# Patient Record
Sex: Female | Born: 1965 | Hispanic: No | Smoking: Never smoker
Health system: Southern US, Community
[De-identification: ages and names within clinical notes are randomized; demographics above are authoritative.]

## PROBLEM LIST (undated history)

## (undated) DIAGNOSIS — Z8489 Family history of other specified conditions: Secondary | ICD-10-CM

## (undated) DIAGNOSIS — T8859XA Other complications of anesthesia, initial encounter: Secondary | ICD-10-CM

## (undated) DIAGNOSIS — J45909 Unspecified asthma, uncomplicated: Secondary | ICD-10-CM

## (undated) DIAGNOSIS — M797 Fibromyalgia: Secondary | ICD-10-CM

## (undated) DIAGNOSIS — D691 Qualitative platelet defects: Secondary | ICD-10-CM

## (undated) DIAGNOSIS — K912 Postsurgical malabsorption, not elsewhere classified: Secondary | ICD-10-CM

## (undated) DIAGNOSIS — F419 Anxiety disorder, unspecified: Secondary | ICD-10-CM

## (undated) DIAGNOSIS — M359 Systemic involvement of connective tissue, unspecified: Secondary | ICD-10-CM

## (undated) DIAGNOSIS — R918 Other nonspecific abnormal finding of lung field: Secondary | ICD-10-CM

## (undated) DIAGNOSIS — J189 Pneumonia, unspecified organism: Secondary | ICD-10-CM

## (undated) DIAGNOSIS — F329 Major depressive disorder, single episode, unspecified: Secondary | ICD-10-CM

## (undated) DIAGNOSIS — G43509 Persistent migraine aura without cerebral infarction, not intractable, without status migrainosus: Secondary | ICD-10-CM

## (undated) DIAGNOSIS — D649 Anemia, unspecified: Secondary | ICD-10-CM

## (undated) DIAGNOSIS — F3181 Bipolar II disorder: Secondary | ICD-10-CM

## (undated) DIAGNOSIS — M1990 Unspecified osteoarthritis, unspecified site: Secondary | ICD-10-CM

## (undated) DIAGNOSIS — I10 Essential (primary) hypertension: Secondary | ICD-10-CM

## (undated) DIAGNOSIS — F32A Depression, unspecified: Secondary | ICD-10-CM

## (undated) HISTORY — PX: GASTRIC BYPASS: SHX52

## (undated) HISTORY — PX: LAPAROSCOPIC OOPHERECTOMY: SHX6507

## (undated) HISTORY — PX: TONSILLECTOMY: SUR1361

## (undated) HISTORY — PX: ABDOMINAL HYSTERECTOMY: SHX81

## (undated) HISTORY — PX: FOOT SURGERY: SHX648

## (undated) HISTORY — PX: HERNIA REPAIR: SHX51

## (undated) HISTORY — PX: ESOPHAGOGASTRODUODENOSCOPY: SHX1529

## (undated) HISTORY — PX: TUBAL LIGATION: SHX77

## (undated) HISTORY — PX: HIP SURGERY: SHX245

## (undated) HISTORY — PX: SHOULDER SURGERY: SHX246

## (undated) HISTORY — PX: KNEE SURGERY: SHX244

---

## 1997-09-24 DIAGNOSIS — C801 Malignant (primary) neoplasm, unspecified: Secondary | ICD-10-CM

## 1997-09-24 HISTORY — DX: Malignant (primary) neoplasm, unspecified: C80.1

## 2013-08-30 ENCOUNTER — Emergency Department: Payer: Self-pay | Admitting: Internal Medicine

## 2013-08-30 LAB — CBC
HCT: 34.4 % — ABNORMAL LOW (ref 35.0–47.0)
HGB: 11.3 g/dL — ABNORMAL LOW (ref 12.0–16.0)
MCH: 28.3 pg (ref 26.0–34.0)
MCHC: 32.8 g/dL (ref 32.0–36.0)
MCV: 86 fL (ref 80–100)
Platelet: 211 10*3/uL (ref 150–440)
RBC: 3.99 10*6/uL (ref 3.80–5.20)
RDW: 15.1 % — ABNORMAL HIGH (ref 11.5–14.5)
WBC: 6.5 10*3/uL (ref 3.6–11.0)

## 2013-08-30 LAB — PROTIME-INR
INR: 0.9
Prothrombin Time: 12.6 secs (ref 11.5–14.7)

## 2013-08-30 LAB — BASIC METABOLIC PANEL
Anion Gap: 6 — ABNORMAL LOW (ref 7–16)
BUN: 15 mg/dL (ref 7–18)
Calcium, Total: 8.6 mg/dL (ref 8.5–10.1)
Chloride: 103 mmol/L (ref 98–107)
Co2: 30 mmol/L (ref 21–32)
Creatinine: 0.71 mg/dL (ref 0.60–1.30)
EGFR (African American): >60
EGFR (Non-African Amer.): >60
Glucose: 91 mg/dL (ref 65–99)
Osmolality: 278 (ref 275–301)
Potassium: 4 mmol/L (ref 3.5–5.1)
Sodium: 139 mmol/L (ref 136–145)

## 2015-04-04 ENCOUNTER — Encounter: Payer: Self-pay | Admitting: Emergency Medicine

## 2015-04-04 ENCOUNTER — Ambulatory Visit
Admission: EM | Admit: 2015-04-04 | Discharge: 2015-04-04 | Disposition: A | Discharge status: Home / Self Care | Payer: Worker's Compensation | Attending: Internal Medicine | Admitting: Internal Medicine

## 2015-04-04 DIAGNOSIS — L089 Local infection of the skin and subcutaneous tissue, unspecified: Secondary | ICD-10-CM | POA: Diagnosis not present

## 2015-04-04 DIAGNOSIS — W57XXXA Bitten or stung by nonvenomous insect and other nonvenomous arthropods, initial encounter: Principal | ICD-10-CM

## 2015-04-04 DIAGNOSIS — J309 Allergic rhinitis, unspecified: Secondary | ICD-10-CM

## 2015-04-04 DIAGNOSIS — S80861A Insect bite (nonvenomous), right lower leg, initial encounter: Secondary | ICD-10-CM

## 2015-04-04 DIAGNOSIS — H6593 Unspecified nonsuppurative otitis media, bilateral: Secondary | ICD-10-CM

## 2015-04-04 HISTORY — DX: Fibromyalgia: M79.7

## 2015-04-04 HISTORY — DX: Major depressive disorder, single episode, unspecified: F32.9

## 2015-04-04 HISTORY — DX: Unspecified osteoarthritis, unspecified site: M19.90

## 2015-04-04 HISTORY — DX: Anxiety disorder, unspecified: F41.9

## 2015-04-04 HISTORY — DX: Depression, unspecified: F32.A

## 2015-04-04 HISTORY — DX: Bipolar II disorder: F31.81

## 2015-04-04 MED ORDER — FLUTICASONE PROPIONATE 50 MCG/ACT NA SUSP: 1.0000 | Freq: Two times a day (BID) | NASAL | Status: AC

## 2015-04-04 MED ORDER — MUPIROCIN 2 % EX OINT: 1.0000 "application " | TOPICAL_OINTMENT | Freq: Two times a day (BID) | CUTANEOUS | Status: DC

## 2015-04-04 MED ORDER — CEPHALEXIN 500 MG PO CAPS: 500.0000 mg | ORAL_CAPSULE | Freq: Two times a day (BID) | ORAL | Status: DC

## 2015-04-04 NOTE — Discharge Instructions (Signed)
Allergic Rhinitis Allergic rhinitis is when the mucous membranes in the nose respond to allergens. Allergens are particles in the air that cause your body to have an allergic reaction. This causes you to release allergic antibodies. Through a chain of events, these eventually cause you to release histamine into the blood stream. Although meant to protect the body, it is this release of histamine that causes your discomfort, such as frequent sneezing, congestion, and an itchy, runny nose.  CAUSES  Seasonal allergic rhinitis (hay fever) is caused by pollen allergens that may come from grasses, trees, and weeds. Year-round allergic rhinitis (perennial allergic rhinitis) is caused by allergens such as house dust mites, pet dander, and mold spores.  SYMPTOMS   Nasal stuffiness (congestion).  Itchy, runny nose with sneezing and tearing of the eyes. DIAGNOSIS  Your health care provider can help you determine the allergen or allergens that trigger your symptoms. If you and your health care provider are unable to determine the allergen, skin or blood testing may be used. TREATMENT  Allergic rhinitis does not have a cure, but it can be controlled by:  Medicines and allergy shots (immunotherapy).  Avoiding the allergen. Hay fever may often be treated with antihistamines in pill or nasal spray forms. Antihistamines block the effects of histamine. There are over-the-counter medicines that may help with nasal congestion and swelling around the eyes. Check with your health care provider before taking or giving this medicine.  If avoiding the allergen or the medicine prescribed do not work, there are many new medicines your health care provider can prescribe. Stronger medicine may be used if initial measures are ineffective. Desensitizing injections can be used if medicine and avoidance does not work. Desensitization is when a patient is given ongoing shots until the body becomes less sensitive to the allergen.  Make sure you follow up with your health care provider if problems continue. HOME CARE INSTRUCTIONS It is not possible to completely avoid allergens, but you can reduce your symptoms by taking steps to limit your exposure to them. It helps to know exactly what you are allergic to so that you can avoid your specific triggers. SEEK MEDICAL CARE IF:   You have a fever.  You develop a cough that does not stop easily (persistent).  You have shortness of breath.  You start wheezing.  Symptoms interfere with normal daily activities. Document Released: 06/05/2001 Document Revised: 09/15/2013 Document Reviewed: 05/18/2013 [facility] Patient Information 2015 [place], [place]. This information is not intended to replace advice given to you by your health care provider. Make sure you discuss any questions you have with your health care provider. Otitis Media With Effusion Otitis media with effusion is the presence of fluid in the middle ear. This is a common problem in children, which often follows ear infections. It may be present for weeks or longer after the infection. Unlike an acute ear infection, otitis media with effusion refers only to fluid behind the ear drum and not infection. Children with repeated ear and sinus infections and allergy problems are the most likely to get otitis media with effusion. CAUSES  The most frequent cause of the fluid buildup is dysfunction of the eustachian tubes. These are the tubes that drain fluid in the ears to the back of the nose (nasopharynx). SYMPTOMS   The main symptom of this condition is hearing loss. As a result, you or your child may:  Listen to the TV at a loud volume.  Not respond to questions.  Ask "  what" often when spoken to.  Mistake or confuse one sound or word for another.  There may be a sensation of fullness or pressure but usually not pain. DIAGNOSIS   Your health care provider will diagnose this condition by examining you or your  child's ears.  Your health care provider may test the pressure in you or your child's ear with a tympanometer.  A hearing test may be conducted if the problem persists. TREATMENT   Treatment depends on the duration and the effects of the effusion.  Antibiotics, decongestants, nose drops, and cortisone-type drugs (tablets or nasal spray) may not be helpful.  Children with persistent ear effusions may have delayed language or behavioral problems. Children at risk for developmental delays in hearing, learning, and speech may require referral to a specialist earlier than children not at risk.  You or your child's health care provider may suggest a referral to an ear, nose, and throat surgeon for treatment. The following may help restore normal hearing:  Drainage of fluid.  Placement of ear tubes (tympanostomy tubes).  Removal of adenoids (adenoidectomy). HOME CARE INSTRUCTIONS   Avoid secondhand smoke.  Infants who are breastfed are less likely to have this condition.  Avoid feeding infants while they are lying flat.  Avoid known environmental allergens.  Avoid people who are sick. SEEK MEDICAL CARE IF:   Hearing is not better in 3 months.  Hearing is worse.  Ear pain.  Drainage from the ear.  Dizziness. MAKE SURE YOU:   Understand these instructions.  Will watch your condition.  Will get help right away if you are not doing well or get worse. Document Released: 10/18/2004 Document Revised: 01/25/2014 Document Reviewed: 04/07/2013 [facility] Patient Information 2015 [place], [place]. This information is not intended to replace advice given to you by your health care provider. Make sure you discuss any questions you have with your health care provider. Cellulitis Cellulitis is an infection of the skin and the tissue beneath it. The infected area is usually red and tender. Cellulitis occurs most often in the arms and lower legs.  CAUSES  Cellulitis is caused by bacteria  that enter the skin through cracks or cuts in the skin. The most common types of bacteria that cause cellulitis are staphylococci and streptococci. SIGNS AND SYMPTOMS   Redness and warmth.  Swelling.  Tenderness or pain.  Fever. DIAGNOSIS  Your health care provider can usually determine what is wrong based on a physical exam. Blood tests may also be done. TREATMENT  Treatment usually involves taking an antibiotic medicine. HOME CARE INSTRUCTIONS   Take your antibiotic medicine as directed by your health care provider. Finish the antibiotic even if you start to feel better.  Keep the infected arm or leg elevated to reduce swelling.  Apply a warm cloth to the affected area up to 4 times per day to relieve pain.  Take medicines only as directed by your health care provider.  Keep all follow-up visits as directed by your health care provider. SEEK MEDICAL CARE IF:   You notice red streaks coming from the infected area.  Your red area gets larger or turns dark in color.  Your bone or joint underneath the infected area becomes painful after the skin has healed.  Your infection returns in the same area or another area.  You notice a swollen bump in the infected area.  You develop new symptoms.  You have a fever. SEEK IMMEDIATE MEDICAL CARE IF:   You feel very sleepy.  You develop vomiting or diarrhea.  You have a general ill feeling (malaise) with muscle aches and pains. MAKE SURE YOU:   Understand these instructions.  Will watch your condition.  Will get help right away if you are not doing well or get worse. Document Released: 06/20/2005 Document Revised: 01/25/2014 Document Reviewed: 11/26/2011 [facility] Patient Information 2015 [place], [place]. This information is not intended to replace advice given to you by your health care provider. Make sure you discuss any questions you have with your health care provider. Bee, Wasp, or Hornet Sting Your caregiver has  diagnosed you as having an insect sting. An insect sting appears as a red lump in the skin that sometimes has a tiny hole in the center, or it may have a stinger in the center of the wound. The most common stings are from wasps, hornets and bees. Individuals have different reactions to insect stings.  A normal reaction may cause pain, swelling, and redness around the sting site.  A localized allergic reaction may cause swelling and redness that extends beyond the sting site.  A large local reaction may continue to develop over the next 12 to 36 hours.  On occasion, the reactions can be severe (anaphylactic reaction). An anaphylactic reaction may cause wheezing; difficulty breathing; chest pain; fainting; raised, itchy, red patches on the skin; a sick feeling to your stomach (nausea); vomiting; cramping; or diarrhea. If you have had an anaphylactic reaction to an insect sting in the past, you are more likely to have one again. HOME CARE INSTRUCTIONS   With bee stings, a small sac of poison is left in the wound. Brushing across this with something such as a credit card, or anything similar, will help remove this and decrease the amount of the reaction. This same procedure will not help a wasp sting as they do not leave behind a stinger and poison sac.  Apply a cold compress for 10 to 20 minutes every hour for 1 to 2 days, depending on severity, to reduce swelling and itching.  To lessen pain, a paste made of water and baking soda may be rubbed on the bite or sting and left on for 5 minutes.  To relieve itching and swelling, you may use take medication or apply medicated creams or lotions as directed.  Only take over-the-counter or prescription medicines for pain, discomfort, or fever as directed by your caregiver.  Wash the sting site daily with soap and water. Apply antibiotic ointment on the sting site as directed.  If you suffered a severe reaction:  If you did not require hospitalization,  an adult will need to stay with you for 24 hours in case the symptoms return.  You may need to wear a medical bracelet or necklace stating the allergy.  You and your family need to learn when and how to use an anaphylaxis kit or epinephrine injection.  If you have had a severe reaction before, always carry your anaphylaxis kit with you. SEEK MEDICAL CARE IF:   None of the above helps within 2 to 3 days.  The area becomes red, warm, tender, and swollen beyond the area of the bite or sting.  You have an oral temperature above 102 F (38.9 C). SEEK IMMEDIATE MEDICAL CARE IF:  You have symptoms of an allergic reaction which are:  Wheezing.  Difficulty breathing.  Chest pain.  Lightheadedness or fainting.  Itchy, raised, red patches on the skin.  Nausea, vomiting, cramping or diarrhea. ANY OF THESE SYMPTOMS MAY  REPRESENT A SERIOUS PROBLEM THAT IS AN EMERGENCY. Do not wait to see if the symptoms will go away. Get medical help right away. Call your local emergency services (911 in [place] DO NOT drive yourself to the hospital. MAKE SURE YOU:   Understand these instructions.  Will watch your condition.  Will get help right away if you are not doing well or get worse. Document Released: 09/10/2005 Document Revised: 12/03/2011 Document Reviewed: 02/25/2010 [facility] Patient Information 2015 [place], [place]. This information is not intended to replace advice given to you by your health care provider. Make sure you discuss any questions you have with your health care provider.

## 2015-04-04 NOTE — ED Provider Notes (Addendum)
CSN: [id number]     Arrival date & time 04/04/15  1311 History   First MD Initiated Contact with Patient 04/04/15 1409     Chief Complaint  Patient presents with  . Insect Bite   (Consider location/radiation/quality/duration/timing/severity/associated sxs/prior Treatment) HPI Comments: African american female stung by wasp at work last week.  Immediately cleaned right lower leg with insect sting wipe out of first aid kit.  First ever wasp sting.  Has had a lot of pain worsening erythema, still itching despite benadryl po or cream application.  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    Past Medical History  Diagnosis Date  . Arthritis   . Fibromyalgia   . Depression   . Bipolar 2 disorder   . Anxiety    Past Surgical History  Procedure Laterality Date  . Tonsillectomy    . Cesarean section    . Tubal ligation    . Abdominal hysterectomy    . Laparoscopic oopherectomy    . Foot surgery Bilateral   . Knee surgery Bilateral   . Hip surgery    . Shoulder surgery Bilateral   . Gastric bypass     History reviewed. No pertinent family history. History  Substance Use Topics  . Smoking status: Never Smoker   . Smokeless tobacco: Never Used  . Alcohol Use: Yes   OB History    No data available     Review of Systems  Constitutional: Negative for fever, chills, diaphoresis, activity change, appetite change and fatigue.  HENT: Positive for sore throat. Negative for facial swelling.   Eyes: Negative for photophobia, pain, discharge, redness, itching and visual disturbance.  Respiratory: Negative for cough, choking, chest tightness, shortness of breath, wheezing and stridor.   Cardiovascular: Negative for chest pain, palpitations and leg swelling.  Gastrointestinal: Negative for nausea, vomiting, abdominal pain, diarrhea, constipation, blood in stool and abdominal distention.  Endocrine: Negative for cold  intolerance and heat intolerance.  Genitourinary: Negative for dysuria and hematuria.  Musculoskeletal: Positive for myalgias. Negative for back pain, joint swelling, arthralgias, gait problem, neck pain and neck stiffness.  Skin: Positive for color change and rash. Negative for pallor and wound.  Allergic/Immunologic: Positive for environmental allergies. Negative for food allergies.  Neurological: Negative for dizziness, tremors, seizures, syncope, facial asymmetry, speech difficulty, weakness, light-headedness, numbness and headaches.  Hematological: Negative for adenopathy. Does not bruise/bleed easily.  Psychiatric/Behavioral: Negative for behavioral problems, confusion, sleep disturbance and agitation.    Allergies  Bactrim; Ambien; Depakene; Flagyl; Lyrica; Percocet; Compazine; and Lamictal  Home Medications   Prior to Admission medications   Medication Sig Start Date End Date Taking? Authorizing Provider  acetaminophen (TYLENOL) 650 MG CR tablet Take 650 mg by mouth every 8 (eight) hours as needed for pain.   Yes Historical Provider, MD  albuterol (PROVENTIL HFA;VENTOLIN HFA) 108 (90 BASE) MCG/ACT inhaler Inhale 2 puffs into the lungs every 6 (six) hours as needed for wheezing or shortness of breath.   Yes Historical Provider, MD  ALPRAZolam Duanne Moron) 0.5 MG tablet Take 0.5 mg by mouth 2 (two) times daily as needed for anxiety.   Yes Historical Provider, MD  aspirin 325 MG tablet Take 325 mg by mouth daily.   Yes Historical Provider, MD  clonazePAM (KLONOPIN) 0.5 MG tablet Take 1 mg by mouth at bedtime.   Yes Historical Provider, MD  DULoxetine (CYMBALTA) 60 MG capsule Take 120 mg by mouth  daily.   Yes Historical Provider, MD  fluticasone-salmeterol (ADVAIR HFA) 115-21 MCG/ACT inhaler Inhale 2 puffs into the lungs 2 (two) times daily.   Yes Historical Provider, MD  gabapentin (NEURONTIN) 300 MG capsule Take 300 mg by mouth 3 (three) times daily.   Yes Historical Provider, MD    loratadine (CLARITIN) 10 MG tablet Take 10 mg by mouth daily.   Yes Historical Provider, MD  Melatonin 3 MG CAPS Take 1 capsule by mouth at bedtime.   Yes Historical Provider, MD  meloxicam (MOBIC) 15 MG tablet Take 15 mg by mouth at bedtime.   Yes Historical Provider, MD  Multiple Vitamin (MULTIVITAMIN) tablet Take 1 tablet by mouth daily.   Yes Historical Provider, MD  ondansetron (ZOFRAN-ODT) 4 MG disintegrating tablet Take 4 mg by mouth every 8 (eight) hours as needed for nausea or vomiting.   Yes Historical Provider, MD  Oxcarbazepine (TRILEPTAL) 300 MG tablet Take 300 mg by mouth 2 (two) times daily with breakfast and lunch.   Yes Historical Provider, MD  oxcarbazepine (TRILEPTAL) 600 MG tablet Take 600 mg by mouth at bedtime.   Yes Historical Provider, MD  progesterone (PROMETRIUM) 100 MG capsule Take 100 mg by mouth daily.   Yes Historical Provider, MD  ranitidine (ZANTAC) 150 MG tablet Take 150 mg by mouth 2 (two) times daily.   Yes Historical Provider, MD  traMADol (ULTRAM) 50 MG tablet Take 50 mg by mouth every 6 (six) hours as needed.   Yes Historical Provider, MD  vitamin B-12 (CYANOCOBALAMIN) 1000 MCG tablet Take 1,000 mcg by mouth daily.   Yes Historical Provider, MD  cephALEXin (KEFLEX) 500 MG capsule Take 1 capsule (500 mg total) by mouth 2 (two) times daily. 04/04/15   Olen Cordial, NP  fluticasone (FLONASE) 50 MCG/ACT nasal spray Place 1 spray into both nostrils 2 (two) times daily. 04/04/15   Olen Cordial, NP  mupirocin ointment (BACTROBAN) 2 % Apply 1 application topically 2 (two) times daily. 04/04/15   Aura Fey Adea Geisel, NP   BP 113/75 mmHg  Pulse 74  Temp(Src) 97.8 F (36.6 C) (Tympanic)  Resp 16  Ht 5' 4"  (1.626 m)  Wt 200 lb (90.719 kg)  BMI 34.31 kg/m2  SpO2 100% Physical Exam  Constitutional: She is oriented to person, place, and time. Vital signs are normal. She appears well-developed and well-nourished. No distress.  HENT:  Head: Normocephalic and  atraumatic.  Right Ear: Hearing, external ear and ear canal normal. A middle ear effusion is present.  Left Ear: Hearing, external ear and ear canal normal. A middle ear effusion is present.  Nose: Mucosal edema and rhinorrhea present. Right sinus exhibits no maxillary sinus tenderness and no frontal sinus tenderness. Left sinus exhibits no maxillary sinus tenderness and no frontal sinus tenderness.  Mouth/Throat: Uvula is midline and mucous membranes are normal. She does not have dentures. No oral lesions. No trismus in the jaw. Normal dentition. No dental abscesses, uvula swelling, lacerations or dental caries. Posterior oropharyngeal edema and posterior oropharyngeal erythema present. No oropharyngeal exudate or tonsillar abscesses.  Cobblestoning posteior pharynx; bilateral TMs with air fluid level; bilateral nasal turbinates with edema/erythema  Eyes: Conjunctivae, EOM and lids are normal. Pupils are equal, round, and reactive to light. Right eye exhibits no discharge. Left eye exhibits no discharge. No scleral icterus.  Neck: Trachea normal and normal range of motion. Neck supple. No tracheal deviation present. No thyromegaly present.  Cardiovascular: Normal rate, regular rhythm, normal heart sounds and intact  distal pulses.  Exam reveals no gallop and no friction rub.   No murmur heard. Pulmonary/Chest: Effort normal and breath sounds normal. No stridor. No respiratory distress. She has no wheezes. She has no rales. She exhibits no tenderness.  Abdominal: Soft. She exhibits no distension.  Musculoskeletal: Normal range of motion. She exhibits edema and tenderness.  Neurological: She is alert and oriented to person, place, and time. She exhibits normal muscle tone. Coordination normal.  Skin: Skin is warm, dry and intact. Rash noted. No abrasion, no bruising, no burn, no ecchymosis, no laceration, no petechiae and no purpura noted. Rash is macular. Rash is not papular, not maculopapular, not  nodular, not pustular, not vesicular and not urticarial. She is not diaphoretic. There is erythema. No cyanosis. No pallor. Nails show no clubbing.     TTP slight edema 0-1+/4  Psychiatric: She has a normal mood and affect. Her speech is normal and behavior is normal. Judgment and thought content normal. Cognition and memory are normal.  Nursing note and vitals reviewed.   ED Course  Procedures (including critical care time) Labs Review Labs Reviewed - No data to display  Imaging Review No results found.   MDM   1. Insect bite of leg, infected, right, initial encounter   2. Allergic rhinitis, unspecified allergic rhinitis type   3. Otitis media with effusion, bilateral    Will treat for cellulitis wasp sting right lower leg.  Exitcare handout on skin infection given to patient.  RTC if worsening erythema, pain, purulent discharge, fever after 48 hours of antibiotics.  Patient verbalized understanding, agreed with plan of care and had no further questions at this time.    Patient may use normal saline nasal spray as needed.  Restart flonase refilled.  Continue oral antihistamine.  Restart nasal saline.  Avoid triggers if possible.  Shower prior to bedtime if exposed to triggers.  If allergic dust/dust mites recommend mattress/pillow covers/encasements; washing linens, vacuuming, sweeping, dusting weekly.  Call or return to clinic as needed if these symptoms worsen or fail to improve as anticipated.   Exitcare handout on allergic rhinitis given to patient.  Patient verbalized understanding of instructions, agreed with plan of care and had no further questions at this time.  P2:  Avoidance and hand washing.  Supportive treatment.   No evidence of invasive bacterial infection, non toxic and well hydrated.  This is most likely self limiting viral infection.  I do not see where any further testing or imaging is necessary at this time.   I will suggest supportive care, rest, good hygiene and  encourage the patient to take adequate fluids.  The patient is to return to clinic or EMERGENCY ROOM if symptoms worsen or change significantly e.g. ear pain, fever, purulent discharge from ears or bleeding.  Exitcare handout on otitis media with effusion given to patient.  Patient verbalized agreement and understanding of treatment plan.     Olen Cordial, NP 04/04/15 1455  Patient contacted clinic requesting refill on flonase 1 spray each nostril BID.  Rx faxed to [facility] for flonase 1 spray each nostril BID #1 16gm bottle RF0 and patient notified via telephone she would need to follow up with [facility] for any further refills or be seen at [facility] again.  Patient reported her symptoms have improved on nasal steroid spray and she would like to continue therapy and will schedule follow up with her PCM.  Patient had no further questions at this time, verbalized understanding of information/instructions  and agreed with plan of care.  Olen Cordial, NP 05/06/15 1340

## 2015-04-04 NOTE — ED Notes (Signed)
Patient states that she got stung by a wasp on her right ankle at work on Monday a week ago.

## 2016-03-09 ENCOUNTER — Ambulatory Visit
Admission: EM | Admit: 2016-03-09 | Discharge: 2016-03-09 | Disposition: A | Discharge status: Home / Self Care | Payer: BLUE CROSS/BLUE SHIELD | Attending: Family Medicine | Admitting: Family Medicine

## 2016-03-09 DIAGNOSIS — M25562 Pain in left knee: Secondary | ICD-10-CM | POA: Diagnosis not present

## 2016-03-09 DIAGNOSIS — M25561 Pain in right knee: Secondary | ICD-10-CM | POA: Diagnosis not present

## 2016-03-09 DIAGNOSIS — G8929 Other chronic pain: Secondary | ICD-10-CM

## 2016-03-09 MED ORDER — HYDROCODONE-ACETAMINOPHEN 5-325 MG PO TABS: 1.0000 | ORAL_TABLET | Freq: Three times a day (TID) | ORAL | Status: DC | PRN

## 2016-03-09 MED ORDER — KETOROLAC TROMETHAMINE 60 MG/2ML IM SOLN
60.0000 mg | Freq: Once | INTRAMUSCULAR | Status: AC
  Administered 2016-03-09: 60 mg via INTRAMUSCULAR

## 2016-03-09 NOTE — ED Notes (Signed)
Patients presents with bilateral knee pain and swelling that has been on going for awhile and she states she isn't able to control her pain with current meds.

## 2016-03-09 NOTE — ED Provider Notes (Signed)
[facility]  ____________________________________________  Time seen: Approximately 7:31 PM  I have reviewed the triage vital signs and the nursing notes.   HISTORY  Chief Complaint Knee Pain  HPI Aritha Mostyn is a 50 y.o. female presents for complaints of bilateral knee pain for several months. Patient reports she has a history of chronic bilateral knee pain that she has had for years, and reports she has had gradual increase in pain over last 3-4 months. Patient reports that her knee pain is unresolved with her current chronic pain regimen including oral tramadol. Patient denies any fall or recent injury. Patient states pain is primarily in bilateral medial knees. States overall the knee pain is equal in bilateral knees and described as a throbbing aching pain. Patient reports this is similar to her chronic knee pain.  Patient again denies any fall, trauma or recent injury. Denies abrupt onset or change. Reports pain has been gradual. Denies posterior knee or calf pain. Denies extremity swelling, unilateral swelling, recent immobilization, recent surgeries or recent long trips. Denies chest pain, shortness of breath, abdominal pain, dysuria, fevers, recent sickness or other complaints.  Patient reports has been previously following with orthopedic at [facility] in which she was previously receiving knee joint injections. 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   Past Medical History  Diagnosis Date  . Arthritis   . Fibromyalgia   . Depression   . Bipolar 2 disorder ([place])   . Anxiety     There are no active problems to display for this patient.   Past Surgical History  Procedure Laterality Date  . Tonsillectomy    . Cesarean section    . Tubal ligation    . Abdominal hysterectomy    . Laparoscopic oopherectomy    . Foot surgery Bilateral   . Knee surgery Bilateral   . Hip surgery    . Shoulder surgery Bilateral   . Gastric bypass     . Esophagogastroduodenoscopy    . Hernia repair      Current Outpatient Rx  Name  Route  Sig  Dispense  Refill  . acetaminophen (TYLENOL) 650 MG CR tablet   Oral   Take 650 mg by mouth every 8 (eight) hours as needed for pain.         Marland Kitchen albuterol (PROVENTIL HFA;VENTOLIN HFA) 108 (90 BASE) MCG/ACT inhaler   Inhalation   Inhale 2 puffs into the lungs every 6 (six) hours as needed for wheezing or shortness of breath.         . ALPRAZolam (XANAX) 0.5 MG tablet   Oral   Take 0.5 mg by mouth 2 (two) times daily as needed for anxiety.         . clonazePAM (KLONOPIN) 0.5 MG tablet   Oral   Take 1 mg by mouth at bedtime.         . DULoxetine (CYMBALTA) 60 MG capsule   Oral   Take 120 mg by mouth daily.         . fluticasone (FLONASE) 50 MCG/ACT nasal spray   Each Nare   Place 1 spray into both nostrils 2 (two) times daily.   16 g   0   . gabapentin (NEURONTIN) 300 MG capsule   Oral   Take 300 mg by mouth 3 (three) times daily.         . Melatonin 3 MG CAPS   Oral   Take 1 capsule by mouth  at bedtime.         . mometasone-formoterol (DULERA) 100-5 MCG/ACT AERO   Inhalation   Inhale 2 puffs into the lungs 2 (two) times daily.         . Multiple Vitamin (MULTIVITAMIN) tablet   Oral   Take 1 tablet by mouth daily.         .           .           . Oxcarbazepine (TRILEPTAL) 300 MG tablet   Oral   Take 300 mg by mouth 2 (two) times daily with breakfast and lunch.         . oxcarbazepine (TRILEPTAL) 600 MG tablet   Oral   Take 600 mg by mouth at bedtime.         . progesterone (PROMETRIUM) 100 MG capsule   Oral   Take 100 mg by mouth daily.         . ranitidine (ZANTAC) 150 MG tablet   Oral   Take 150 mg by mouth 2 (two) times daily.                    Marland Kitchen aspirin 325 MG tablet   Oral   Take 325 mg by mouth daily.         .           . fluticasone-salmeterol (ADVAIR HFA) 115-21 MCG/ACT inhaler   Inhalation   Inhale 2 puffs into the  lungs 2 (two) times daily.         Marland Kitchen loratadine (CLARITIN) 10 MG tablet   Oral   Take 10 mg by mouth daily.         .           . vitamin B-12 (CYANOCOBALAMIN) 1000 MCG tablet   Oral   Take 1,000 mcg by mouth daily.           Allergies Bactrim; Ambien; Depakene; Flagyl; Lyrica; Percocet; Compazine; and Lamictal  History reviewed. No pertinent family history.  Social History Social History  Substance Use Topics  . Smoking status: Never Smoker   . Smokeless tobacco: Never Used  . Alcohol Use: Yes    Review of Systems Constitutional: No fever/chills Eyes: No visual changes. ENT: No sore throat. Cardiovascular: Denies chest pain. Respiratory: Denies shortness of breath. Gastrointestinal: No abdominal pain.  No nausea, no vomiting.  No diarrhea.  No constipation. Genitourinary: Negative for dysuria. Musculoskeletal: Negative for back pain. As above. Skin: Negative for rash. Neurological: Negative for headaches, focal weakness or numbness.  10-point ROS otherwise negative.  ____________________________________________   PHYSICAL EXAM:  VITAL SIGNS: ED Triage Vitals  Enc Vitals Group     BP 03/09/16 1856 117/42 mmHg     Pulse Rate 03/09/16 1856 67     Resp 03/09/16 1856 16     Temp 03/09/16 1856 97.5 F (36.4 C)     Temp Source 03/09/16 1856 Oral     SpO2 03/09/16 1856 100 %     Weight 03/09/16 1856 195 lb (88.451 kg)     Height 03/09/16 1856 5\' 3"  (1.6 m)     Head Cir --      Peak Flow --      Pain Score 03/09/16 1905 9     Pain Loc --      Pain Edu? --      Excl. in [place]? --     Constitutional: Alert and oriented.  Well appearing and in no acute distress. Eyes: Conjunctivae are normal. PERRL. EOMI. Head: Atraumatic.  Mouth/Throat: Mucous membranes are moist.   Neck: No stridor.  No cervical spine tenderness to palpation. Hematological/Lymphatic/Immunilogical: No cervical lymphadenopathy. Cardiovascular: Normal rate, regular rhythm. Grossly normal  heart sounds.  Good peripheral circulation. Respiratory: Normal respiratory effort.  No retractions. Lungs CTAB. Gastrointestinal: Soft and nontender. Obese abdomen.  Musculoskeletal: No lower or upper extremity tenderness nor edema.   Bilateral pedal pulses equal and easily palpated.  Except: Bilateral anterior medial knees moderate tenderness to palpation, no swelling, full range of motion, bilateral pain with anterior drawer test, no pain posterior drawer test, mild pain with medial stress, no pain with lateral stress, full range of motion, mild pain with resisted knee extension and none with flexion bilaterally, no posterior calf tenderness, no lower extremity swelling, bilateral pedal pulses equal and easily palpated. Ambulatory with steady gait. Changes positions quickly. Neurologic:  Normal speech and language. No gross focal neurologic deficits are appreciated. No gait instability. Skin:  Skin is warm, dry and intact. No rash noted. Psychiatric: Mood and affect are normal. Speech and behavior are normal.  ____________________________________________   LABS (all labs ordered are listed, but only abnormal results are displayed)  Labs Reviewed - No data to display ____________________________________________  RADIOLOGY  No results found. __  Able to review the right and left knee x-rays via epic [place] at that from November 2016. Via epic radiology reports: left knee no acute fracture or dislocation, mild medial compartment joint space narrowing, with mild tricompartmental marginal osteophytosis and small joint effusion; and right knee no acute fracture or dislocation, joint spaces are relatively well-maintained, mild tricompartmental marginal osteophytosis, small joint effusion.     __________________________________________   PROCEDURES  Procedure(s) performed:  Denies need for splinting or crutches or walker.   ______________________________   INITIAL IMPRESSION /  ASSESSMENT AND PLAN / ED COURSE  Pertinent labs & imaging results that were available during my care of the patient were reviewed by me and considered in my medical decision making (see chart for details).  very well-appearing patient. No acute distress. Presents ambulatory and presents for the complaints of acute on chronic bilateral knee pain. Denies trauma or recent injury. Bilateral medial tenderness.suspect acute on chronic pain. As patient with current tramadol at home and gastric bypass discussed treatment options, will treat patient with 60 mg IM Toradol 1 and quantity #2 Norco given for breakthrough pain and directed to not take additionally with other pain medicine including within 8 hours of tramadol. Patient directed to follow-up with her orthopedist information for local orthopedic also given. Follow primary care physician as needed. Avoid strenuous activity, ice, elevation and Follow-up.    Discussed follow up with Primary care physician this week. Discussed follow up and return parameters including no resolution or any worsening concerns. Patient verbalized understanding and agreed to plan.   ____________________________________________   FINAL CLINICAL IMPRESSION(S) / ED DIAGNOSES  Final diagnoses:  Bilateral chronic knee pain     Discharge Medication List as of 03/09/2016  7:48 PM    START taking these medications   Details  HYDROcodone-acetaminophen (NORCO/VICODIN) 5-325 MG tablet Take 1 tablet by mouth every 8 (eight) hours as needed for moderate pain or severe pain (do not drive or operate machinery while taking as can cause drowsiness. Do not take with additional pain medication.)., Starting 03/09/2016, Until Discontinued, Print        Note: This dictation was prepared with Dragon dictation  along with smaller phrase technology. Any transcriptional errors that result from this process are unintentional.       Marylene Land, NP 03/11/16 2318

## 2016-03-09 NOTE — Discharge Instructions (Signed)
Take medication as prescribed. Rest. Ice and elevate.   Follow up with orthopedic next week for continued pain.   Follow up with your primary care physician this week as needed. Return to Urgent care for new or worsening concerns.    Knee Pain Knee pain is a common problem. It can have many causes. The pain often goes away by following your doctor's home care instructions. Treatment for ongoing pain will depend on the cause of your pain. If your knee pain continues, more tests may be needed to diagnose your condition. Tests may include X-rays or other imaging studies of your knee. HOME CARE  Take medicines only as told by your doctor.  Rest your knee and keep it raised (elevated) while you are resting.  Do not do things that cause pain or make your pain worse.  Avoid activities where both feet leave the ground at the same time, such as running, jumping rope, or doing jumping jacks.  Apply ice to the knee area:  Put ice in a plastic bag.  Place a towel between your skin and the bag.  Leave the ice on for 20 minutes, 2-3 times a day.  Ask your doctor if you should wear an elastic knee support.  Sleep with a pillow under your knee.  Lose weight if you are overweight. Being overweight can make your knee hurt more.  Do not use any tobacco products, including cigarettes, chewing tobacco, or electronic cigarettes. If you need help quitting, ask your doctor. Smoking may slow the healing of any bone and joint problems that you may have. GET HELP IF:  Your knee pain does not stop, it changes, or it gets worse.  You have a fever along with knee pain.  Your knee gives out or locks up.  Your knee becomes more swollen. GET HELP RIGHT AWAY IF:   Your knee feels hot to the touch.  You have chest pain or trouble breathing.   This information is not intended to replace advice given to you by your health care provider. Make sure you discuss any questions you have with your health care  provider.   Document Released: 12/07/2008 Document Revised: 10/01/2014 Document Reviewed: 11/11/2013 Elsevier Interactive Patient Education 2016 [employer]  Chronic Pain Chronic pain can be defined as pain that is off and on and lasts for 3-6 months or longer. Many things cause chronic pain, which can make it difficult to make a diagnosis. There are many treatment options available for chronic pain. However, finding a treatment that works well for you may require trying various approaches until the right one is found. Many people benefit from a combination of two or more types of treatment to control their pain. SYMPTOMS  Chronic pain can occur anywhere in the body and can range from mild to very severe. Some types of chronic pain include:  Headache.  Low back pain.  Cancer pain.  Arthritis pain.  Neurogenic pain. This is pain resulting from damage to nerves. People with chronic pain may also have other symptoms such as:  Depression.  Anger.  Insomnia.  Anxiety. DIAGNOSIS  Your health care provider will help diagnose your condition over time. In many cases, the initial focus will be on excluding possible conditions that could be causing the pain. Depending on your symptoms, your health care provider may order tests to diagnose your condition. Some of these tests may include:   Blood tests.   CT scan.   MRI.   X-rays.  Ultrasounds.   Nerve conduction studies.  You may need to see a specialist.  TREATMENT  Finding treatment that works well may take time. You may be referred to a pain specialist. He or she may prescribe medicine or therapies, such as:   Mindful meditation or yoga.  Shots (injections) of numbing or pain-relieving medicines into the spine or area of pain.  Local electrical stimulation.  Acupuncture.   Massage therapy.   Aroma, color, light, or sound therapy.   Biofeedback.   Working with a physical therapist to keep from getting  stiff.   Regular, gentle exercise.   Cognitive or behavioral therapy.   Group support.  Sometimes, surgery may be recommended.  HOME CARE INSTRUCTIONS   Take all medicines as directed by your health care provider.   Lessen stress in your life by relaxing and doing things such as listening to calming music.   Exercise or be active as directed by your health care provider.   Eat a healthy diet and include things such as vegetables, fruits, fish, and lean meats in your diet.   Keep all follow-up appointments with your health care provider.   Attend a support group with others suffering from chronic pain. SEEK MEDICAL CARE IF:   Your pain gets worse.   You develop a new pain that was not there before.   You cannot tolerate medicines given to you by your health care provider.   You have new symptoms since your last visit with your health care provider.  SEEK IMMEDIATE MEDICAL CARE IF:   You feel weak.   You have decreased sensation or numbness.   You lose control of bowel or bladder function.   Your pain suddenly gets much worse.   You develop shaking.  You develop chills.  You develop confusion.  You develop chest pain.  You develop shortness of breath.  MAKE SURE YOU:  Understand these instructions.  Will watch your condition.  Will get help right away if you are not doing well or get worse.   This information is not intended to replace advice given to you by your health care provider. Make sure you discuss any questions you have with your health care provider.   Document Released: 06/02/2002 Document Revised: 05/13/2013 Document Reviewed: 03/06/2013 Elsevier Interactive Patient Education [employer].

## 2016-06-29 ENCOUNTER — Ambulatory Visit
Admission: EM | Admit: 2016-06-29 | Discharge: 2016-06-29 | Disposition: A | Discharge status: Home / Self Care | Payer: BLUE CROSS/BLUE SHIELD | Attending: Family Medicine | Admitting: Family Medicine

## 2016-06-29 ENCOUNTER — Encounter: Payer: Self-pay | Admitting: *Deleted

## 2016-06-29 DIAGNOSIS — G43909 Migraine, unspecified, not intractable, without status migrainosus: Secondary | ICD-10-CM | POA: Diagnosis not present

## 2016-06-29 HISTORY — DX: Persistent migraine aura without cerebral infarction, not intractable, without status migrainosus: G43.509

## 2016-06-29 MED ORDER — KETOROLAC TROMETHAMINE 60 MG/2ML IM SOLN
60.0000 mg | Freq: Once | INTRAMUSCULAR | Status: AC
  Administered 2016-06-29: 60 mg via INTRAMUSCULAR

## 2016-06-29 MED ORDER — ONDANSETRON 8 MG PO TBDP
8.0000 mg | ORAL_TABLET | Freq: Once | ORAL | Status: AC
  Administered 2016-06-29: 8 mg via ORAL

## 2016-06-29 NOTE — Discharge Instructions (Signed)
Avoid triggers. Rest. Drink plenty of fluids.   Follow up with your primary care physician this week. Return to Urgent care or ER for new or worsening concerns.

## 2016-06-29 NOTE — ED Provider Notes (Signed)
[facility] ____________________________________________  Time seen: Approximately 8:10 PM  I have reviewed the triage vital signs and the nursing notes.   HISTORY  Chief Complaint Migraine   HPI Kimberly Herring is a 50 y.o. female patient with multiple comorbidities, presenting for complaint of migraine. Patient reports that she has had migraines since age 14. Patient reports that she often has multiple headaches that last greater than 10 hours every month. Patient states her migraines usually last several days. Patient reports current migraine headache started this past Saturday which was present upon awakening, and reports has continued throughout the week. Patient reports headache would improve but then would worsen again. Patient states she felt like certain foods she was eating such as processed foods was causing her headache to worsen again. Patient states waking up with migraines often happens for her. Patient reports the above described pattern of her headache ever this week is fairly normal for her, except states that usually the headache would have resolved over the last day or 2.  Patient states headache at this time is all over her head, with accompanying aura. Patient describes her aura as visual disturbances as changes in colors which is consistent with her normal migraines. Patient reports that she did have this yesterday but has since resolved. Denies any current vision changes, blurred vision or vision complaints. Also reports photosensitivity and sound sensitivity.  Denies abrupt onset of headache. Denies fevers.   Patient reports that she has follow with neurologist for similar. Patient reports that she had been following with [facility] and had started with Botox injections but recently changed to [facility] neurology due to insurance. Patient reports her headache is been unresolved with over-the-counter migraine treatments as well as a medication given by neurologist,  but states unsure of name. States her medications helped, but did not fully resolve her headache.   Patient reports yesterday when at work she began having nausea accompanying her headache and has had some intermittent nausea since. Denies vomiting. Denies fevers, chest pain, shortness of breath, abdominal pain, dysuria, extremity pain, extremity swelling, numbness or tingling sensation. Denies weakness.  Patient reports that she is here at this time for her chronic migraine, and requests Toradol injection. Patient states she has had to go to ER and urgent care multiple times in the past for migraines and usually starts with Toradol injections and if that does not help she then has to have a migraine cocktail in the ER. Patient reports this is the plan is discussed with her neurologist in the past.  Verdie Shire, MD PCP No LMP recorded. Patient has had a hysterectomy.   Past Medical History:  Diagnosis Date  . Anxiety   . Arthritis   . Bipolar 2 disorder ([place])   . Depression   . Fibromyalgia   . Migraine aura, persistent     There are no active problems to display for this patient.   Past Surgical History:  Procedure Laterality Date  . ABDOMINAL HYSTERECTOMY    . CESAREAN SECTION    . ESOPHAGOGASTRODUODENOSCOPY    . FOOT SURGERY Bilateral   . GASTRIC BYPASS    . HERNIA REPAIR    . HIP SURGERY    . KNEE SURGERY Bilateral   . LAPAROSCOPIC OOPHERECTOMY    . SHOULDER SURGERY Bilateral   . TONSILLECTOMY    . TUBAL LIGATION      Current Outpatient Rx  . Order #: [id number] Class: Historical Med  . Order #: [id number] Class: Historical Med  . Order #:  [id number] Class: Historical Med  . Order #: [id number] Class: Historical Med  . Order #: [id number] Class: Historical Med  . Order #: [id number] Class: Historical Med  . Order #: [id number] Class: Historical Med  . Order #: [id number] Class: Normal  . Order #: [id number] Class: Historical Med  . Order #: [id number] Class: Historical Med  . Order  #: [id number] Class: Historical Med  . Order #: [id number] Class: Historical Med  . Order #: [id number] Class: Normal  . Order #: [id number] Class: Historical Med  . Order #: [id number] Class: Historical Med  . Order #: [id number] Class: Historical Med  . Order #: [id number] Class: Historical Med  . Order #: [id number] Class: Historical Med  . Order #: [id number] Class: Historical Med  . Order #: [id number] Class: Historical Med  . Order #: [id number] Class: Normal  . Order #: [id number] Class: Print  . Order #: [id number] Class: Historical Med  . Order #: [id number] Class: Historical Med  . Order #: [id number] Class: Historical Med  . Order #: [id number] Class: Historical Med    No current facility-administered medications for this encounter.   Current Outpatient Prescriptions:  .  acetaminophen (TYLENOL) 650 MG CR tablet, Take 650 mg by mouth every 8 (eight) hours as needed for pain., Disp: , Rfl:  .  albuterol (PROVENTIL HFA;VENTOLIN HFA) 108 (90 BASE) MCG/ACT inhaler, Inhale 2 puffs into the lungs every 6 (six) hours as needed for wheezing or shortness of breath., Disp: , Rfl:  .  ALPRAZolam (XANAX) 0.5 MG tablet, Take 0.5 mg by mouth 2 (two) times daily as needed for anxiety., Disp: , Rfl:  .  candesartan (ATACAND) 4 MG tablet, Take 4 mg by mouth daily., Disp: , Rfl:  .  cetirizine (ZYRTEC) 10 MG tablet, Take 10 mg by mouth daily., Disp: , Rfl:  .  clonazePAM (KLONOPIN) 0.5 MG tablet, Take 1 mg by mouth at bedtime., Disp: , Rfl:  .  DULoxetine (CYMBALTA) 60 MG capsule, Take 120 mg by mouth daily., Disp: , Rfl:  .  fluticasone (FLONASE) 50 MCG/ACT nasal spray, Place 1 spray into both nostrils 2 (two) times daily., Disp: 16 g, Rfl: 0 .  fluticasone-salmeterol (ADVAIR HFA) 115-21 MCG/ACT inhaler, Inhale 2 puffs into the lungs 2 (two) times daily., Disp: , Rfl:  .  gabapentin (NEURONTIN) 300 MG capsule, Take 300 mg by mouth 3 (three) times daily., Disp: , Rfl:  .  Melatonin 3 MG CAPS, Take 1 capsule by mouth at  bedtime., Disp: , Rfl:  .  Multiple Vitamin (MULTIVITAMIN) tablet, Take 1 tablet by mouth daily., Disp: , Rfl:  .  mupirocin ointment (BACTROBAN) 2 %, Apply 1 application topically 2 (two) times daily., Disp: 22 g, Rfl: 0 .  ondansetron (ZOFRAN-ODT) 4 MG disintegrating tablet, Take 4 mg by mouth every 8 (eight) hours as needed for nausea or vomiting., Disp: , Rfl:  .  Oxcarbazepine (TRILEPTAL) 300 MG tablet, Take 300 mg by mouth 2 (two) times daily with breakfast and lunch., Disp: , Rfl:  .  oxcarbazepine (TRILEPTAL) 600 MG tablet, Take 600 mg by mouth at bedtime., Disp: , Rfl:  .  progesterone (PROMETRIUM) 100 MG capsule, Take 100 mg by mouth daily., Disp: , Rfl:  .  traMADol (ULTRAM) 50 MG tablet, Take 50 mg by mouth every 6 (six) hours as needed., Disp: , Rfl:  .  vitamin B-12 (CYANOCOBALAMIN) 1000 MCG tablet, Take 1,000 mcg by mouth daily., Disp: , Rfl:  .  aspirin 325 MG tablet, Take 325 mg by mouth daily., Disp: , Rfl:  .  cephALEXin (KEFLEX) 500 MG capsule, Take 1 capsule (500  mg total) by mouth 2 (two) times daily., Disp: 20 capsule, Rfl: 0 .  HYDROcodone-acetaminophen (NORCO/VICODIN) 5-325 MG tablet, Take 1 tablet by mouth every 8 (eight) hours as needed for moderate pain or severe pain (do not drive or operate machinery while taking as can cause drowsiness. Do not take with additional pain medication.)., Disp: 2 tablet, Rfl: 0 .  loratadine (CLARITIN) 10 MG tablet, Take 10 mg by mouth daily., Disp: , Rfl:  .  meloxicam (MOBIC) 15 MG tablet, Take 15 mg by mouth at bedtime., Disp: , Rfl:  .  mometasone-formoterol (DULERA) 100-5 MCG/ACT AERO, Inhale 2 puffs into the lungs 2 (two) times daily., Disp: , Rfl:  .  ranitidine (ZANTAC) 150 MG tablet, Take 150 mg by mouth 2 (two) times daily., Disp: , Rfl:   Allergies Bactrim [sulfamethoxazole-trimethoprim]; Ambien [zolpidem]; Depakene [valproic acid]; Flagyl [metronidazole]; Lyrica [pregabalin]; Percocet [oxycodone-acetaminophen]; Compazine  [prochlorperazine]; and Lamictal [lamotrigine]  History reviewed. No pertinent family history.  Social History Social History  Substance Use Topics  . Smoking status: Never Smoker  . Smokeless tobacco: Never Used  . Alcohol use Yes    Review of Systems Constitutional: No fever/chills Eyes: No visual changes. ENT: No sore throat. Cardiovascular: Denies chest pain. Respiratory: Denies shortness of breath. Gastrointestinal: No abdominal pain.  No diarrhea.  No constipation. Genitourinary: Negative for dysuria. Musculoskeletal: Negative for back pain. Skin: Negative for rash. Neurological: Negative for  focal weakness or numbness.  10-point ROS otherwise negative.  ____________________________________________   PHYSICAL EXAM:  VITAL SIGNS: ED Triage Vitals  Enc Vitals Group     BP 06/29/16 1949 105/60     Pulse Rate 06/29/16 1949 66     Resp 06/29/16 1949 16     Temp 06/29/16 1949 98.6 F (37 C)     Temp Source 06/29/16 1949 Oral     SpO2 06/29/16 1949 100 %     Weight 06/29/16 1951 222 lb (100.7 kg)     Height 06/29/16 1951 5\' 4"  (1.626 m)     Head Circumference --      Peak Flow --      Pain Score 06/29/16 2003 8     Pain Loc --      Pain Edu? --      Excl. in [place]? --    Constitutional: Alert and oriented. Well appearing and in no acute distress. Eyes: Conjunctivae are normal. PERRL. EOMI. No pain with EOMs.  Head: Atraumatic. No tenderness over temporal arteries. No sinus TTP. No tenderness to palpation.   Ears: no erythema, normal TMs bilaterally.   Nose: No congestion/rhinnorhea.  Mouth/Throat: Mucous membranes are moist.  Oropharynx non-erythematous. Neck: No stridor.  No cervical spine tenderness to palpation. No carotid bruits.  Hematological/Lymphatic/Immunilogical: No cervical lymphadenopathy. Cardiovascular: Normal rate, regular rhythm. Grossly normal heart sounds.  Good peripheral circulation. Respiratory: Normal respiratory effort.  No retractions.  Lungs CTAB.No wheezes, rales or rhonchi. Gastrointestinal: Soft and nontender. No distention. Normal Bowel sounds.No CVA tenderness. Musculoskeletal: No lower or upper extremity tenderness nor edema.   Bilateral pedal pulses equal and easily palpated. No cervical, thoracic or lumbar tenderness to palpation.  Neurologic:  Normal speech and language. No gross focal neurologic deficits are appreciated. No gait instability. No ataxia. Negative Romberg. No meningismus.  Equal sensation to face, bilateral upper and lower extremities. Steady gait. Changes positions quickly. Skin:  Skin is warm, dry and intact. No rash noted. Psychiatric: Mood and affect are normal. Speech and behavior are normal.   ___________________________________________  LABS (all labs ordered are listed, but only abnormal results are displayed)  Labs Reviewed - No data to display ____________________________________________  RADIOLOGY  No results found.   CT results obtained via Epic  INDICATION: G43.111 Migraine with aura, intractable, with status migrainosus, HEADACHE provided.  COMPARISON: 07/07/2011  TECHNIQUE: Standard noncontrast brain CT.  FINDINGS:  No hemorrhage, extra-axial fluid collection, cerebral edema, acute cortical infarction, mass, mass effect, midline shift. Ventricles and sulci are normal for patient age.  Normal paranasal sinuses, orbits and cranium.  IMPRESSION: No acute intracranial abnormalities.       Electronically Reviewed [id number] Quinn Plowman, MD Electronically Reviewed on:03/23/2015 2:57 PM  I have reviewed the images and concur with the above findings.  Electronically Signed [id number] Gennaro Africa, MD Electronically Signed on:03/23/2015 3:48 PM ____________________________________________   PROCEDURES Procedures    INITIAL IMPRESSION / ASSESSMENT AND PLAN / ED COURSE  Pertinent labs & imaging results that were available during my care of the  patient were reviewed by me and considered in my medical decision making (see chart for details).  Overall well-appearing patient. No acute distress. Presents for the complaint of migraine. Patient with chronic history of migraines similarly. Patient denies abrupt onset. Reports current migraine headache is similar to previous. Reports successful treatment with IM Toradol in the past. Will administer 60 mg IM Toradol and 8 mg ODT Zofran.  Discussed in detail with patient for no improvement or resolution or any worsening concerns for patient to proceed directly to the emergency room for further evaluation and treatment. Encouraged rest, fluids and supportive care as well as avoidance of triggers. Encourage patient follow-up closely with primary care physician and her neurologist.  Less than 20 minutes after receiving IM Toradol, patient reports pain is now 7 out of 10 and is currently requesting to be discharged. Patient reports a nurse that patient is feeling much better. Will discharge patient. As discussed, encouraged patient to avoid triggers, encouraged supportive care, encourage close PCP and neurology follow-up as well as strict follow-up and return parameters including up to going to emergency room.  Discussed follow up with Primary care physician this week. Discussed follow up and return parameters including no resolution or any worsening concerns. Patient verbalized understanding and agreed to plan.   ____________________________________________   FINAL CLINICAL IMPRESSION(S) / ED DIAGNOSES  Final diagnoses:  Migraine without status migrainosus, not intractable, unspecified migraine type     Discharge Medication List as of 06/29/2016  8:51 PM      Note: This dictation was prepared with Dragon dictation along with smaller phrase technology. Any transcriptional errors that result from this process are unintentional.    Clinical Course      Marylene Land, NP 06/29/16 2056

## 2016-06-29 NOTE — ED Notes (Signed)
Patient shows no signs of adverse reaction to medication at this time.  

## 2016-06-29 NOTE — ED Triage Notes (Signed)
Pt has had a migraine since last Saturday. Has intermittent N/V, and weakness. Pt has intermittent aura of visual changes in colors. Has a hx of migraines for over 30 years.

## 2016-07-01 ENCOUNTER — Telehealth: Payer: Self-pay

## 2016-07-01 NOTE — Telephone Encounter (Signed)
Courtesy call back completed today after patients visit at [facility]. Patient improved and will follow up with their PCP if symptoms continue or worsen.   

## 2016-09-24 ENCOUNTER — Ambulatory Visit
Admission: EM | Admit: 2016-09-24 | Discharge: 2016-09-24 | Disposition: A | Discharge status: Home / Self Care | Payer: BLUE CROSS/BLUE SHIELD | Attending: Emergency Medicine | Admitting: Emergency Medicine

## 2016-09-24 ENCOUNTER — Ambulatory Visit (INDEPENDENT_AMBULATORY_CARE_PROVIDER_SITE_OTHER): Payer: BLUE CROSS/BLUE SHIELD

## 2016-09-24 DIAGNOSIS — M25561 Pain in right knee: Secondary | ICD-10-CM | POA: Diagnosis not present

## 2016-09-24 DIAGNOSIS — M25462 Effusion, left knee: Secondary | ICD-10-CM | POA: Diagnosis not present

## 2016-09-24 DIAGNOSIS — M25562 Pain in left knee: Secondary | ICD-10-CM | POA: Diagnosis not present

## 2016-09-24 DIAGNOSIS — M25461 Effusion, right knee: Secondary | ICD-10-CM

## 2016-09-24 MED ORDER — HYDROCODONE-ACETAMINOPHEN 5-325 MG PO TABS
2.0000 | ORAL_TABLET | ORAL | 0 refills | Status: DC | PRN
Start: 2016-09-24 — End: 2019-06-23

## 2016-09-24 NOTE — ED Provider Notes (Signed)
HPI  SUBJECTIVE:  Kimberly Herring is a 51 y.o. female who presents with shooting anterior left knee pain starting after she hit her knee on a metal drawer on December 15. She states the pain is intermittent, lasting hours. She states the symptoms are worse in the evening after weight bearing and with bending her knee, no alleviating factors. She has tried tramadol, topical Voltaren and Tylenol. She reports swelling, but no erythema, fevers, numbness, tingling, new weakness. Past medical history includes knee osteoarthritis, fibromyalgia, undifferentiated connective tissue disease. She is status post steroid injections of both knees in December. Patient has a CT scan of her left knee scheduled in 2 days. PMD: Dr. Jim Like at [facility] family practice in [employer] [place] [employer], orthopedics: [place] [facility] [address] Dr. Wynelle Beckmann Patient states she is unable to take NSAIDs that she is "prone to ulcers".  Past Medical History:  Diagnosis Date  . Anxiety   . Arthritis   . Bipolar 2 disorder ([place])   . Depression   . Fibromyalgia   . Migraine aura, persistent     Past Surgical History:  Procedure Laterality Date  . ABDOMINAL HYSTERECTOMY    . CESAREAN SECTION    . ESOPHAGOGASTRODUODENOSCOPY    . FOOT SURGERY Bilateral   . GASTRIC BYPASS    . HERNIA REPAIR    . HIP SURGERY    . KNEE SURGERY Bilateral   . LAPAROSCOPIC OOPHERECTOMY    . SHOULDER SURGERY Bilateral   . TONSILLECTOMY    . TUBAL LIGATION      History reviewed. No pertinent family history.  Social History  Substance Use Topics  . Smoking status: Never Smoker  . Smokeless tobacco: Never Used  . Alcohol use Yes    No current facility-administered medications for this encounter.   Current Outpatient Prescriptions:  .  acetaminophen (TYLENOL) 650 MG CR tablet, Take 650 mg by mouth every 8 (eight) hours as needed for pain., Disp: , Rfl:  .  albuterol (PROVENTIL HFA;VENTOLIN HFA) 108 (90 BASE) MCG/ACT inhaler,  Inhale 2 puffs into the lungs every 6 (six) hours as needed for wheezing or shortness of breath., Disp: , Rfl:  .  ALPRAZolam (XANAX) 0.5 MG tablet, Take 0.5 mg by mouth 2 (two) times daily as needed for anxiety., Disp: , Rfl:  .  aspirin 325 MG tablet, Take 325 mg by mouth daily., Disp: , Rfl:  .  candesartan (ATACAND) 4 MG tablet, Take 4 mg by mouth daily., Disp: , Rfl:  .  cetirizine (ZYRTEC) 10 MG tablet, Take 10 mg by mouth daily., Disp: , Rfl:  .  clonazePAM (KLONOPIN) 0.5 MG tablet, Take 1 mg by mouth at bedtime., Disp: , Rfl:  .  DULoxetine (CYMBALTA) 60 MG capsule, Take 120 mg by mouth daily., Disp: , Rfl:  .  fluticasone (FLONASE) 50 MCG/ACT nasal spray, Place 1 spray into both nostrils 2 (two) times daily., Disp: 16 g, Rfl: 0 .  fluticasone-salmeterol (ADVAIR HFA) 115-21 MCG/ACT inhaler, Inhale 2 puffs into the lungs 2 (two) times daily., Disp: , Rfl:  .  gabapentin (NEURONTIN) 300 MG capsule, Take 300 mg by mouth 3 (three) times daily., Disp: , Rfl:  .  HYDROcodone-acetaminophen (NORCO/VICODIN) 5-325 MG tablet, Take 2 tablets by mouth every 4 (four) hours as needed for moderate pain., Disp: 20 tablet, Rfl: 0 .  loratadine (CLARITIN) 10 MG tablet, Take 10 mg by mouth daily., Disp: , Rfl:  .  Melatonin 3 MG CAPS, Take 1  capsule by mouth at bedtime., Disp: , Rfl:  .  meloxicam (MOBIC) 15 MG tablet, Take 15 mg by mouth at bedtime., Disp: , Rfl:  .  mometasone-formoterol (DULERA) 100-5 MCG/ACT AERO, Inhale 2 puffs into the lungs 2 (two) times daily., Disp: , Rfl:  .  Multiple Vitamin (MULTIVITAMIN) tablet, Take 1 tablet by mouth daily., Disp: , Rfl:  .  mupirocin ointment (BACTROBAN) 2 %, Apply 1 application topically 2 (two) times daily., Disp: 22 g, Rfl: 0 .  ondansetron (ZOFRAN-ODT) 4 MG disintegrating tablet, Take 4 mg by mouth every 8 (eight) hours as needed for nausea or vomiting., Disp: , Rfl:  .  Oxcarbazepine (TRILEPTAL) 300 MG tablet, Take 300 mg by mouth 2 (two) times daily with  breakfast and lunch., Disp: , Rfl:  .  oxcarbazepine (TRILEPTAL) 600 MG tablet, Take 600 mg by mouth at bedtime., Disp: , Rfl:  .  progesterone (PROMETRIUM) 100 MG capsule, Take 100 mg by mouth daily., Disp: , Rfl:  .  ranitidine (ZANTAC) 150 MG tablet, Take 150 mg by mouth 2 (two) times daily., Disp: , Rfl:  .  vitamin B-12 (CYANOCOBALAMIN) 1000 MCG tablet, Take 1,000 mcg by mouth daily., Disp: , Rfl:   Allergies  Allergen Reactions  . Bactrim [Sulfamethoxazole-Trimethoprim] Anaphylaxis  . Ambien [Zolpidem] Other (See Comments)    Sleep Walking  . Depakene [Valproic Acid] Other (See Comments)    Foot Ulcers  . Flagyl [Metronidazole] Nausea And Vomiting  . Lyrica [Pregabalin] Swelling  . Percocet [Oxycodone-Acetaminophen] Hives  . Compazine [Prochlorperazine] Anxiety  . Lamictal [Lamotrigine] Rash     ROS  As noted in HPI.   Physical Exam  BP 130/75 (BP Location: Right Arm)   Pulse 75   Temp 98 F (36.7 C) (Oral)   Resp 18   Ht 5\' 3"  (1.6 m)   Wt 226 lb (102.5 kg)   SpO2 100%   BMI 40.03 kg/m   Constitutional: Well developed, well nourished, no acute distressObese.  Eyes:  EOMI, conjunctiva normal bilaterally HENT: Normocephalic, atraumatic,mucus membranes moist Respiratory: Normal inspiratory effort Cardiovascular: Normal rate GI: nondistended skin: No rash, skin intact Musculoskeletal:Bilateral knees symmetric. No appreciable significant soft tissue swelling left knee. No bruising, erythema, increased temperature. 1+ edema both knees.Pain with flexion. No appreciable crepitus. Joint stable on varus/valgus stress. Knee ROM baseline for PT, Flexion  intact ,  Patella NT, Patellar tendon tender, Medial joint  tender, Lateral joint NT, Popliteal region NT, Lachman's stable, Varus LCL stress testing stable, Valgus MCL stress testing stable, McMurray's testing normal, distal NVI with intact baseline sensation / motor / pulse distal to knee affected extremity. + effusion.   Neurologic : Alert & oriented x 3, no focal neuro deficits Psychiatric: Speech and behavior appropriate   ED Course   Medications - No data to display  Orders Placed This Encounter  Procedures  . DG Knee Complete 4 Views Left    Standing Status:   Standing    Number of Occurrences:   1    Order Specific Question:   Reason for Exam (SYMPTOM  OR DIAGNOSIS REQUIRED)    Answer:   anterior knee trauma tender medial knee and at patellar tendon r/o acute changes    No results found for this or any previous visit (from the past 24 hour(s)). Dg Knee Complete 4 Views Left  Result Date: 09/24/2016 CLINICAL DATA:  Left knee injury on September 07, 2016 with continue knee pain. EXAM: LEFT KNEE - COMPLETE 4+ VIEW COMPARISON:  None. FINDINGS: No evidence of fracture, dislocation. There is a suprapatellar effusion. There are degenerative joint changes with left knee with narrowed joint space and osteophyte formation. IMPRESSION: No acute fracture or dislocation. Osteoarthritic changes of left knee. Electronically Signed   By: Abelardo Diesel M.D.   On: 09/24/2016 21:04    ED Clinical Impression  Right knee pain, unspecified chronicity  Effusion, right knee   ED Assessment/Plan  [facility] narcotic database reviewed. Patient has had 2 prescriptions of tramadol this month, one on 12/11, another on 12/29, both for #112 tablets. This is a 14 day supply.  Care everywhere  reviewed. As noted in HPI.  Right Total knee arthroplasty was recommended on office visit of 12/13.  Reviewed imaging independently. No acute fracture, dislocation. Positive suprapatellar effusion. Osteoarthritic changes.See radiology report for details   No evidence of septic arthritis, septic bursitis. No evidence of fracture on x-ray.  Patient to continue Voltaren gel, start Tylenol 1 g up to 4 times a day. Will give patient a short prescription of Norco since the tramadol doesn't seem to be helping. She will need to  follow-up with her orthopedic surgeon for reevaluation and further management. Patient to not take Norco and the Tylenol, not to exceed 4 g of Tylenol from all sources in one day.  Discussed labs, imaging, MDM, plan and followup with patient / parent / family. Discussed sn/sx that should prompt return to the ED. Patient / parent / family agrees with plan.   Meds ordered this encounter  Medications  . HYDROcodone-acetaminophen (NORCO/VICODIN) 5-325 MG tablet    Sig: Take 2 tablets by mouth every 4 (four) hours as needed for moderate pain.    Dispense:  20 tablet    Refill:  0    *This clinic note was created using [employer]. Therefore, there may be occasional mistakes despite careful proofreading.  ?   Melynda Ripple, MD 09/24/16 2121

## 2016-09-24 NOTE — Discharge Instructions (Signed)
Take the Tylenol and the Norco as we discussed. Stop tramadol. Continue Voltaren gel. Ice to knee for 10 minutes at a time.

## 2016-09-24 NOTE — ED Triage Notes (Signed)
Pt has bad knees and she had injections on Dec 13 in both knees, and she banged it on the side of the table Dec 15, She says there is some swelling and soft tissue pain. Last x rays were Dec 13

## 2019-03-10 ENCOUNTER — Encounter: Payer: Self-pay | Admitting: Emergency Medicine

## 2019-03-10 ENCOUNTER — Emergency Department: Payer: BC Managed Care – PPO

## 2019-03-10 ENCOUNTER — Emergency Department
Admission: EM | Admit: 2019-03-10 | Discharge: 2019-03-10 | Disposition: A | Discharge status: Home / Self Care | Payer: BC Managed Care – PPO | Attending: Emergency Medicine | Admitting: Emergency Medicine

## 2019-03-10 DIAGNOSIS — Z7982 Long term (current) use of aspirin: Secondary | ICD-10-CM | POA: Diagnosis not present

## 2019-03-10 DIAGNOSIS — R531 Weakness: Secondary | ICD-10-CM | POA: Insufficient documentation

## 2019-03-10 DIAGNOSIS — Z79899 Other long term (current) drug therapy: Secondary | ICD-10-CM | POA: Diagnosis not present

## 2019-03-10 LAB — CBC
HCT: 39.1 % (ref 36.0–46.0)
Hemoglobin: 12.2 g/dL (ref 12.0–15.0)
MCH: 27.8 pg (ref 26.0–34.0)
MCHC: 31.2 g/dL (ref 30.0–36.0)
MCV: 89.1 fL (ref 80.0–100.0)
Platelets: 224 10*3/uL (ref 150–400)
RBC: 4.39 MIL/uL (ref 3.87–5.11)
RDW: 14.6 % (ref 11.5–15.5)
WBC: 14.1 10*3/uL — ABNORMAL HIGH (ref 4.0–10.5)
nRBC: 0 % (ref 0.0–0.2)

## 2019-03-10 LAB — BASIC METABOLIC PANEL
Anion gap: 12 (ref 5–15)
BUN: 10 mg/dL (ref 6–20)
CO2: 25 mmol/L (ref 22–32)
Calcium: 9.2 mg/dL (ref 8.9–10.3)
Chloride: 103 mmol/L (ref 98–111)
Creatinine, Ser: 0.98 mg/dL (ref 0.44–1.00)
GFR calc Af Amer: >60 mL/min (ref >60)
GFR calc non Af Amer: >60 mL/min (ref >60)
Glucose, Bld: 101 mg/dL — ABNORMAL HIGH (ref 70–99)
Potassium: 3.9 mmol/L (ref 3.5–5.1)
Sodium: 140 mmol/L (ref 135–145)

## 2019-03-10 LAB — CBC WITH DIFFERENTIAL/PLATELET
Abs Immature Granulocytes: 0.07 10*3/uL (ref 0.00–0.07)
Basophils Absolute: 0.1 10*3/uL (ref 0.0–0.1)
Basophils Relative: 0 %
Eosinophils Absolute: 0.1 10*3/uL (ref 0.0–0.5)
Eosinophils Relative: 0 %
HCT: 39 % (ref 36.0–46.0)
Hemoglobin: 12.1 g/dL (ref 12.0–15.0)
Immature Granulocytes: 1 %
Lymphocytes Relative: 12 %
Lymphs Abs: 1.7 10*3/uL (ref 0.7–4.0)
MCH: 27.8 pg (ref 26.0–34.0)
MCHC: 31 g/dL (ref 30.0–36.0)
MCV: 89.4 fL (ref 80.0–100.0)
Monocytes Absolute: 0.8 10*3/uL (ref 0.1–1.0)
Monocytes Relative: 6 %
Neutro Abs: 11.3 10*3/uL — ABNORMAL HIGH (ref 1.7–7.7)
Neutrophils Relative %: 81 %
Platelets: 234 10*3/uL (ref 150–400)
RBC: 4.36 MIL/uL (ref 3.87–5.11)
RDW: 14.7 % (ref 11.5–15.5)
WBC: 13.9 10*3/uL — ABNORMAL HIGH (ref 4.0–10.5)
nRBC: 0 % (ref 0.0–0.2)

## 2019-03-10 LAB — URINALYSIS, COMPLETE (UACMP) WITH MICROSCOPIC
Bacteria, UA: NONE SEEN
Bilirubin Urine: NEGATIVE
Glucose, UA: NEGATIVE mg/dL
Hgb urine dipstick: NEGATIVE
Ketones, ur: NEGATIVE mg/dL
Leukocytes,Ua: NEGATIVE
Nitrite: NEGATIVE
Protein, ur: NEGATIVE mg/dL
Specific Gravity, Urine: 1.005 (ref 1.005–1.030)
pH: 8 (ref 5.0–8.0)

## 2019-03-10 LAB — HEPATIC FUNCTION PANEL
ALT: 16 U/L (ref 0–44)
AST: 21 U/L (ref 15–41)
Albumin: 4.1 g/dL (ref 3.5–5.0)
Alkaline Phosphatase: 94 U/L (ref 38–126)
Bilirubin, Direct: <0.1 mg/dL (ref 0.0–0.2)
Total Bilirubin: 0.4 mg/dL (ref 0.3–1.2)
Total Protein: 7.4 g/dL (ref 6.5–8.1)

## 2019-03-10 LAB — TSH: TSH: 1.691 u[IU]/mL (ref 0.350–4.500)

## 2019-03-10 LAB — GLUCOSE, CAPILLARY: Glucose-Capillary: 98 mg/dL (ref 70–99)

## 2019-03-10 LAB — TROPONIN I: Troponin I: <0.03 ng/mL (ref <0.03)

## 2019-03-10 MED ORDER — SODIUM CHLORIDE 0.9% FLUSH
3.0000 mL | Freq: Once | INTRAVENOUS | Status: AC
  Administered 2019-03-10: 3 mL via INTRAVENOUS

## 2019-03-10 NOTE — ED Provider Notes (Addendum)
[facility] Emergency Department Provider Note   ____________________________________________   First MD Initiated Contact with Patient 03/10/19 2124     (approximate)  I have reviewed the triage vital signs and the nursing notes.   HISTORY  Chief Complaint Weakness    HPI Kimberly Herring is a 53 y.o. female patient reports that since last Thursday it seems like her hands and brains are not communicating well.  She said the right side of her body feels off a little weak perhaps her speech does not sound right she says.  She says people at work have noticed this as well.  Does not have any obvious weakness on exam however.         Past Medical History:  Diagnosis Date  . Anxiety   . Arthritis   . Bipolar 2 disorder ([place])   . Depression   . Fibromyalgia   . Migraine aura, persistent     There are no active problems to display for this patient.   Past Surgical History:  Procedure Laterality Date  . ABDOMINAL HYSTERECTOMY    . CESAREAN SECTION    . ESOPHAGOGASTRODUODENOSCOPY    . FOOT SURGERY Bilateral   . GASTRIC BYPASS    . HERNIA REPAIR    . HIP SURGERY    . KNEE SURGERY Bilateral   . LAPAROSCOPIC OOPHERECTOMY    . SHOULDER SURGERY Bilateral   . TONSILLECTOMY    . TUBAL LIGATION      Prior to Admission medications   Medication Sig Start Date End Date Taking? Authorizing Provider  acetaminophen (TYLENOL) 650 MG CR tablet Take 650 mg by mouth every 8 (eight) hours as needed for pain.    [provider]  albuterol (PROVENTIL HFA;VENTOLIN HFA) 108 (90 BASE) MCG/ACT inhaler Inhale 2 puffs into the lungs every 6 (six) hours as needed for wheezing or shortness of breath.    [provider]  ALPRAZolam Duanne Moron) 0.5 MG tablet Take 0.5 mg by mouth 2 (two) times daily as needed for anxiety.    [provider]  aspirin 325 MG tablet Take 325 mg by mouth daily.    [provider]  candesartan (ATACAND) 4 MG tablet  Take 4 mg by mouth daily.    [provider]  cetirizine (ZYRTEC) 10 MG tablet Take 10 mg by mouth daily.    [provider]  clonazePAM (KLONOPIN) 0.5 MG tablet Take 1 mg by mouth at bedtime.    [provider]  DULoxetine (CYMBALTA) 60 MG capsule Take 120 mg by mouth daily.    [provider]  fluticasone (FLONASE) 50 MCG/ACT nasal spray Place 1 spray into both nostrils 2 (two) times daily. 04/04/15   Betancourt, Aura Fey, NP  fluticasone-salmeterol (ADVAIR HFA) 115-21 MCG/ACT inhaler Inhale 2 puffs into the lungs 2 (two) times daily.    [provider]  gabapentin (NEURONTIN) 300 MG capsule Take 300 mg by mouth 3 (three) times daily.    [provider]  HYDROcodone-acetaminophen (NORCO/VICODIN) 5-325 MG tablet Take 2 tablets by mouth every 4 (four) hours as needed for moderate pain. 09/24/16   Melynda Ripple, MD  loratadine (CLARITIN) 10 MG tablet Take 10 mg by mouth daily.    [provider]  Melatonin 3 MG CAPS Take 1 capsule by mouth at bedtime.    [provider]  meloxicam (MOBIC) 15 MG tablet Take 15 mg by mouth at bedtime.    [provider]  mometasone-formoterol (DULERA) 100-5  MCG/ACT AERO Inhale 2 puffs into the lungs 2 (two) times daily.    [provider]  Multiple Vitamin (MULTIVITAMIN) tablet Take 1 tablet by mouth daily.    [provider]  mupirocin ointment (BACTROBAN) 2 % Apply 1 application topically 2 (two) times daily. 04/04/15   Betancourt, Aura Fey, NP  ondansetron (ZOFRAN-ODT) 4 MG disintegrating tablet Take 4 mg by mouth every 8 (eight) hours as needed for nausea or vomiting.    [provider]  Oxcarbazepine (TRILEPTAL) 300 MG tablet Take 300 mg by mouth 2 (two) times daily with breakfast and lunch.    [provider]  oxcarbazepine (TRILEPTAL) 600 MG tablet Take 600 mg by mouth at bedtime.    [provider]  progesterone (PROMETRIUM) 100 MG capsule  Take 100 mg by mouth daily.    [provider]  ranitidine (ZANTAC) 150 MG tablet Take 150 mg by mouth 2 (two) times daily.    [provider]  vitamin B-12 (CYANOCOBALAMIN) 1000 MCG tablet Take 1,000 mcg by mouth daily.    [provider]    Allergies Bactrim [sulfamethoxazole-trimethoprim], Ambien [zolpidem], Depakene [valproic acid], Flagyl [metronidazole], Lyrica [pregabalin], Percocet [oxycodone-acetaminophen], Compazine [prochlorperazine], and Lamictal [lamotrigine]  No family history on file.  Social History Social History   Tobacco Use  . Smoking status: Never Smoker  . Smokeless tobacco: Never Used  Substance Use Topics  . Alcohol use: Yes  . Drug use: No    Review of Systems  Constitutional: No fever/chills Eyes: No visual changes. ENT: No sore throat. Cardiovascular: Denies chest pain. Respiratory: Denies shortness of breath. Gastrointestinal: No abdominal pain.  No nausea, no vomiting.  No diarrhea.  No constipation. Genitourinary: Negative for dysuria. Musculoskeletal: Negative for back pain. Skin: Negative for rash. Neurological: See HPI ____________________________________________   PHYSICAL EXAM:  VITAL SIGNS: ED Triage Vitals  Enc Vitals Group     BP 03/10/19 1749 132/74     Pulse Rate 03/10/19 1749 93     Resp 03/10/19 1749 16     Temp 03/10/19 1749 98.1 F (36.7 C)     Temp Source 03/10/19 1749 Oral     SpO2 03/10/19 1749 99 %     Weight 03/10/19 1754 252 lb (114.3 kg)     Height 03/10/19 1754 5\' 4"  (1.626 m)     Head Circumference --      Peak Flow --      Pain Score 03/10/19 1753 10     Pain Loc --      Pain Edu? --      Excl. in [place]? --     Constitutional: Alert and oriented. Well appearing and in no acute distress. Eyes: Conjunctivae are normal. PERRL. EOMI. Head: Atraumatic. Nose: No congestion/rhinnorhea. Mouth/Throat: Mucous membranes are moist.  Oropharynx non-erythematous. Neck: No stridor.  Cardiovascular: Normal rate, regular rhythm. Grossly normal heart sounds.  Good peripheral circulation. Respiratory: Normal respiratory effort.  No retractions. Lungs CTAB. Gastrointestinal: Soft and nontender. No distention. No abdominal bruits. No CVA tenderness. Musculoskeletal: No lower extremity tenderness nor edema.   Neurologic:  Normal speech and language. No gross focal neurologic deficits are appreciated.  Cranial nerves II through XII are intact cerebellar finger-to-nose is normal although there is a slight tremor there.  Rapid alternating movements are normal motor strength appears to be 5/5 throughout.  Patient reports some slight right-sided not feeling the same as left side. Skin:  Skin is warm, dry and intact. No rash noted.  ____________________________________________   LABS (all labs ordered are listed, but only abnormal results are displayed)  Labs Reviewed  BASIC METABOLIC PANEL - Abnormal; Notable for the following components:      Result Value   Glucose, Bld 101 (*)    All other components within normal limits  CBC - Abnormal; Notable for the following components:   WBC 14.1 (*)    All other components within normal limits  CBC WITH DIFFERENTIAL/PLATELET - Abnormal; Notable for the following components:   WBC 13.9 (*)    Neutro Abs 11.3 (*)    All other components within normal limits  GLUCOSE, CAPILLARY  HEPATIC FUNCTION PANEL  TSH  URINALYSIS, COMPLETE (UACMP) WITH MICROSCOPIC  TROPONIN I  DIFFERENTIAL  CBG MONITORING, ED   ____________________________________________  EKG EKG read and interpreted by me shows normal sinus rhythm rate of 91 normal axis decreased amplitude of most of the leads.  ____________________________________________  RADIOLOGY  ED MD interpretation: Chest x-ray and head CT read by radiology reviewed by me do not show any acute findings  Official radiology report(s): Dg Chest 2 View  Result Date: 03/10/2019 CLINICAL DATA:   Weakness EXAM: CHEST - 2 VIEW COMPARISON:  None FINDINGS: Heart and mediastinal contours are within normal limits. No focal opacities or effusions. No acute bony abnormality. IMPRESSION: No active cardiopulmonary disease. Electronically Signed   By: Rolm Baptise M.D.   On: 03/10/2019 21:53   Ct Head Wo Contrast  Result Date: 03/10/2019 CLINICAL DATA:  Right-sided weakness.  Dizziness.  Headache. EXAM: CT HEAD WITHOUT CONTRAST TECHNIQUE: Contiguous axial images were obtained from the base of the skull through the vertex without intravenous contrast. COMPARISON:  None. FINDINGS: Brain: The brainstem, cerebellum, cerebral peduncles, thalami, basal ganglia, basilar cisterns, and ventricular system appear within normal limits. No intracranial hemorrhage, mass lesion, or acute CVA. Vascular: Unremarkable Skull: Unremarkable Sinuses/Orbits: Unremarkable Other: No supplemental non-categorized findings. IMPRESSION: 1.  No significant abnormality identified. Electronically Signed   By: Van Clines M.D.   On: 03/10/2019 19:20    ____________________________________________   PROCEDURES  Procedure(s) performed (including Critical Care):  Procedures   ____________________________________________   INITIAL IMPRESSION / ASSESSMENT AND PLAN / ED COURSE  Plan to get MRI on this patient.  If the MRI is negative we will have her follow-up with primary care and have them refer her to neurology for further evaluation.    Kimberly Herring was evaluated in Emergency Department on 03/10/2019 for the symptoms described in the history of present illness. She was evaluated in the context of the global COVID-19 pandemic, which necessitated consideration that the patient might be at risk for infection with the SARS-CoV-2 virus that causes COVID-19. Institutional protocols and algorithms that pertain to the evaluation of patients at risk for COVID-19 are in a state of rapid change based on information released by  regulatory bodies including the CDC and federal and state organizations. These policies and algorithms were followed during the patient's care in the ED.      Clinical Course as of Mar 15 1244  Tue Mar 10, 2019  2314 Hemoglobin: 12.1 [PM]    Clinical Course User Index [PM] Nena Polio, MD     ____________________________________________   FINAL CLINICAL IMPRESSION(S) / ED DIAGNOSES  Final diagnoses:  Weakness     ED Discharge Orders    None       Note:  This document was prepared using Dragon voice recognition software and may include unintentional dictation errors.  Nena Polio, MD 03/10/19 2256    Nena Polio, MD 03/16/19 1245

## 2019-03-10 NOTE — ED Triage Notes (Signed)
Pt states she felt like her "brain and hands weren't communicating" last Thursday, states her whole right side feels weak today, moving all extremities normally, speech clear, face symmetrical, states she was dizzy around 0730 this am, has a headache now. NAD.

## 2019-03-10 NOTE — Discharge Instructions (Signed)
MRI was normal.  Blood test that have come back also were normal.  Please follow-up with your primary care doctor and also please call the neurologists I gave you.  Dr. Melrose Nakayama and Dr. Manuella Ghazi are very good.  They will be able to evaluate you further and see if there is anything we can do about your problems.  He is return for any worsening.

## 2019-03-10 NOTE — ED Triage Notes (Signed)
Pt states her tongue feels thick and she doesn't think she sounds right. She said this started last Thursday as well.

## 2019-04-10 ENCOUNTER — Other Ambulatory Visit: Payer: Self-pay

## 2019-04-10 ENCOUNTER — Emergency Department: Payer: BC Managed Care – PPO

## 2019-04-10 ENCOUNTER — Encounter: Payer: Self-pay | Admitting: Emergency Medicine

## 2019-04-10 ENCOUNTER — Emergency Department
Admission: EM | Admit: 2019-04-10 | Discharge: 2019-04-10 | Disposition: A | Discharge status: Home / Self Care | Payer: BC Managed Care – PPO | Attending: Emergency Medicine | Admitting: Emergency Medicine

## 2019-04-10 DIAGNOSIS — Z79899 Other long term (current) drug therapy: Secondary | ICD-10-CM | POA: Insufficient documentation

## 2019-04-10 DIAGNOSIS — R4182 Altered mental status, unspecified: Secondary | ICD-10-CM | POA: Diagnosis present

## 2019-04-10 DIAGNOSIS — Z7982 Long term (current) use of aspirin: Secondary | ICD-10-CM | POA: Insufficient documentation

## 2019-04-10 DIAGNOSIS — Z20828 Contact with and (suspected) exposure to other viral communicable diseases: Secondary | ICD-10-CM | POA: Insufficient documentation

## 2019-04-10 DIAGNOSIS — R4781 Slurred speech: Secondary | ICD-10-CM | POA: Diagnosis not present

## 2019-04-10 DIAGNOSIS — R404 Transient alteration of awareness: Secondary | ICD-10-CM | POA: Insufficient documentation

## 2019-04-10 LAB — ETHANOL: Alcohol, Ethyl (B): <10 mg/dL (ref <10)

## 2019-04-10 LAB — CBC WITH DIFFERENTIAL/PLATELET
Abs Immature Granulocytes: 0.08 10*3/uL — ABNORMAL HIGH (ref 0.00–0.07)
Basophils Absolute: 0.1 10*3/uL (ref 0.0–0.1)
Basophils Relative: 1 %
Eosinophils Absolute: 0.1 10*3/uL (ref 0.0–0.5)
Eosinophils Relative: 1 %
HCT: 37 % (ref 36.0–46.0)
Hemoglobin: 11.4 g/dL — ABNORMAL LOW (ref 12.0–15.0)
Immature Granulocytes: 1 %
Lymphocytes Relative: 20 %
Lymphs Abs: 1.8 10*3/uL (ref 0.7–4.0)
MCH: 27.5 pg (ref 26.0–34.0)
MCHC: 30.8 g/dL (ref 30.0–36.0)
MCV: 89.4 fL (ref 80.0–100.0)
Monocytes Absolute: 1 10*3/uL (ref 0.1–1.0)
Monocytes Relative: 11 %
Neutro Abs: 6 10*3/uL (ref 1.7–7.7)
Neutrophils Relative %: 66 %
Platelets: 255 10*3/uL (ref 150–400)
RBC: 4.14 MIL/uL (ref 3.87–5.11)
RDW: 15.2 % (ref 11.5–15.5)
WBC: 9 10*3/uL (ref 4.0–10.5)
nRBC: 0 % (ref 0.0–0.2)

## 2019-04-10 LAB — COMPREHENSIVE METABOLIC PANEL
ALT: 20 U/L (ref 0–44)
AST: 38 U/L (ref 15–41)
Albumin: 4.1 g/dL (ref 3.5–5.0)
Alkaline Phosphatase: 92 U/L (ref 38–126)
Anion gap: 11 (ref 5–15)
BUN: 14 mg/dL (ref 6–20)
CO2: 23 mmol/L (ref 22–32)
Calcium: 9.1 mg/dL (ref 8.9–10.3)
Chloride: 106 mmol/L (ref 98–111)
Creatinine, Ser: 1.51 mg/dL — ABNORMAL HIGH (ref 0.44–1.00)
GFR calc Af Amer: 46 mL/min — ABNORMAL LOW (ref >60)
GFR calc non Af Amer: 40 mL/min — ABNORMAL LOW (ref >60)
Glucose, Bld: 145 mg/dL — ABNORMAL HIGH (ref 70–99)
Potassium: 4.3 mmol/L (ref 3.5–5.1)
Sodium: 140 mmol/L (ref 135–145)
Total Bilirubin: 0.5 mg/dL (ref 0.3–1.2)
Total Protein: 7.4 g/dL (ref 6.5–8.1)

## 2019-04-10 LAB — URINALYSIS, ROUTINE W REFLEX MICROSCOPIC
Bacteria, UA: NONE SEEN
Glucose, UA: NEGATIVE mg/dL
Hgb urine dipstick: NEGATIVE
Ketones, ur: 5 mg/dL — AB
Leukocytes,Ua: NEGATIVE
Nitrite: NEGATIVE
Protein, ur: 100 mg/dL — AB
Specific Gravity, Urine: 1.026 (ref 1.005–1.030)
pH: 5 (ref 5.0–8.0)

## 2019-04-10 LAB — APTT: aPTT: 30 seconds (ref 24–36)

## 2019-04-10 LAB — URINE DRUG SCREEN, QUALITATIVE (ARMC ONLY)
Amphetamines, Ur Screen: POSITIVE — AB
Barbiturates, Ur Screen: NOT DETECTED
Benzodiazepine, Ur Scrn: NOT DETECTED
Cannabinoid 50 Ng, Ur ~~LOC~~: NOT DETECTED
Cocaine Metabolite,Ur ~~LOC~~: NOT DETECTED
MDMA (Ecstasy)Ur Screen: NOT DETECTED
Methadone Scn, Ur: NOT DETECTED
Opiate, Ur Screen: NOT DETECTED
Phencyclidine (PCP) Ur S: NOT DETECTED
Tricyclic, Ur Screen: POSITIVE — AB

## 2019-04-10 LAB — GLUCOSE, CAPILLARY: Glucose-Capillary: 130 mg/dL — ABNORMAL HIGH (ref 70–99)

## 2019-04-10 LAB — PROTIME-INR
INR: 1.1 (ref 0.8–1.2)
Prothrombin Time: 13.6 seconds (ref 11.4–15.2)

## 2019-04-10 LAB — SARS CORONAVIRUS 2 BY RT PCR (HOSPITAL ORDER, PERFORMED IN ~~LOC~~ HOSPITAL LAB): SARS Coronavirus 2: NEGATIVE

## 2019-04-10 NOTE — ED Notes (Addendum)
Pt assisted to room commode to void without success; assisted to stretcher and changed into hosp gown (placed bathrobe, gown, panties, set of keys and cell phone in patient belonging bag); placed on card monitor; pt with slurred speech, very difficult to understand; alert to self but asks where she is at; pt has boot to left LE; st that she broke her ankle "this morning"; pt denies any recent illness, denies any specific c/o, only "doesn't feel well"; resp even/unlab, lungs clear, apical audible & regular, +BS, abd soft/nondist/nontender; MAEW; pt reports that she remembers calling for help this morning after hearing someone "messing with her door knob"

## 2019-04-10 NOTE — ED Notes (Signed)
Pt to CT via stretcher accomp by CT tech 

## 2019-04-10 NOTE — ED Provider Notes (Signed)
[facility] Emergency Department Provider Note  ____________________________________________   First MD Initiated Contact with Patient 04/10/19 [phone]     (approximate)  I have reviewed the triage vital signs and the nursing notes.   HISTORY  Chief Complaint Altered Mental Status  Level 5 caveat:  history/ROS limited by altered mental status/confusion   HPI Kimberly Herring is a 53 y.o. female who presents by EMS for unclear reasons.  Originally the police were called out because the patient believe there was an intruder in her house.   Then paramedics were called because she was confused and was not able to follow commands.  She was not complaining of anything in particular but was clearly not well at the time.  When she came into the emergency department she had slurred speech and would only talk about need to go to the bathroom but had no other clear complaints.  When she was assisted to the restroom she went to sleep.  She was able to deny chest pain and shortness of breath during my assessment but otherwise was not able to provide any additional history.        Past Medical History:  Diagnosis Date  . Anxiety   . Arthritis   . Bipolar 2 disorder ([place])   . Depression   . Fibromyalgia   . Migraine aura, persistent     There are no active problems to display for this patient.   Past Surgical History:  Procedure Laterality Date  . ABDOMINAL HYSTERECTOMY    . CESAREAN SECTION    . ESOPHAGOGASTRODUODENOSCOPY    . FOOT SURGERY Bilateral   . GASTRIC BYPASS    . HERNIA REPAIR    . HIP SURGERY    . KNEE SURGERY Bilateral   . LAPAROSCOPIC OOPHERECTOMY    . SHOULDER SURGERY Bilateral   . TONSILLECTOMY    . TUBAL LIGATION      Prior to Admission medications   Medication Sig Start Date End Date Taking? Authorizing Provider  acetaminophen (TYLENOL) 650 MG CR tablet Take 650 mg by mouth every 8 (eight) hours as needed for pain.    [provider]  albuterol (PROVENTIL HFA;VENTOLIN HFA) 108 (90 BASE) MCG/ACT inhaler Inhale 2 puffs into the lungs every 6 (six) hours as needed for wheezing or shortness of breath.    [provider]  ALPRAZolam Duanne Moron) 0.5 MG tablet Take 0.5 mg by mouth 2 (two) times daily as needed for anxiety.    [provider]  aspirin 325 MG tablet Take 325 mg by mouth daily.    [provider]  candesartan (ATACAND) 4 MG tablet Take 4 mg by mouth daily.    [provider]  cetirizine (ZYRTEC) 10 MG tablet Take 10 mg by mouth daily.    [provider]  clonazePAM (KLONOPIN) 0.5 MG tablet Take 1 mg by mouth at bedtime.    [provider]  DULoxetine (CYMBALTA) 60 MG capsule Take 120 mg by mouth daily.    [provider]  fluticasone (FLONASE) 50 MCG/ACT nasal spray Place 1 spray into both nostrils 2 (two) times daily. 04/04/15   Betancourt, Aura Fey, NP  fluticasone-salmeterol (ADVAIR HFA) 115-21 MCG/ACT inhaler Inhale 2 puffs into the lungs 2 (two) times daily.    [provider]  gabapentin (NEURONTIN) 300 MG capsule Take 300 mg by mouth 3 (three) times daily.    [provider]  HYDROcodone-acetaminophen (NORCO/VICODIN) 5-325 MG tablet Take 2 tablets by mouth  every 4 (four) hours as needed for moderate pain. 09/24/16   Melynda Ripple, MD  loratadine (CLARITIN) 10 MG tablet Take 10 mg by mouth daily.    [provider]  Melatonin 3 MG CAPS Take 1 capsule by mouth at bedtime.    [provider]  meloxicam (MOBIC) 15 MG tablet Take 15 mg by mouth at bedtime.    [provider]  mometasone-formoterol (DULERA) 100-5 MCG/ACT AERO Inhale 2 puffs into the lungs 2 (two) times daily.    [provider]  Multiple Vitamin (MULTIVITAMIN) tablet Take 1 tablet by mouth daily.    [provider]  mupirocin ointment (BACTROBAN) 2 % Apply 1 application topically 2 (two) times daily. 04/04/15   Betancourt, Aura Fey, NP   ondansetron (ZOFRAN-ODT) 4 MG disintegrating tablet Take 4 mg by mouth every 8 (eight) hours as needed for nausea or vomiting.    [provider]  Oxcarbazepine (TRILEPTAL) 300 MG tablet Take 300 mg by mouth 2 (two) times daily with breakfast and lunch.    [provider]  oxcarbazepine (TRILEPTAL) 600 MG tablet Take 600 mg by mouth at bedtime.    [provider]  progesterone (PROMETRIUM) 100 MG capsule Take 100 mg by mouth daily.    [provider]  ranitidine (ZANTAC) 150 MG tablet Take 150 mg by mouth 2 (two) times daily.    [provider]  vitamin B-12 (CYANOCOBALAMIN) 1000 MCG tablet Take 1,000 mcg by mouth daily.    [provider]    Allergies Bactrim [sulfamethoxazole-trimethoprim], Ambien [zolpidem], Depakene [valproic acid], Flagyl [metronidazole], Lyrica [pregabalin], Percocet [oxycodone-acetaminophen], Compazine [prochlorperazine], and Lamictal [lamotrigine]  History reviewed. No pertinent family history.  Social History Social History   Tobacco Use  . Smoking status: Never Smoker  . Smokeless tobacco: Never Used  Substance Use Topics  . Alcohol use: Yes  . Drug use: No    Review of Systems Level 5 caveat:  history/ROS limited by altered mental status/confusion  ____________________________________________   PHYSICAL EXAM:  VITAL SIGNS: ED Triage Vitals [04/10/19 0341]  Enc Vitals Group     BP 120/77     Pulse Rate 90     Resp (!) 31     Temp 98.3 F (36.8 C)     Temp Source Oral     SpO2 95 %     Weight 115 kg (253 lb 8.5 oz)     Height 1.6 m (5\' 3" )     Head Circumference      Peak Flow      Pain Score      Pain Loc      Pain Edu?      Excl. in [place]?     Constitutional: Awake but confused.  No acute distress other than wanting to go to the bathroom. Eyes: Conjunctivae are normal.  Head: Atraumatic. Nose: No congestion/rhinnorhea. Mouth/Throat: Mucous membranes are moist. Neck: No stridor.   No meningeal signs.   Cardiovascular: Normal rate, regular rhythm. Good peripheral circulation. Grossly normal heart sounds. Respiratory: Normal respiratory effort.  No retractions. No audible wheezing. Gastrointestinal: Soft and nontender. No distention.  Musculoskeletal: Orthopedic boot is present on the left foot and lower leg. Neurologic: Moving all 4 extremities with no focal neurological deficits.  Unable to follow commands to perform an extensive neurological exam. Skin:  Skin is warm, dry and intact. No rash noted.  ____________________________________________   LABS (all labs ordered are listed, but only abnormal results are displayed)  Labs  Reviewed  CBC WITH DIFFERENTIAL/PLATELET - Abnormal; Notable for the following components:      Result Value   Hemoglobin 11.4 (*)    Abs Immature Granulocytes 0.08 (*)    All other components within normal limits  COMPREHENSIVE METABOLIC PANEL - Abnormal; Notable for the following components:   Glucose, Bld 145 (*)    Creatinine, Ser 1.51 (*)    GFR calc non Af Amer 40 (*)    GFR calc Af Amer 46 (*)    All other components within normal limits  URINALYSIS, ROUTINE W REFLEX MICROSCOPIC - Abnormal; Notable for the following components:   Color, Urine AMBER (*)    APPearance CLOUDY (*)    Bilirubin Urine SMALL (*)    Ketones, ur 5 (*)    Protein, ur 100 (*)    All other components within normal limits  URINE DRUG SCREEN, QUALITATIVE ([facility] ONLY) - Abnormal; Notable for the following components:   Tricyclic, Ur Screen POSITIVE (*)    Amphetamines, Ur Screen POSITIVE (*)    All other components within normal limits  GLUCOSE, CAPILLARY - Abnormal; Notable for the following components:   Glucose-Capillary 130 (*)    All other components within normal limits  SARS CORONAVIRUS 2 (HOSPITAL ORDER, [place] [facility])  ETHANOL  PROTIME-INR  APTT   ____________________________________________  EKG  None - EKG not  ordered by ED physician ____________________________________________  RADIOLOGY   ED MD interpretation: No acute abnormality on head CT.  Official radiology report(s): Ct Head Wo Contrast  Result Date: 04/10/2019 CLINICAL DATA:  Altered mental status EXAM: CT HEAD WITHOUT CONTRAST TECHNIQUE: Contiguous axial images were obtained from the base of the skull through the vertex without intravenous contrast. COMPARISON:  03/10/2019 FINDINGS: Brain: No evidence of acute infarction, hemorrhage, hydrocephalus, extra-axial collection or mass lesion/mass effect. Vascular: No hyperdense vessel or unexpected calcification. Skull: Normal. Negative for fracture or focal lesion. Sinuses/Orbits: No acute finding. IMPRESSION: Negative head CT. Electronically Signed   By: Monte Fantasia M.D.   On: 04/10/2019 05:27    ____________________________________________   PROCEDURES   Procedure(s) performed (including Critical Care):  Procedures   ____________________________________________   INITIAL IMPRESSION / MDM / [place] / ED COURSE  As part of my medical decision making, I reviewed the following data within the [place] notes reviewed and incorporated, Labs reviewed , Old chart reviewed, Notes from prior ED visits and [place] Controlled Substance Database   Differential diagnosis includes, but is not limited to, medication or drug side effect, metabolic or electrolyte abnormality, acute intracranial bleed, acute infection.  The patient is acting as if she is under the influence and is reporting no pain or shortness of breath.  She was able to settle down after going to the bathroom and go immediately to sleep.  I am evaluating her broadly including lab work and urine studies and I will also obtain a head CT.  I looked through the medical record and see that she goes to [facility].  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  I also see that she was seen recently for a left foot fracture which explains the orthopedic boot.  I will not administer any medication at this point and will reassess after her work-up is complete.  Given the altered mental status and unclear disposition I am  checking a coronavirus swab as well.      Clinical Course as of Apr 09 706  Fri Apr 10, 2019  0558 negative head ct  CT Head Wo Contrast [CF]  0603 SARS Coronavirus 2: NEGATIVE [CF]  0603 Amphetamines, Ur Screen(!): POSITIVE [CF]  [id number] The patient's medical work-up has been reassuring other than a drug screen positive for amphetamines and tricyclics.  It is unclear if this is secondary to substance abuse or medications.  She has been sleeping ever since she initially came in.   [CF]  0650 I reassessed the patient and she says she feels very well.  She is speaking clearly and says I am "very ready" to go home.  I will prepare her discharge paperwork and I gave my usual customary return precautions.   [CF]    Clinical Course User Index [CF] Hinda Kehr, MD     ____________________________________________  FINAL CLINICAL IMPRESSION(S) / ED DIAGNOSES  Final diagnoses:  Transient alteration of awareness     MEDICATIONS GIVEN DURING THIS VISIT:  Medications - No data to display   ED Discharge Orders    None      *Please note:  Kimberly Herring was evaluated in Emergency Department on 04/10/2019 for the symptoms described in the history of present illness. She was evaluated in the context of the global COVID-19 pandemic, which necessitated consideration that the patient might be at risk for infection with the SARS-CoV-2 virus that causes COVID-19. Institutional protocols and algorithms that pertain to the evaluation of patients at risk for COVID-19 are in a state of rapid change based on information released by regulatory bodies including  the CDC and federal and state organizations. These policies and algorithms were followed during the patient's care in the ED.  Some ED evaluations and interventions may be delayed as a result of limited staffing during the pandemic.*  Note:  This document was prepared using Dragon voice recognition software and may include unintentional dictation errors.   Hinda Kehr, MD 04/10/19 [phone]

## 2019-04-10 NOTE — ED Notes (Signed)
Pt returns to stretcher without success at urinating; pt appears sleepy, eyes closed and cont to have slurred speech

## 2019-04-10 NOTE — ED Notes (Signed)
I&O cath performed to collect urine specimen; pt tolerated well

## 2019-04-10 NOTE — ED Notes (Addendum)
Pt assisted to room commode again at request to urinate; pt able to to ambulate self without difficulty

## 2019-04-10 NOTE — Discharge Instructions (Addendum)
Your workup in the Emergency Department today was reassuring.  We did not find any specific abnormalities.  We recommend you drink plenty of fluids, take your regular medications and/or any new ones prescribed today, and follow up with the doctor(s) listed in these documents as recommended.  Return to the Emergency Department if you develop new or worsening symptoms that concern you.  

## 2019-04-10 NOTE — ED Notes (Signed)
Pt alert and oriented. NAD

## 2019-04-10 NOTE — ED Triage Notes (Signed)
Pt to room 15 via EMS from home; st called 911 for intruder in house but no one found & pt appears altered

## 2019-04-10 NOTE — ED Notes (Signed)
Ret to room from CT; pt sleeping soundly, resp even/unlab with no distress noted

## 2019-04-10 NOTE — ED Notes (Signed)
Attempted to call pts mother for ride home. No answer.

## 2019-04-23 ENCOUNTER — Telehealth: Payer: Self-pay

## 2019-04-23 NOTE — Telephone Encounter (Signed)
NOTES ON FILE FROM [facility] [phone] REFERRAL SENT TO SCHEDULING

## 2019-04-24 ENCOUNTER — Ambulatory Visit (INDEPENDENT_AMBULATORY_CARE_PROVIDER_SITE_OTHER): Payer: BC Managed Care – PPO | Admitting: Orthopaedic Surgery

## 2019-04-24 ENCOUNTER — Ambulatory Visit: Payer: Self-pay

## 2019-04-24 ENCOUNTER — Other Ambulatory Visit: Payer: Self-pay

## 2019-04-24 VITALS — Ht 63.75 in | Wt 246.0 lb

## 2019-04-24 DIAGNOSIS — M25561 Pain in right knee: Secondary | ICD-10-CM

## 2019-04-24 DIAGNOSIS — G8929 Other chronic pain: Secondary | ICD-10-CM

## 2019-04-24 DIAGNOSIS — M25562 Pain in left knee: Secondary | ICD-10-CM | POA: Diagnosis not present

## 2019-04-24 NOTE — Progress Notes (Signed)
Office Visit Note   Patient: Kimberly Herring           Date of Birth: 26-Aug-1966           MRN: [id number] Visit Date: 04/24/2019              Requested by: Verdie Shire, MD [address],  [place] [id number] PCP: Verdie Shire, MD   Assessment & Plan: Visit Diagnoses:  1. 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  After questioning it sounds like she has a nickel allergy.  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  We will communicate with her hematologist on her upcoming surgery.  She understands her BMI is slightly greater than 40 which puts her at a slightly increased risk for perioperative complications but she is willing to accept those risks and proceed with surgery as attempts at weight loss have been unsuccessful.  I have asked her to contact us once she is healed and recovered from the ankle fracture so that we can schedule her right knee replacement.  Questions encouraged and answered.  Follow-Up Instructions: Return if symptoms worsen or fail to improve.   Orders:  Orders Placed This Encounter  Procedures  . XR KNEE 3 VIEW LEFT  . XR KNEE 3 VIEW RIGHT   No orders of the defined types were placed in this encounter.     Procedures: No procedures performed   Clinical Data: No additional findings.   Subjective: Chief Complaint  Patient presents with  . Right Knee - Pain  . Left Knee - Pain    Kimberly Herring is a 53 year old female comes in for bilateral knee pain worse on the right for several years.  She has undergone extensive conservative treatment for knee pain in terms of cortisone injections, viscosupplementation injections, knee scopes and microfracture, weight loss which have all been unsuccessful.  She did recently fracture left ankle for which she is in a cam boot and with crutches and her follow-up is  at the end of August for this at [facility].  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  This is managed by Dr. Sterling Big at [facility].  She has severe difficulty with ADLs.  She works as an Careers information officer.  She also has chronic back pain.   Review of Systems  Constitutional: Negative.   HENT: Negative.   Eyes: Negative.   Respiratory: Negative.   Cardiovascular: Negative.   Endocrine: Negative.   Musculoskeletal: Negative.   Neurological: Negative.   Hematological: Negative.   Psychiatric/Behavioral: Negative.   All other systems reviewed and are negative.    Objective: Vital Signs: Ht 5' 3.75" (1.619 m)   Wt 246 lb (111.6 kg)   BMI 42.56 kg/m   Physical Exam Vitals signs and nursing note reviewed.  Constitutional:      Appearance: She is well-developed.  HENT:     Head: Normocephalic and atraumatic.  Neck:     Musculoskeletal: Neck supple.  Pulmonary:     Effort: Pulmonary effort is normal.  Abdominal:     Palpations: Abdomen is soft.  Skin:    General: Skin is warm.     Capillary Refill: Capillary refill takes less than 2 seconds.  Neurological:     Mental Status: She is alert and oriented to person,  place, and time.  Psychiatric:        Behavior: Behavior normal.        Thought Content: Thought content normal.        Judgment: Judgment normal.     Ortho Exam Bilateral knee exam shows no joint effusion.  Normal range of motion collaterals and cruciates are stable.  Overall varus alignment.  2+ patellofemoral crepitus. Specialty Comments:  No specialty comments available.  Imaging: Xr Knee 3 View Left  Result Date: 04/24/2019 End-stage tricompartmental degenerative joint disease with varus alignment  Xr Knee 3 View Right  Result Date: 04/24/2019 End-stage tricompartmental degenerative joint disease with varus alignment    PMFS History: There are no active problems to display for this  patient.  Past Medical History:  Diagnosis Date  . Anxiety   . Arthritis   . Bipolar 2 disorder ([place])   . Depression   . Fibromyalgia   . Migraine aura, persistent     No family history on file.  Past Surgical History:  Procedure Laterality Date  . ABDOMINAL HYSTERECTOMY    . CESAREAN SECTION    . ESOPHAGOGASTRODUODENOSCOPY    . FOOT SURGERY Bilateral   . GASTRIC BYPASS    . HERNIA REPAIR    . HIP SURGERY    . KNEE SURGERY Bilateral   . LAPAROSCOPIC OOPHERECTOMY    . SHOULDER SURGERY Bilateral   . TONSILLECTOMY    . TUBAL LIGATION     Social History   Occupational History  . Not on file  Tobacco Use  . Smoking status: Never Smoker  . Smokeless tobacco: Never Used  Substance and Sexual Activity  . Alcohol use: Yes  . Drug use: No  . Sexual activity: Not on file

## 2019-06-22 DIAGNOSIS — R600 Localized edema: Secondary | ICD-10-CM | POA: Insufficient documentation

## 2019-06-22 DIAGNOSIS — I10 Essential (primary) hypertension: Secondary | ICD-10-CM | POA: Insufficient documentation

## 2019-06-22 DIAGNOSIS — R5383 Other fatigue: Secondary | ICD-10-CM | POA: Insufficient documentation

## 2019-06-22 NOTE — Progress Notes (Signed)
Cardiology Office Note  Date:  06/23/2019   ID:  Kimberly Herring, DOB 1965/11/27, MRN [id number]  PCP:  Verdie Shire, MD   Chief Complaint  Patient presents with  . New Patient (Initial Visit)    Patient needs cardiac clearance. Patient denies chest pain and SOB at this time. Meds reviewed verbally with patient.     HPI:  Ms. 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 Verdie Shire for cardiac clearance for TKR bilateral lower extremity edema fatigue hypertension  Reports that she had a recent fall, ankle fracture Slow recovery, wore a brace  Since her gastric bypass surgery 2002, weight has continue to trend upwards Attributes weight gain to various medications  She would like to have total knee replacement on the right perhaps in November 2020  Denies significant chest pain on exertion,  She is troubled by some lower extremity edema  Scheduled to have back procedure next week Had procedure in the past on one side, now scheduled to have procedure on the other side, radiofrequency treatment  Declined EKG today Prior EKG reviewed with her in detail from June 2020 Showing normal sinus rhythm rate 91 bpm no significant ST-T wave changes    PMH:   has a past medical history of Anxiety, Arthritis, Bipolar 2 disorder ([place]), Depression, Fibromyalgia, and Migraine aura, persistent.  PSH:    Past Surgical History:  Procedure Laterality Date  . ABDOMINAL HYSTERECTOMY    . CESAREAN SECTION    . ESOPHAGOGASTRODUODENOSCOPY    . FOOT SURGERY Bilateral   . GASTRIC BYPASS    . HERNIA REPAIR    . HIP SURGERY    . KNEE SURGERY Bilateral   . LAPAROSCOPIC OOPHERECTOMY    . SHOULDER SURGERY Bilateral   . TONSILLECTOMY    . TUBAL LIGATION      Current Outpatient Medications  Medication Sig Dispense Refill  . albuterol (PROVENTIL HFA;VENTOLIN HFA) 108 (90 BASE)  MCG/ACT inhaler Inhale 2 puffs into the lungs every 6 (six) hours as needed for wheezing or shortness of breath.    . cetirizine (ZYRTEC) 10 MG tablet Take 10 mg by mouth daily.    . DULoxetine (CYMBALTA) 60 MG capsule Take 120 mg by mouth daily.    . fluticasone (FLONASE) 50 MCG/ACT nasal spray Place 1 spray into both nostrils 2 (two) times daily. 16 g 0  . fluticasone-salmeterol (ADVAIR HFA) 115-21 MCG/ACT inhaler Inhale 2 puffs into the lungs 2 (two) times daily.    Marland Kitchen gabapentin (NEURONTIN) 300 MG capsule Take 300 mg by mouth 3 (three) times daily.    . Multiple Vitamin (MULTIVITAMIN) tablet Take 1 tablet by mouth daily.    . ondansetron (ZOFRAN-ODT) 4 MG disintegrating tablet Take 4 mg by mouth every 8 (eight) hours as needed for nausea or vomiting.     No current facility-administered medications for this visit.      Allergies:   Bactrim [sulfamethoxazole-trimethoprim], Ambien [zolpidem], Depakene [valproic acid], Flagyl [metronidazole], Lyrica [pregabalin], Percocet [oxycodone-acetaminophen], Compazine [prochlorperazine], and Lamictal [lamotrigine]   Social History:  The patient  reports that she has never smoked. She has never used smokeless tobacco. She reports current alcohol use. She reports that she does not use drugs.   Family History:   family history is not on file.    Review of Systems: Review of Systems  Constitutional: Negative.   HENT: Negative.   Respiratory:  Negative.   Cardiovascular: Negative.   Gastrointestinal: Negative.   Musculoskeletal: Negative.   Neurological: Negative.   Psychiatric/Behavioral: Negative.   All other systems reviewed and are negative.   PHYSICAL EXAM: VS:  BP 120/66 (BP Location: Left Arm, Patient Position: Sitting, Cuff Size: Normal)   Pulse 87   Ht 5\' 3"  (1.6 m)   Wt 246 lb 6 oz (111.8 kg)   SpO2 98%   BMI 43.64 kg/m  , BMI Body mass index is 43.64 kg/m. GEN: Well nourished, well developed, in no acute distress HEENT:  normal Neck: no JVD, carotid bruits, or masses Cardiac: RRR; no murmurs, rubs, or gallops,no edema  Respiratory:  clear to auscultation bilaterally, normal work of breathing GI: soft, nontender, nondistended, + BS MS: no deformity or atrophy Skin: warm and dry, no rash Neuro:  Strength and sensation are intact Psych: euthymic mood, full affect   Recent Labs: 03/10/2019: TSH 1.691 04/10/2019: ALT 20; BUN 14; Creatinine, Ser 1.51; Hemoglobin 11.4; Platelets 255; Potassium 4.3; Sodium 140    Lipid Panel No results found for: CHOL, HDL, LDLCALC, TRIG    Wt Readings from Last 3 Encounters:  06/23/19 246 lb 6 oz (111.8 kg)  04/24/19 246 lb (111.6 kg)  04/10/19 253 lb 8.5 oz (115 kg)      ASSESSMENT AND PLAN:  Problem List Items Addressed This Visit      Cardiology Problems   Essential hypertension     Other   Leg edema   Fatigue     Preop cardiovascular evaluation For total knee replacement She would like to have this done in November of this year Echocardiogram pending, no further cardiac testing needed at this time Acceptable risk  Lower extremity edema Etiology unclear, we have ordered echocardiogram Most consistent with venous insufficiency, recommended compression hose Unable to exclude component of fluid retention Denies significant shortness of breath For elevated right heart pressures on echo, could consider starting Lasix  Fatigue Chronic issue, likely exacerbated by chronic pain Unable to perform regular exercise program  Morbid obesity Diet discussed with her Recommended low carbohydrates Sedentary secondary to chronic back pain knee pain Activity should improve after knee surgery  Preventive care Discussed glucose numbers with her, Reasonable level by lab work June 2020 Elevated in July though unclear if this was fasting Recommended weight loss -one elevated creatinine recently though prior to that was normal  Disposition:   F/U as needed    Total encounter time more than 60 minutes  Greater than 50% was spent in counseling and coordination of care with the patient  Patient was seen in consultation for Lauren Bowen and will be referred back to her office for ongoing care of the issues detailed above  Signed, Esmond Plants, M.D., Ph.D. [place], [place]

## 2019-06-23 ENCOUNTER — Other Ambulatory Visit: Payer: Self-pay

## 2019-06-23 ENCOUNTER — Ambulatory Visit (INDEPENDENT_AMBULATORY_CARE_PROVIDER_SITE_OTHER): Payer: BC Managed Care – PPO | Admitting: Cardiovascular Disease

## 2019-06-23 ENCOUNTER — Encounter: Payer: Self-pay | Admitting: Cardiovascular Disease

## 2019-06-23 VITALS — BP 120/66 | HR 87 | Ht 63.0 in | Wt 246.4 lb

## 2019-06-23 DIAGNOSIS — R600 Localized edema: Secondary | ICD-10-CM

## 2019-06-23 DIAGNOSIS — I10 Essential (primary) hypertension: Secondary | ICD-10-CM

## 2019-06-23 DIAGNOSIS — Z01810 Encounter for preprocedural cardiovascular examination: Secondary | ICD-10-CM | POA: Diagnosis not present

## 2019-06-23 DIAGNOSIS — R5382 Chronic fatigue, unspecified: Secondary | ICD-10-CM | POA: Diagnosis not present

## 2019-06-23 NOTE — Patient Instructions (Addendum)
Medication Instructions:  No changes  If you need a refill on your cardiac medications before your next appointment, please call your pharmacy.    Lab work: No new labs needed   If you have labs (blood work) drawn today and your tests are completely normal, you will receive your results only by: Marland Kitchen MyChart Message (if you have MyChart) OR . A paper copy in the mail If you have any lab test that is abnormal or we need to change your treatment, we will call you to review the results.   Testing/Procedures: Echocardiogram for leg swelling, preop surgery Your physician has requested that you have an echocardiogram. Echocardiography is a painless test that uses sound waves to create images of your heart. It provides your doctor with information about the size and shape of your heart and how well your heart's chambers and valves are working. This procedure takes approximately one hour. There are no restrictions for this procedure. You may get an IV, if needed, to receive an ultrasound enhancing agent through to better visualize your heart.    Follow-Up: At [facility], you and your health needs are our priority.  As part of our continuing mission to provide you with exceptional heart care, we have created designated Provider Care Teams.  These Care Teams include your primary Cardiologist (physician) and Advanced Practice Providers (APPs -  Physician Assistants and Nurse Practitioners) who all work together to provide you with the care you need, when you need it.  . You will need a follow up appointment as needed  . Providers on your designated Care Team:   . Murray Hodgkins, NP . Christell Faith, PA-C . Marrianne Mood, PA-C  Any Other Special Instructions Will Be Listed Below (If Applicable).  For educational health videos Log in to : [email or web address] Or : [email or web address], password : [email or web address]    Echocardiogram An echocardiogram is a procedure that uses painless sound waves (ultrasound)  to produce an image of the heart. Images from an echocardiogram can provide important information about:  Signs of coronary artery disease (CAD).  Aneurysm detection. An aneurysm is a weak or damaged part of an artery wall that bulges out from the normal force of blood pumping through the body.  Heart size and shape. Changes in the size or shape of the heart can be associated with certain conditions, including heart failure, aneurysm, and CAD.  Heart muscle function.  Heart valve function.  Signs of a past heart attack.  Fluid buildup around the heart.  Thickening of the heart muscle.  A tumor or infectious growth around the heart valves. Tell a health care provider about:  Any allergies you have.  All medicines you are taking, including vitamins, herbs, eye drops, creams, and over-the-counter medicines.  Any blood disorders you have.  Any surgeries you have had.  Any medical conditions you have.  Whether you are pregnant or may be pregnant. What are the risks? Generally, this is a safe procedure. However, problems may occur, including:  Allergic reaction to dye (contrast) that may be used during the procedure. What happens before the procedure? No specific preparation is needed. You may eat and drink normally. What happens during the procedure?   An IV tube may be inserted into one of your veins.  You may receive contrast through this tube. A contrast is an injection that improves the quality of the pictures from your heart.  A gel will be applied to your chest.  A wand-like tool (transducer) will be  moved over your chest. The gel will help to transmit the sound waves from the transducer.  The sound waves will harmlessly bounce off of your heart to allow the heart images to be captured in real-time motion. The images will be recorded on a computer. The procedure may vary among health care providers and hospitals. What happens after the procedure?  You may return  to your normal, everyday life, including diet, activities, and medicines, unless your health care provider tells you not to do that. Summary  An echocardiogram is a procedure that uses painless sound waves (ultrasound) to produce an image of the heart.  Images from an echocardiogram can provide important information about the size and shape of your heart, heart muscle function, heart valve function, and fluid buildup around your heart.  You do not need to do anything to prepare before this procedure. You may eat and drink normally.  After the echocardiogram is completed, you may return to your normal, everyday life, unless your health care provider tells you not to do that. This information is not intended to replace advice given to you by your health care provider. Make sure you discuss any questions you have with your health care provider. Document Released: 09/07/2000 Document Revised: 01/01/2019 Document Reviewed: 10/13/2016 Elsevier Patient Education  2020 [employer].

## 2019-07-03 ENCOUNTER — Ambulatory Visit (INDEPENDENT_AMBULATORY_CARE_PROVIDER_SITE_OTHER): Payer: BC Managed Care – PPO

## 2019-07-03 ENCOUNTER — Other Ambulatory Visit: Payer: Self-pay

## 2019-07-03 DIAGNOSIS — Z01810 Encounter for preprocedural cardiovascular examination: Secondary | ICD-10-CM

## 2019-07-03 DIAGNOSIS — R600 Localized edema: Secondary | ICD-10-CM

## 2019-07-08 ENCOUNTER — Telehealth: Payer: Self-pay | Admitting: Cardiovascular Disease

## 2019-07-08 NOTE — Telephone Encounter (Signed)
Notes recorded by Minna Merritts, MD on 07/05/2019 at 10:22 PM EDT  Echo  Normal cardiac function, valves

## 2019-07-08 NOTE — Telephone Encounter (Signed)
Attempted to call the patient as she has not viewed her results in [place].  No answer- I left a message to please call back.

## 2019-07-08 NOTE — Telephone Encounter (Signed)
I spoke with the patient regarding her results.  

## 2019-07-23 ENCOUNTER — Encounter: Payer: Self-pay | Admitting: Orthopaedic Surgery

## 2020-04-12 ENCOUNTER — Other Ambulatory Visit: Payer: Self-pay

## 2020-04-12 ENCOUNTER — Ambulatory Visit: Payer: BC Managed Care – PPO | Attending: Obstetrics and Gynecology

## 2020-04-12 DIAGNOSIS — M62838 Other muscle spasm: Secondary | ICD-10-CM | POA: Insufficient documentation

## 2020-04-12 DIAGNOSIS — R278 Other lack of coordination: Secondary | ICD-10-CM | POA: Insufficient documentation

## 2020-04-12 DIAGNOSIS — R293 Abnormal posture: Secondary | ICD-10-CM | POA: Diagnosis present

## 2020-04-12 DIAGNOSIS — M4125 Other idiopathic scoliosis, thoracolumbar region: Secondary | ICD-10-CM | POA: Diagnosis present

## 2020-04-12 NOTE — Therapy (Signed)
[place] MAIN [facility] SERVICES [address] [place], [place], [address] Phone: [phone]   Fax:  [phone]  Physical Therapy Evaluation  The patient has been informed of current processes in place at Outpatient Rehab to protect patients from Covid-19 exposure including social distancing, schedule modifications, and new cleaning procedures. After discussing their particular risk with a therapist based on the patient's personal risk factors, the patient has decided to proceed with in-person therapy.   Patient Details  Name: Kimberly Herring MRN: [id number] Date of Birth: [birth year] No data recorded  Encounter Date: 04/12/2020   PT End of Session - 04/17/20 1322    Visit Number 1    Number of Visits 10    Date for PT Re-Evaluation 06/26/20    Authorization Type BCBS    Authorization Time Period 04/12/20 through 10/3 21    Authorization - Visit Number 1    Authorization - Number of Visits 10    Progress Note Due on Visit 10    PT Start Time 1600    PT Stop Time 1700    PT Time Calculation (min) 60 min    Activity Tolerance Patient tolerated treatment well;No increased pain    Behavior During Therapy WFL for tasks assessed/performed           Past Medical History:  Diagnosis Date  . Anxiety   . Arthritis   . Bipolar 2 disorder ([place])   . Depression   . Fibromyalgia   . Migraine aura, persistent     Past Surgical History:  Procedure Laterality Date  . ABDOMINAL HYSTERECTOMY    . CESAREAN SECTION    . ESOPHAGOGASTRODUODENOSCOPY    . FOOT SURGERY Bilateral   . GASTRIC BYPASS    . HERNIA REPAIR    . HIP SURGERY    . KNEE SURGERY Bilateral   . LAPAROSCOPIC OOPHERECTOMY    . SHOULDER SURGERY Bilateral   . TONSILLECTOMY    . TUBAL LIGATION      There were no vitals filed for this visit.    Pelvic Floor Physical Therapy Evaluation and Assessment  SCREENING  Falls in last 6 mo: no  Red Flags: none Have you had any night  sweats? no Unexplained weight loss? no Saddle anesthesia? Yes,  2-3 years Unexplained changes in bowel or bladder habits? FI started suddenly over the past 3-4 months  SUBJECTIVE  Patient reports: Has Pain in the R hip that causes difficulty walking. It has progressively gotten worse over the last 4-5 months.  It started in 2002. once she had her "tubes and ovaries removed" and she should have had PT then but she didn't stay because they said they needed to do an internal exam. Pain is achy and constant with sharp shooting pains.   Precautions:  Anxiety, Arthritis, Bipolar disorder, Depression, Fibromyalgia, Migraines,Had an abdominal hysterectomy, c-section, B hammer-toe repair, gastric bypass (needs revision), hernia repair, hip-surgery (labral repair), B knee surgery to regenerate meniscus, B oophorectomy, B shoulder surgeries for rotator cuff repair in 2012 and 2016, tonsillectomy, tubal ligation.  Social/Family/Vocational History:   Full time [employer].  Recent Procedures/Tests/Findings:  MRI 7/14: The bilateral hip spaces are preserved with mild bilateral superolateral acetabular osteophytosis. The sacroiliac are normal. Degenerative changes of the visualized lower lumbar spine.  Has been told her LLE is shorter.  Obstetrical History: 1 vaginal delivery, 1 c-section.  Gynecological History: Hysterectomy due to heavy bleeding and chronic pain. Had bleeding again so they did the  oophorectomy due to endometriosis bleeding.  Urinary History: Started having UUI ~ 2 years ago. Is using a 1 or 2 incontinence pad and her underwear but not pants get wet.  Gastrointestinal History: Bowel urgency started ~ 6-8 months ago and has been constipated for ~ 5 years. Just started having FI in the last 4 months, it has happened 3 times over 4 months.  Sexual activity/pain: N/A not sexually active.  Location of pain: Lower abdomen/pelvis and vagina Current pain:  4/10   Max pain:  10/10 Least pain:  3/10 Nature of pain: "squeezing" achy with sharp jolts.  Location of pain: R hip Current pain:  5/10  Max pain:  10/10 Least pain:  5/10 Nature of pain: "squeezing" achy with sharp jolts.  Patient Goals: Wants to not have pain with ADL's.    OBJECTIVE  Posture/Observations:  Sitting:  Standing: R shoulder high (r handed), R foot ER'ed, forward lean, anterior pelvic tilt, weight on toes. R facing pelvis, L PSIS deeper/higher.   Palpation/Segmental Motion/Joint Play: TTP to R>L adductors, hip-flexors, QL.  Special tests:   Scoliosis: L lumbar/R thoracic Supine-to-long-sit: LLE long in both (even longer in sitting)  Range of Motion/Flexibilty:  Spine: SB ~ 3 fingers from knee B with pain on R B, B ROT ~ 50% reduced with increased pain on R during both. Hips:   Strength/MMT: Deferred to follow-up LE MMT  LE MMT Left Right  Hip flex:  (L2) /5 /5  Hip ext: /5 /5  Hip abd: /5 /5  Hip add: /5 /5  Hip IR /5 /5  Hip ER /5 /5     Abdominal:  Palpation: TTP to R>L Pectineus, Iliacus and QL Diastasis: ~ 3 fingers above and 2 fingers below umbilicus (mesh in place)  Pelvic Floor External Exam: Deferred to follow-up Introitus Appears:  Skin integrity:  Palpation: Cough: Prolapse visible?: Scar mobility:  Internal Vaginal Exam: Strength (PERF):  Symmetry: Palpation: Prolapse:   Internal Rectal Exam: Strength (PERF): Symmetry: Palpation: Prolapse:   Gait Analysis: Deferred to follow-up, Pt. Arrived in w/c   Pelvic Floor Outcome Measures: Do FOTO at visit 2.  INTERVENTIONS THIS SESSION: Self-care: Educated on the structure and function of the pelvic floor in relation to their symptoms as well as the POC, and initial HEP in order to set patient expectations and understanding from which we will build on in the future sessions.  Total time: 60 min.                   Objective measurements completed on  examination: See above findings.                 PT Short Term Goals - 04/17/20 1339      PT SHORT TERM GOAL #1   Title Patient will demonstrate improved pelvic alignment and balance of musculature surrounding the pelvis to facilitate decreased PFM spasms and decrease pelvic pain.    Baseline R anterior rotation and up-slip, L lumbar/R thoracic scoliosis. Obese, anterior pelvic tilt and spasms surrounding R>L hip    Time 5    Period Weeks    Status New    Target Date 05/22/20      PT SHORT TERM GOAL #2   Title Patient will report a reduction in pain to no greater than 6/10 over the prior week to demonstrate symptom improvement.    Baseline 10/10 max    Time 5    Period Weeks    Status New  Target Date 05/22/20      PT SHORT TERM GOAL #3   Title Patient will demonstrate HEP x1 in the clinic to demonstrate understanding and proper form to allow for further improvement.    Baseline Pt. lacks knowledge of which exercises will help decrease her Sx.    Time 5    Period Weeks    Status New    Target Date 05/22/20             PT Long Term Goals - 04/17/20 1344      PT LONG TERM GOAL #1   Title Patient will report no episodes of SUI/FI over the course of the prior two weeks to demonstrate improved functional ability.    Baseline UUI requiring # 1 or 2 incontinence pad (more when active). FI occurred ~ 3 times over 4 months at night.    Time 10    Period Weeks    Status New    Target Date 06/26/20      PT LONG TERM GOAL #2   Title Patient will describe pain no greater than 2/10 during a full day of ADL's to demonstrate improved functional ability.    Baseline 10/10 max, 5/10 min.    Time 10    Period Weeks    Status New    Target Date 06/26/20      PT LONG TERM GOAL #3   Title Pt. Will demonstrate a reduction in FOTO score by 10 points in each category to demonstrate improved functional ablility.    Baseline Pt. to fill out FOTO at visit #2    Time 10     Period Weeks    Status New    Target Date 06/26/20                  Plan - 04/17/20 1323    Clinical Impression Statement Pt. is a 54 y/o female who presents today with cheif c/o pelvic pain, UUI, fecal urgency and FI at night. Her PMH is significant for Anxiety, Arthritis, Bipolar disorder, Depression, Fibromyalgia, Migraines,Had an abdominal hysterectomy, c-section, B hammer-toe repair, gastric bypass (needs revision), hernia repair, hip-surgery (labral repair), B knee surgery to regenerate meniscus, B oophorectomy, B shoulder surgeries for rotator cuff repair in 2012 and 2016, tonsillectomy, and  tubal ligation. Her Clinical exam revealed a R anterior rotation and up-slip, L lumbar/R thoracic scoliosis, anterior pelvic tilt and spasms surrounding R>L hip with report of PFM spasms from referring provider. She will benefit from skilled pelvic health PT to address the noted deficits and to assess for and treat any other potential cause of Sx,    Personal Factors and Comorbidities Comorbidity 3+;Fitness    Comorbidities Anxiety, Arthritis, Bipolar disorder, Depression, Fibromyalgia, Migraines,Had an abdominal hysterectomy, c-section, B hammer-toe repair, gastric bypass (needs revision), hernia repair, hip-surgery (labral repair), B knee surgery to regenerate meniscus, B oophorectomy, B shoulder surgeries for rotator cuff repair in 2012 and 2016, tonsillectomy, and  tubal ligation    Examination-Activity Limitations Lift;Stairs;Continence;Bend;Locomotion Level    Examination-Participation Restrictions Yard Work;Cleaning;Laundry;Community Activity    Stability/Clinical Decision Making Unstable/Unpredictable    Clinical Decision Making High    Rehab Potential Fair    PT Frequency 1x / week    PT Duration Other (comment)   10 weeks   PT Treatment/Interventions ADLs/Self Care Home Management;Biofeedback;Moist Heat;Electrical Stimulation;Traction;Ultrasound;Therapeutic activities;Functional  mobility training;Stair training;Gait training;Therapeutic exercise;Balance training;Neuromuscular re-education;Orthotic Fit/Training;Patient/family education;Manual techniques;Dry needling;Taping;Spinal Manipulations;Joint Manipulations    PT Next Visit Plan TPDN/TP release, Correct  obliquity, re-measure/assess for LLD functionally.    Consulted and Agree with Plan of Care Patient           Patient will benefit from skilled therapeutic intervention in order to improve the following deficits and impairments:  Abnormal gait, Decreased endurance, Difficulty walking, Increased muscle spasms, Obesity, Improper body mechanics, Impaired tone, Pain, Postural dysfunction, Increased fascial restricitons, Decreased strength, Decreased coordination, Decreased activity tolerance  Visit Diagnosis: Other idiopathic scoliosis, thoracolumbar region  Abnormal posture  Other muscle spasm  Other lack of coordination     Problem List Patient Active Problem List   Diagnosis Date Noted  . Leg edema 06/22/2019  . Essential hypertension 06/22/2019  . Fatigue 06/22/2019   Willa Rough DPT, ATC Willa Rough 04/17/2020, 1:57 PM  [place] MAIN [facility] SERVICES [address] [place], [place], [address] Phone: [phone]   Fax:  [phone]  Name: Kimberly Herring MRN: [id number] Date of Birth: [birth year]

## 2020-04-20 ENCOUNTER — Ambulatory Visit: Payer: BC Managed Care – PPO

## 2020-04-20 ENCOUNTER — Other Ambulatory Visit: Payer: Self-pay

## 2020-04-20 DIAGNOSIS — M4125 Other idiopathic scoliosis, thoracolumbar region: Secondary | ICD-10-CM

## 2020-04-20 DIAGNOSIS — R278 Other lack of coordination: Secondary | ICD-10-CM

## 2020-04-20 DIAGNOSIS — R293 Abnormal posture: Secondary | ICD-10-CM

## 2020-04-20 DIAGNOSIS — M62838 Other muscle spasm: Secondary | ICD-10-CM

## 2020-04-20 NOTE — Patient Instructions (Signed)
  Inhale as you drop your knees to the side, exhale to come back to the center. Do 2x15 on each side.    Let the top arm rest on your side, and slide along the torso as you rotate. Breathe in as you come forward, out as you open up. Do 2x15 on each side.    Hold for 30 seconds (5 deep breaths) and repeat 2-3 times on each side once a day    Bring feet together and let the knees fall out to the sides. Hold for 5 deep breaths, rest then repeat 2 more times.

## 2020-04-20 NOTE — Therapy (Signed)
[place] MAIN [facility] SERVICES [address] [place], [place], [address] Phone: [phone]   Fax:  [phone]  Physical Therapy Treatment  Patient Details  Name: Kimberly Herring MRN: [id number] Date of Birth: 1965-10-30 No data recorded  Encounter Date: 04/20/2020   PT End of Session - 04/20/20 1749    Visit Number 2    Number of Visits 10    Date for PT Re-Evaluation 06/26/20    Authorization Type BCBS    Authorization Time Period 04/12/20 through 10/3 21    Authorization - Visit Number 2    Authorization - Number of Visits 10    Progress Note Due on Visit 10    PT Start Time 0907    PT Stop Time 1007    PT Time Calculation (min) 60 min    Activity Tolerance Patient tolerated treatment well;No increased pain    Behavior During Therapy WFL for tasks assessed/performed           Past Medical History:  Diagnosis Date  . Anxiety   . Arthritis   . Bipolar 2 disorder ([place])   . Depression   . Fibromyalgia   . Migraine aura, persistent     Past Surgical History:  Procedure Laterality Date  . ABDOMINAL HYSTERECTOMY    . CESAREAN SECTION    . ESOPHAGOGASTRODUODENOSCOPY    . FOOT SURGERY Bilateral   . GASTRIC BYPASS    . HERNIA REPAIR    . HIP SURGERY    . KNEE SURGERY Bilateral   . LAPAROSCOPIC OOPHERECTOMY    . SHOULDER SURGERY Bilateral   . TONSILLECTOMY    . TUBAL LIGATION      There were no vitals filed for this visit.   Pelvic Floor Physical Therapy Treatment Note  SCREENING  Changes in medications, allergies, or medical history?: recently diagnosed with Hypoglycemia    SUBJECTIVE  Patient reports: No changes since prior visit.  Precautions:  Anxiety, Arthritis, Bipolar disorder, Depression, Fibromyalgia, Migraines,Had an abdominal hysterectomy, c-section, B hammer-toe repair, gastric bypass (needs revision), hernia repair, hip-surgery (labral repair), B knee surgery to regenerate meniscus, B oophorectomy, B  shoulder surgeries for rotator cuff repair in 2012 and 2016, tonsillectomy, tubal ligation.  Pain update:  Location of pain: Lower abdomen/pelvis and vagina Current pain: 4/10  Max pain: 10/10 Least pain: 3/10 Nature of pain:"squeezing" achy with sharp jolts.  Location of pain: LB and R hip Current pain:  8/10  Max pain:  10/10 Least pain:  7/10 Nature of pain: Sharp, dull, achy, shooting.  **3/10 following treatment  Patient Goals: Wants to not have pain with ADL's.    OBJECTIVE  Changes in: Posture/Observations:  R anterior rotation and up-slip, L lumbar/R thoracic scoliosis. Anterior pelvic tilt.  **Following treatment, PSIS appeared level in standing.  Range of Motion/Flexibilty:    Strength/MMT:  LE MMT:  Pelvic floor:  Abdominal:   Palpation: TTP to R QL, Iliacus and Psoas, Pectineus and adductor brevis.  Gait Analysis:  INTERVENTIONS THIS SESSION: Manual: Performed TP release to R QL, Iliacus and Psoas, Pectineus and adductor brevis followed by MET correction for R anterior rotation x2 to decrease spasm and pain and allow for improved balance of musculature and pelvic alignment for improved function and decreased symptoms.  Therex: educated on and practiced lumbar twists and bow-and-arrow to improve mobility of joint and surrounding connective tissue and decrease pressure on nerve roots for improved conductivity and function of down-stream tissues and side-stretch and butterfly stretch  To maintain and improve muscle length and allow for improved balance of musculature for long-term symptom relief.  Total time: 60 min.                       Trigger Point Dry Needling - 04/20/20 0001    Consent Given? Yes    Education Handout Provided No    Muscles Treated Lower Quadrant Adductor longus/brevis/magnus    Muscles Treated Back/Hip Iliopsoas;Quadratus lumborum    Dry Needling Comments Right    Adductor Response Twitch response  elicited;Palpable increased muscle length    Iliopsoas Response Twitch response elicited;Palpable increased muscle length    Quadratus Lumborum Response Twitch response elicited;Palpable increased muscle length                  PT Short Term Goals - 04/17/20 1339      PT SHORT TERM GOAL #1   Title Patient will demonstrate improved pelvic alignment and balance of musculature surrounding the pelvis to facilitate decreased PFM spasms and decrease pelvic pain.    Baseline R anterior rotation and up-slip, L lumbar/R thoracic scoliosis. Obese, anterior pelvic tilt and spasms surrounding R>L hip    Time 5    Period Weeks    Status New    Target Date 05/22/20      PT SHORT TERM GOAL #2   Title Patient will report a reduction in pain to no greater than 6/10 over the prior week to demonstrate symptom improvement.    Baseline 10/10 max    Time 5    Period Weeks    Status New    Target Date 05/22/20      PT SHORT TERM GOAL #3   Title Patient will demonstrate HEP x1 in the clinic to demonstrate understanding and proper form to allow for further improvement.    Baseline Pt. lacks knowledge of which exercises will help decrease her Sx.    Time 5    Period Weeks    Status New    Target Date 05/22/20             PT Long Term Goals - 04/17/20 1344      PT LONG TERM GOAL #1   Title Patient will report no episodes of SUI/FI over the course of the prior two weeks to demonstrate improved functional ability.    Baseline UUI requiring # 1 or 2 incontinence pad (more when active). FI occurred ~ 3 times over 4 months at night.    Time 10    Period Weeks    Status New    Target Date 06/26/20      PT LONG TERM GOAL #2   Title Patient will describe pain no greater than 2/10 during a full day of ADL's to demonstrate improved functional ability.    Baseline 10/10 max, 5/10 min.    Time 10    Period Weeks    Status New    Target Date 06/26/20      PT LONG TERM GOAL #3   Title Pt. Will  demonstrate a reduction in FOTO score by 10 points in each category to demonstrate improved functional ablility.    Baseline Pt. to fill out FOTO at visit #2    Time 10    Period Weeks    Status New    Target Date 06/26/20                 Plan - 04/20/20 1749  Clinical Impression Statement Pt. Responded well to all interventions today, demonstrating improved pelvic alignment, decreased spasm, and pain reduction from 8/10 to 3/10, allowing Pt. To leave with SPC rather than in a w/c, as well as understanding and correct performance of all education and exercises provided today. They will continue to benefit from skilled physical therapy to work toward remaining goals and maximize function as well as decrease likelihood of symptom increase or recurrence.    PT Next Visit Plan TPDN/TP release, re-measure/assess for LLD functionally, teach breathing/deep core recruitmetn and coordination, urge suppression technique.    PT Home Exercise Plan bow-and-arrow, lumbar rotations, side-stretch, butterfly adductor stretch.    Consulted and Agree with Plan of Care Patient           Patient will benefit from skilled therapeutic intervention in order to improve the following deficits and impairments:     Visit Diagnosis: Other idiopathic scoliosis, thoracolumbar region  Abnormal posture  Other muscle spasm  Other lack of coordination     Problem List Patient Active Problem List   Diagnosis Date Noted  . Leg edema 06/22/2019  . Essential hypertension 06/22/2019  . Fatigue 06/22/2019   Willa Rough DPT, ATC Willa Rough 04/20/2020, 5:51 PM  [place] MAIN [facility] SERVICES [address] [place], [place], [address] Phone: [phone]   Fax:  [phone]  Name: Kimberly Herring MRN: [id number] Date of Birth: Aug 31, 1966

## 2020-04-25 ENCOUNTER — Ambulatory Visit: Payer: BC Managed Care – PPO | Attending: Obstetrics and Gynecology

## 2020-04-25 DIAGNOSIS — R278 Other lack of coordination: Secondary | ICD-10-CM | POA: Insufficient documentation

## 2020-04-25 DIAGNOSIS — M4125 Other idiopathic scoliosis, thoracolumbar region: Secondary | ICD-10-CM | POA: Insufficient documentation

## 2020-04-25 DIAGNOSIS — M62838 Other muscle spasm: Secondary | ICD-10-CM | POA: Insufficient documentation

## 2020-04-25 DIAGNOSIS — R293 Abnormal posture: Secondary | ICD-10-CM | POA: Insufficient documentation

## 2020-04-28 ENCOUNTER — Ambulatory Visit: Payer: BC Managed Care – PPO

## 2020-04-28 ENCOUNTER — Other Ambulatory Visit: Payer: Self-pay

## 2020-04-28 DIAGNOSIS — M62838 Other muscle spasm: Secondary | ICD-10-CM

## 2020-04-28 DIAGNOSIS — R278 Other lack of coordination: Secondary | ICD-10-CM

## 2020-04-28 DIAGNOSIS — R293 Abnormal posture: Secondary | ICD-10-CM | POA: Diagnosis present

## 2020-04-28 DIAGNOSIS — M4125 Other idiopathic scoliosis, thoracolumbar region: Secondary | ICD-10-CM | POA: Diagnosis present

## 2020-04-28 NOTE — Patient Instructions (Signed)
  MET to Correct a Left Anteriorly Rotated/Right Posteriorly Rotated Innominate using a dowel   Begin on your back with the knees and hips bent to 90 degrees. Use a dowel or broomstick to go through the legs. Have the dowel behind the left leg and in front of the right. Stabilize the dowel on ether side with the hands. Press down with the left leg and up with the right and hold for 5 seconds. Slowly release back to the starting position. Repeat 5 times.

## 2020-04-28 NOTE — Therapy (Signed)
[place] MAIN [facility] SERVICES [address] [place], [place], [address] Phone: [phone]   Fax:  [phone]  Physical Therapy Treatment  The patient has been informed of current processes in place at Outpatient Rehab to protect patients from Covid-19 exposure including social distancing, schedule modifications, and new cleaning procedures. After discussing their particular risk with a therapist based on the patient's personal risk factors, the patient has decided to proceed with in-person therapy.   Patient Details  Name: Kimberly Herring MRN: [id number] Date of Birth: Mar 29, 1966 No data recorded  Encounter Date: 04/28/2020   PT End of Session - 04/28/20 1345    Visit Number 3    Number of Visits 10    Date for PT Re-Evaluation 06/26/20    Authorization Type BCBS    Authorization Time Period 04/12/20 through 10/3 21    Authorization - Visit Number 3    Authorization - Number of Visits 10    Progress Note Due on Visit 10    PT Start Time 0930    PT Stop Time 1030    PT Time Calculation (min) 60 min    Activity Tolerance Patient tolerated treatment well;No increased pain    Behavior During Therapy WFL for tasks assessed/performed           Past Medical History:  Diagnosis Date   Anxiety    Arthritis    Bipolar 2 disorder ([place])    Depression    Fibromyalgia    Migraine aura, persistent     Past Surgical History:  Procedure Laterality Date   ABDOMINAL HYSTERECTOMY     CESAREAN SECTION     ESOPHAGOGASTRODUODENOSCOPY     FOOT SURGERY Bilateral    GASTRIC BYPASS     HERNIA REPAIR     HIP SURGERY     KNEE SURGERY Bilateral    LAPAROSCOPIC OOPHERECTOMY     SHOULDER SURGERY Bilateral    TONSILLECTOMY     TUBAL LIGATION      There were no vitals filed for this visit.   Pelvic Floor Physical Therapy Treatment Note  SCREENING  Changes in medications, allergies, or medical history?: no   SUBJECTIVE  Patient  reports: She did well until Monday at which point she had a pain flare from over-doing it over the weekend shopping (8 hrs) on Saturday and going out to church and then lunch on Sunday. Monday she rebounded and had a pain flare that kept her out of work. LB has been doing pretty well.  Precautions:  Anxiety, Arthritis, Bipolar disorder, Depression, Fibromyalgia, Migraines,Had an abdominal hysterectomy, c-section, B hammer-toe repair, gastric bypass (needs revision), hernia repair, hip-surgery (labral repair), B knee surgery to regenerate meniscus, B oophorectomy, B shoulder surgeries for rotator cuff repair in 2012 and 2016, tonsillectomy, tubal ligation.  Pain update:  Location of pain: Lower abdomen/pelvis and vagina Current pain: 6/10  Max pain: 8/10 Least pain: 3/10 Nature of pain:"squeezing" achy with sharp jolts.  Location of pain:  R hip (LB) Current pain:  7/10 (2/10) Max pain:  7/10 (2/10) Least pain:  2/10 (2/10) Nature of pain: Sharp, dull, achy, shooting.  2/10 following treatment  Patient Goals: Wants to not have pain with ADL's.    OBJECTIVE  Changes in: Posture/Observations:  L PSIS deep/slightly high L lumbar/R thoracic scoliosis. Anterior pelvic tilt. L ankle low in supine  **Following treatment, L anterior rotatio remains.  Range of Motion/Flexibilty:    Strength/MMT:  LE MMT:  Pelvic floor:  Abdominal:   Palpation: TTP to L>R Psoas, adductor brevis, and pectineus  Gait Analysis:  INTERVENTIONS THIS SESSION: Manual: Performed TP release to L>R Psoas, adductor brevis, and pectineus to decrease spasm and pain and allow for improved balance of musculature and pelvic alignment for improved function and decreased symptoms.  Therex: educated on and practiced L anterior innominate rotation MET corrective exercise to improve pelvic alignment, decrease pressure on lumbosacral nerve roots, and decrease pain and spasm.  Total time: 60  min.                       Trigger Point Dry Needling - 04/28/20 0001    Consent Given? Yes    Education Handout Provided No    Muscles Treated Lower Quadrant Adductor longus/brevis/magnus    Muscles Treated Back/Hip Iliopsoas    Dry Needling Comments B    Other Dry Needling Pectineus on L    Adductor Response Twitch response elicited;Palpable increased muscle length    Iliopsoas Response Twitch response elicited;Palpable increased muscle length                  PT Short Term Goals - 04/17/20 1339      PT SHORT TERM GOAL #1   Title Patient will demonstrate improved pelvic alignment and balance of musculature surrounding the pelvis to facilitate decreased PFM spasms and decrease pelvic pain.    Baseline R anterior rotation and up-slip, L lumbar/R thoracic scoliosis. Obese, anterior pelvic tilt and spasms surrounding R>L hip    Time 5    Period Weeks    Status New    Target Date 05/22/20      PT SHORT TERM GOAL #2   Title Patient will report a reduction in pain to no greater than 6/10 over the prior week to demonstrate symptom improvement.    Baseline 10/10 max    Time 5    Period Weeks    Status New    Target Date 05/22/20      PT SHORT TERM GOAL #3   Title Patient will demonstrate HEP x1 in the clinic to demonstrate understanding and proper form to allow for further improvement.    Baseline Pt. lacks knowledge of which exercises will help decrease her Sx.    Time 5    Period Weeks    Status New    Target Date 05/22/20             PT Long Term Goals - 04/17/20 1344      PT LONG TERM GOAL #1   Title Patient will report no episodes of SUI/FI over the course of the prior two weeks to demonstrate improved functional ability.    Baseline UUI requiring # 1 or 2 incontinence pad (more when active). FI occurred ~ 3 times over 4 months at night.    Time 10    Period Weeks    Status New    Target Date 06/26/20      PT LONG TERM GOAL #2   Title  Patient will describe pain no greater than 2/10 during a full day of ADL's to demonstrate improved functional ability.    Baseline 10/10 max, 5/10 min.    Time 10    Period Weeks    Status New    Target Date 06/26/20      PT LONG TERM GOAL #3   Title Pt. Will demonstrate a reduction in FOTO score by 10 points in each category to demonstrate  improved functional ablility.    Baseline Pt. to fill out FOTO at visit #2    Time 10    Period Weeks    Status New    Target Date 06/26/20                 Plan - 04/28/20 1346    Clinical Impression Statement Pt. Responded well to all interventions today, demonstrating decreased spasm and pain from 6/10 to 2/10, as well as understanding and correct performance of all education and exercises provided today. They will continue to benefit from skilled physical therapy to work toward remaining goals and maximize function as well as decrease likelihood of symptom increase or recurrence.    PT Next Visit Plan TPDN/TP release, re-measure/assess for LLD functionally, teach breathing/deep core recruitmetn and coordination, urge suppression technique.    PT Home Exercise Plan bow-and-arrow, lumbar rotations, side-stretch, butterfly adductor stretch, MET correction for L anterior rotation.    Consulted and Agree with Plan of Care Patient           Patient will benefit from skilled therapeutic intervention in order to improve the following deficits and impairments:     Visit Diagnosis: Other idiopathic scoliosis, thoracolumbar region  Abnormal posture  Other muscle spasm  Other lack of coordination     Problem List Patient Active Problem List   Diagnosis Date Noted   Leg edema 06/22/2019   Essential hypertension 06/22/2019   Fatigue 06/22/2019   Willa Rough DPT, ATC Willa Rough 04/28/2020, 1:49 PM  [place] MAIN [facility] SERVICES [address] [place], [place], [address] Phone:  [phone]   Fax:  [phone]  Name: Kimberly Herring MRN: [id number] Date of Birth: 1966-03-08

## 2020-05-02 ENCOUNTER — Ambulatory Visit: Payer: BC Managed Care – PPO

## 2020-05-02 ENCOUNTER — Other Ambulatory Visit: Payer: Self-pay

## 2020-05-02 DIAGNOSIS — R278 Other lack of coordination: Secondary | ICD-10-CM

## 2020-05-02 DIAGNOSIS — M4125 Other idiopathic scoliosis, thoracolumbar region: Secondary | ICD-10-CM | POA: Diagnosis not present

## 2020-05-02 DIAGNOSIS — R293 Abnormal posture: Secondary | ICD-10-CM

## 2020-05-02 DIAGNOSIS — M62838 Other muscle spasm: Secondary | ICD-10-CM

## 2020-05-02 NOTE — Therapy (Signed)
[place] MAIN [facility] SERVICES [address] [place], [place], [address] Phone: [phone]   Fax:  [phone]  Physical Therapy Treatment  The patient has been informed of current processes in place at Outpatient Rehab to protect patients from Covid-19 exposure including social distancing, schedule modifications, and new cleaning procedures. After discussing their particular risk with a therapist based on the patient's personal risk factors, the patient has decided to proceed with in-person therapy.   Patient Details  Name: Kimberly Herring MRN: [id number] Date of Birth: 14-Mar-1966 No data recorded  Encounter Date: 05/02/2020   PT End of Session - 05/02/20 1151    Visit Number 4    Number of Visits 10    Date for PT Re-Evaluation 06/26/20    Authorization Type BCBS    Authorization Time Period 04/12/20 through 10/3 21    Authorization - Visit Number 4    Authorization - Number of Visits 10    Progress Note Due on Visit 10    PT Start Time 0930    PT Stop Time 1030    PT Time Calculation (min) 60 min    Activity Tolerance Patient tolerated treatment well;No increased pain    Behavior During Therapy WFL for tasks assessed/performed           Past Medical History:  Diagnosis Date  . Anxiety   . Arthritis   . Bipolar 2 disorder ([place])   . Depression   . Fibromyalgia   . Migraine aura, persistent     Past Surgical History:  Procedure Laterality Date  . ABDOMINAL HYSTERECTOMY    . CESAREAN SECTION    . ESOPHAGOGASTRODUODENOSCOPY    . FOOT SURGERY Bilateral   . GASTRIC BYPASS    . HERNIA REPAIR    . HIP SURGERY    . KNEE SURGERY Bilateral   . LAPAROSCOPIC OOPHERECTOMY    . SHOULDER SURGERY Bilateral   . TONSILLECTOMY    . TUBAL LIGATION      There were no vitals filed for this visit.  Pelvic Floor Physical Therapy Treatment Note  SCREENING  Changes in medications, allergies, or medical history?: no   SUBJECTIVE  Patient  reports: She has had increased stress from work and due to her relationship with her mom, this caused a migraine starting on Saturday thatr lasted all weekend so she did not "over-do-it" this weekend. Her knees are stiff and sore this morning because she has not been up long. H/A is 2-3/10 on the R side today.  Precautions:  Anxiety, Arthritis, Bipolar disorder, Depression, Fibromyalgia, Migraines,Had an abdominal hysterectomy, c-section, B hammer-toe repair, gastric bypass (needs revision), hernia repair, hip-surgery (labral repair), B knee surgery to regenerate meniscus, B oophorectomy, B shoulder surgeries for rotator cuff repair in 2012 and 2016, tonsillectomy, tubal ligation.  Pain update:  Location of pain: Lower abdomen/pelvis and vagina Current pain: 6/10  Max pain: 6/10 Least pain: 3/10 Nature of pain:"squeezing" achy with sharp jolts.  **2/10 following treatment "sore"   Location of pain:  R hip (LB) Current pain:  0/10 (0/10) Max pain:  0/10 (0/10) Least pain:  0/10 (0/10) Nature of pain: Sharp, dull, achy, shooting.   Patient Goals: Wants to not have pain with ADL's.    OBJECTIVE  Changes in: Posture/Observations:  L PSIS deep/slightly high L lumbar/R thoracic scoliosis. Anterior pelvic tilt. L ankle low in supine.**Following treatment, L anterior rotatio remains. (from prior session)  Range of Motion/Flexibilty:    Strength/MMT:  LE  MMT:   Pelvic Floor External Exam: Introitus Appears: elevated Skin integrity: WNL Palpation: TTP throughout all external PFM on L>R Cough: no motion Prolapse visible?: no Scar mobility: N/A  Internal Vaginal Exam: Strength (PERF): 2/5, 3 sec. 1 time (improved to 3+/5, 2 times following treatment) Symmetry: similar B. Palpation: TTP throughout Prolapse: none  Abdominal:   Palpation: TTP to R>L adductors. TTP to B mons  Gait Analysis:  INTERVENTIONS THIS SESSION: Manual: Assessed PFM and performed TP release to  all regions through a superficial layer B to decrease spasm and pain and allow for improved balance of musculature for improved function and decreased symptoms.  Self-care: educated on how to perform external PFM and posterior fourchette release to decrease spasm and pain and allow for improved balance of musculature for improved function and decreased symptoms.  Total time: 60 min.                               PT Short Term Goals - 04/17/20 1339      PT SHORT TERM GOAL #1   Title Patient will demonstrate improved pelvic alignment and balance of musculature surrounding the pelvis to facilitate decreased PFM spasms and decrease pelvic pain.    Baseline R anterior rotation and up-slip, L lumbar/R thoracic scoliosis. Obese, anterior pelvic tilt and spasms surrounding R>L hip    Time 5    Period Weeks    Status New    Target Date 05/22/20      PT SHORT TERM GOAL #2   Title Patient will report a reduction in pain to no greater than 6/10 over the prior week to demonstrate symptom improvement.    Baseline 10/10 max    Time 5    Period Weeks    Status New    Target Date 05/22/20      PT SHORT TERM GOAL #3   Title Patient will demonstrate HEP x1 in the clinic to demonstrate understanding and proper form to allow for further improvement.    Baseline Pt. lacks knowledge of which exercises will help decrease her Sx.    Time 5    Period Weeks    Status New    Target Date 05/22/20             PT Long Term Goals - 04/17/20 1344      PT LONG TERM GOAL #1   Title Patient will report no episodes of SUI/FI over the course of the prior two weeks to demonstrate improved functional ability.    Baseline UUI requiring # 1 or 2 incontinence pad (more when active). FI occurred ~ 3 times over 4 months at night.    Time 10    Period Weeks    Status New    Target Date 06/26/20      PT LONG TERM GOAL #2   Title Patient will describe pain no greater than 2/10 during a  full day of ADL's to demonstrate improved functional ability.    Baseline 10/10 max, 5/10 min.    Time 10    Period Weeks    Status New    Target Date 06/26/20      PT LONG TERM GOAL #3   Title Pt. Will demonstrate a reduction in FOTO score by 10 points in each category to demonstrate improved functional ablility.    Baseline Pt. to fill out FOTO at visit #2    Time 10  Period Weeks    Status New    Target Date 06/26/20                 Plan - 05/02/20 1152    Clinical Impression Statement Pt. Responded well to all interventions today, demonstrating decreased spasm and pain from 6/10 to 2/10 as well as understanding and correct performance of all education and exercises provided today. They will continue to benefit from skilled physical therapy to work toward remaining goals and maximize function as well as decrease likelihood of symptom increase or recurrence.    PT Next Visit Plan Further internal release B to deeper layers. TPDN/TP release, re-measure/assess for LLD functionally, teach breathing/deep core recruitmetn and coordination, urge suppression technique.    PT Home Exercise Plan bow-and-arrow, lumbar rotations, side-stretch, butterfly adductor stretch, MET correction for L anterior rotation, self TP release to B external PFM and posterior fourchette.    Consulted and Agree with Plan of Care Patient           Patient will benefit from skilled therapeutic intervention in order to improve the following deficits and impairments:     Visit Diagnosis: Other idiopathic scoliosis, thoracolumbar region  Abnormal posture  Other muscle spasm  Other lack of coordination     Problem List Patient Active Problem List   Diagnosis Date Noted  . Leg edema 06/22/2019  . Essential hypertension 06/22/2019  . Fatigue 06/22/2019   Willa Rough DPT, ATC Willa Rough 05/02/2020, 11:58 AM  [place] MAIN [facility] SERVICES [address] [place], [place], [address] Phone: [phone]   Fax:  [phone]  Name: Kimberly Herring MRN: [id number] Date of Birth: 26-Aug-1966

## 2020-05-02 NOTE — Patient Instructions (Signed)
Self External Trigger Point Relief    1) Wash your hands and prop yourself up in a way where you can easily reach the vagina. You may wish to have a small hand-held mirror near by.  2) Use the 2 middle fingers to put gentle pressure on the three external pelvic floor muscles and hold pressure and take deep breaths as you allow the tension to release and discomfort to dissipate   3) Repeat the process for any trigger points you find spending between 3-10 minutes on this every 1-2 days until you do not find any more trigger points or you are told otherwise by your therapist.  Self Posterior Fourchette Stretching/Mobilization    1) Wash your hands and prop your body up so you can easily reach the vagina, bring hand-held mirror if desired.  2) Apply lubricant to the thumb and vaginal opening  3) Place thumb ~ 1/2 an inch into the vagina with the pad of the thumb pointed down and apply gentle pressure to the posterior fourchette.  4) Gently maintain pressure down toward the anus, or off at a diagonal slightly. Make sure the pressure is not so great that your muscles tighten up and guard, just enough to create slight discomfort.  Do this for ~ 3 min. Per night to decrease tightness and tenderness at the vaginal opening.

## 2020-05-09 ENCOUNTER — Ambulatory Visit: Payer: BC Managed Care – PPO

## 2020-05-09 ENCOUNTER — Other Ambulatory Visit: Payer: Self-pay

## 2020-05-09 DIAGNOSIS — R293 Abnormal posture: Secondary | ICD-10-CM

## 2020-05-09 DIAGNOSIS — M62838 Other muscle spasm: Secondary | ICD-10-CM

## 2020-05-09 DIAGNOSIS — M4125 Other idiopathic scoliosis, thoracolumbar region: Secondary | ICD-10-CM

## 2020-05-09 DIAGNOSIS — R278 Other lack of coordination: Secondary | ICD-10-CM

## 2020-05-09 NOTE — Therapy (Signed)
[place] MAIN [facility] SERVICES [address] [place], [place], [address] Phone: [phone]   Fax:  [phone]  Physical Therapy Treatment  The patient has been informed of current processes in place at Outpatient Rehab to protect patients from Covid-19 exposure including social distancing, schedule modifications, and new cleaning procedures. After discussing their particular risk with a therapist based on the patient's personal risk factors, the patient has decided to proceed with in-person therapy.  Patient Details  Name: Kimberly Herring MRN: [id number] Date of Birth: [birth year] No data recorded  Encounter Date: 05/09/2020   PT End of Session - 05/09/20 1208    Visit Number 5    Number of Visits 10    Date for PT Re-Evaluation 06/26/20    Authorization Type BCBS    Authorization Time Period 04/12/20 through 10/3 21    Authorization - Visit Number 5    Authorization - Number of Visits 10    Progress Note Due on Visit 10    PT Start Time 0943    PT Stop Time 1030    PT Time Calculation (min) 47 min    Activity Tolerance Patient tolerated treatment well;No increased pain    Behavior During Therapy WFL for tasks assessed/performed           Past Medical History:  Diagnosis Date  . Anxiety   . Arthritis   . Bipolar 2 disorder ([place])   . Depression   . Fibromyalgia   . Migraine aura, persistent     Past Surgical History:  Procedure Laterality Date  . ABDOMINAL HYSTERECTOMY    . CESAREAN SECTION    . ESOPHAGOGASTRODUODENOSCOPY    . FOOT SURGERY Bilateral   . GASTRIC BYPASS    . HERNIA REPAIR    . HIP SURGERY    . KNEE SURGERY Bilateral   . LAPAROSCOPIC OOPHERECTOMY    . SHOULDER SURGERY Bilateral   . TONSILLECTOMY    . TUBAL LIGATION      There were no vitals filed for this visit.  Pelvic Floor Physical Therapy Treatment Note  SCREENING  Changes in medications, allergies, or medical history?: no   SUBJECTIVE  Patient  reports: She has had some neck and back pain. Has had higher stress levels this week as her mother is in the hospital. She was able to get pain to go down in her back when she did her stretches. Needed tylenol for her neck and HA. Vaginal pain is  Worst when she sits or stands for a long time. Feels like she needs to move to make the legs feel better but then the pelvic pain is worse. Relief lasted ~ 3 days following last session. Was able to reach the external muscles better than the posterior fourchette so the deeper muscles are worse off.   Precautions:  Anxiety, Arthritis, Bipolar disorder, Depression, Fibromyalgia, Migraines,Had an abdominal hysterectomy, c-section, B hammer-toe repair, gastric bypass (needs revision), hernia repair, hip-surgery (labral repair), B knee surgery to regenerate meniscus, B oophorectomy, B shoulder surgeries for rotator cuff repair in 2012 and 2016, tonsillectomy, tubal ligation.  Pain update:  Location of pain: Lower abdomen/pelvis and vagina Current pain: 6/10 (worst deeper) Max pain: 7/10 (with stress) Least pain: 2/10 Nature of pain:"squeezing" achy with sharp jolts.  **2/10 following treatment "sore"   Location of pain:  R hip (LB) Current pain:  0/10 (0/10) Max pain:  0/10 (0/10) Least pain:  0/10 (0/10) Nature of pain: Sharp, dull, achy, shooting.  Patient Goals: Wants to not have pain with ADL's.    OBJECTIVE  Changes in: Posture/Observations:  L PSIS deep/slightly high L lumbar/R thoracic scoliosis. Anterior pelvic tilt. L ankle low in supine.**Following treatment, L anterior rotatio remains. (from prior session)  Range of Motion/Flexibilty:    Strength/MMT:  LE MMT:   Pelvic Floor External Exam: (initial assessment 05/02/20) Introitus Appears: elevated Skin integrity: WNL Palpation: TTP throughout all external PFM on L>R Cough: no motion Prolapse visible?: no Scar mobility: N/A  Internal Vaginal Exam: Strength (PERF):  2/5, 3 sec. 1 time (improved to 3+/5, 2 times following treatment) Symmetry: similar B. Palpation: TTP throughout Prolapse: none  TODAY: TTP to B posterior PR/PC and posterior fourchette.  Abdominal:   Palpation: TTP to R>L adductors. TTP to B mons (from prior session)  Gait Analysis:  INTERVENTIONS THIS SESSION: Manual: Performed TP release to the posterior fourchette and to posterior PR/PC B to decrease spasm and pain and allow for improved balance of musculature for improved function and decreased symptoms.  Theract: educated on and performed progressive relaxation technique to give pt. A tool to help mediate increased muscular tension with increased stress levels around her mother's hospitalization.  Total time: 47 min.                              PT Short Term Goals - 04/17/20 1339      PT SHORT TERM GOAL #1   Title Patient will demonstrate improved pelvic alignment and balance of musculature surrounding the pelvis to facilitate decreased PFM spasms and decrease pelvic pain.    Baseline R anterior rotation and up-slip, L lumbar/R thoracic scoliosis. Obese, anterior pelvic tilt and spasms surrounding R>L hip    Time 5    Period Weeks    Status New    Target Date 05/22/20      PT SHORT TERM GOAL #2   Title Patient will report a reduction in pain to no greater than 6/10 over the prior week to demonstrate symptom improvement.    Baseline 10/10 max    Time 5    Period Weeks    Status New    Target Date 05/22/20      PT SHORT TERM GOAL #3   Title Patient will demonstrate HEP x1 in the clinic to demonstrate understanding and proper form to allow for further improvement.    Baseline Pt. lacks knowledge of which exercises will help decrease her Sx.    Time 5    Period Weeks    Status New    Target Date 05/22/20             PT Long Term Goals - 04/17/20 1344      PT LONG TERM GOAL #1   Title Patient will report no episodes of SUI/FI over  the course of the prior two weeks to demonstrate improved functional ability.    Baseline UUI requiring # 1 or 2 incontinence pad (more when active). FI occurred ~ 3 times over 4 months at night.    Time 10    Period Weeks    Status New    Target Date 06/26/20      PT LONG TERM GOAL #2   Title Patient will describe pain no greater than 2/10 during a full day of ADL's to demonstrate improved functional ability.    Baseline 10/10 max, 5/10 min.    Time 10    Period Weeks  Status New    Target Date 06/26/20      PT LONG TERM GOAL #3   Title Pt. Will demonstrate a reduction in FOTO score by 10 points in each category to demonstrate improved functional ablility.    Baseline Pt. to fill out FOTO at visit #2    Time 10    Period Weeks    Status New    Target Date 06/26/20                 Plan - 05/09/20 1208    Clinical Impression Statement Pt. Responded well to all interventions today, demonstrating improved pain from 6/10 to 2/10 as well as understanding and correct performance of all education and exercises provided today. They will continue to benefit from skilled physical therapy to work toward remaining goals and maximize function as well as decrease likelihood of symptom increase or recurrence.    PT Next Visit Plan Further internal release B to anterior and lateral deeper layers. TPDN/TP release, re-measure/assess for LLD functionally, teach breathing/deep core recruitmetn and coordination, urge suppression technique.    PT Home Exercise Plan bow-and-arrow, lumbar rotations, side-stretch, butterfly adductor stretch, MET correction for L anterior rotation, self TP release to B external PFM and posterior fourchette.    Consulted and Agree with Plan of Care Patient           Patient will benefit from skilled therapeutic intervention in order to improve the following deficits and impairments:     Visit Diagnosis: Other idiopathic scoliosis, thoracolumbar region  Abnormal  posture  Other muscle spasm  Other lack of coordination     Problem List Patient Active Problem List   Diagnosis Date Noted  . Leg edema 06/22/2019  . Essential hypertension 06/22/2019  . Fatigue 06/22/2019   Willa Rough DPT, ATC Willa Rough 05/09/2020, 12:13 PM  [place] MAIN [facility] SERVICES [address] [place], [place], [address] Phone: [phone]   Fax:  [phone]  Name: Kimberly Herring MRN: [id number] Date of Birth: 09-Apr-1966

## 2020-05-09 NOTE — Patient Instructions (Signed)
Progressive relaxation technique:  Start by tensing all the muscles in your right foot, then calf, then upper leg, glute, low back and hold for 2 deep breaths, release the tension and let the limb melt into the table. Next start with the left leg and do the same progression, foot, lower leg, upper leg, glute, low back, then release. Then the Right hand make a fist, tense the forearm, the upper arm, shoulder, hold for two breaths, and release letting it melt into the ground, repeat on the left. Finally, start with both feet, both calves, both upper legs, both glutes, lower abdomen, upper abdomen, make fists, tighten forearms, upper arms, shoulders, face. Hold for two breaths and then completely melt into the floor.     

## 2020-05-16 ENCOUNTER — Other Ambulatory Visit: Payer: Self-pay

## 2020-05-16 ENCOUNTER — Ambulatory Visit: Payer: BC Managed Care – PPO

## 2020-05-16 DIAGNOSIS — M4125 Other idiopathic scoliosis, thoracolumbar region: Secondary | ICD-10-CM

## 2020-05-16 DIAGNOSIS — R293 Abnormal posture: Secondary | ICD-10-CM

## 2020-05-16 DIAGNOSIS — M62838 Other muscle spasm: Secondary | ICD-10-CM

## 2020-05-16 DIAGNOSIS — R278 Other lack of coordination: Secondary | ICD-10-CM

## 2020-05-16 NOTE — Therapy (Signed)
[place] MAIN [facility] SERVICES [address] [place], [place], [address] Phone: [phone]   Fax:  [phone]  Physical Therapy Treatment  The patient has been informed of current processes in place at Outpatient Rehab to protect patients from Covid-19 exposure including social distancing, schedule modifications, and new cleaning procedures. After discussing their particular risk with a therapist based on the patient's personal risk factors, the patient has decided to proceed with in-person therapy.  Patient Details  Name: Kimberly Herring MRN: [id number] Date of Birth: Oct 11, 1965 No data recorded  Encounter Date: 05/16/2020   PT End of Session - 05/16/20 1216    Visit Number 6    Number of Visits 10    Date for PT Re-Evaluation 06/26/20    Authorization Type BCBS    Authorization Time Period 04/12/20 through 10/3 21    Authorization - Visit Number 6    Authorization - Number of Visits 10    Progress Note Due on Visit 10    PT Start Time 0935    PT Stop Time 7628    PT Time Calculation (min) 60 min    Activity Tolerance Patient tolerated treatment well;No increased pain    Behavior During Therapy WFL for tasks assessed/performed           Past Medical History:  Diagnosis Date  . Anxiety   . Arthritis   . Bipolar 2 disorder ([place])   . Depression   . Fibromyalgia   . Migraine aura, persistent     Past Surgical History:  Procedure Laterality Date  . ABDOMINAL HYSTERECTOMY    . CESAREAN SECTION    . ESOPHAGOGASTRODUODENOSCOPY    . FOOT SURGERY Bilateral   . GASTRIC BYPASS    . HERNIA REPAIR    . HIP SURGERY    . KNEE SURGERY Bilateral   . LAPAROSCOPIC OOPHERECTOMY    . SHOULDER SURGERY Bilateral   . TONSILLECTOMY    . TUBAL LIGATION      There were no vitals filed for this visit.   Pelvic Floor Physical Therapy Treatment Note  SCREENING  Changes in medications, allergies, or medical history?: She is going to start  injections for weight loss.   SUBJECTIVE  Patient reports: She was doing really well until yesterday when she carried a case of water to help her mom who has been at home but needs constant supervision. Now her back is really hurting.  Precautions:  Anxiety, Arthritis, Bipolar disorder, Depression, Fibromyalgia, Migraines,Had an abdominal hysterectomy, c-section, B hammer-toe repair, gastric bypass (needs revision), hernia repair, hip-surgery (labral repair), B knee surgery to regenerate meniscus, B oophorectomy, B shoulder surgeries for rotator cuff repair in 2012 and 2016, tonsillectomy, tubal ligation.  Pain update:  Location of pain: Lower abdomen/pelvis and vagina Current pain: 4/10 (worst deeper) Max pain: 6/10 (with stress) Least pain: 2/10 Nature of pain:"squeezing" achy with sharp jolts.  **0/10 following treatment  Location of pain:  R hip (LB) Current pain:  0/10 (7/10) Max pain:  0/10 (8/10) Least pain:  0/10 (0/10) Nature of pain: Sharp, dull, achy, shooting.  **0/10 following treatment.  Patient Goals: Wants to not have pain with ADL's.    OBJECTIVE  Changes in: Posture/Observations:  L PSIS deep/slightly high L lumbar/R thoracic scoliosis. Anterior pelvic tilt. L ankle low in supine.**Following treatment, L anterior rotatio remains. (from prior session)   Range of Motion/Flexibilty:    Strength/MMT:  LE MMT:   Pelvic Floor External Exam: (initial assessment 05/02/20)  Introitus Appears: elevated Skin integrity: WNL Palpation: TTP throughout all external PFM on L>R Cough: no motion Prolapse visible?: no Scar mobility: N/A  Internal Vaginal Exam: Strength (PERF): 2/5, 3 sec. 1 time (improved to 3+/5, 2 times following treatment) Symmetry: similar B. Palpation: TTP throughout Prolapse: none  TODAY: TTP to B posterior PR/PC and posterior fourchette.  Abdominal:  Pt. Has difficulty recruiting glutes, TA, and obliques for posterior pelvic tilt  in hook-lying.  ~ 30% improved following treatment.  Palpation: TTP to R>L adductors. TTP to B mons (from prior session)  TODAY:TTP to B Diaphragm, Glute Med and Min, lumbar erector spinae and multifidus near ~ L4-5.  Gait Analysis:  INTERVENTIONS THIS SESSION: Manual: Performed TP release and STM to B Diaphragm, Glute Med and Min, lumbar erector spinae and multifidus near ~ L4-5 to decrease spasm and pain and allow for improved balance of musculature for improved function and decreased symptoms.  Dry-needle: Performed TPDN with a .30x160m needle and standard approach as described below to decrease spasm and pain and allow for improved balance of musculature for improved function and decreased symptoms.  Therex: educated on and used posterior pelvic tilt in hook-lying as a test-re-test to determine improvement in deep-core recruitment following manual and TPDN. Required MAX TC, VC to achieve correct coordination and timing/sequencing of muscles. Will require review.  Total time: 47 min.                      Trigger Point Dry Needling - 05/16/20 0001    Consent Given? Yes    Education Handout Provided No    Muscles Treated Back/Hip Gluteus minimus;Gluteus medius;Erector spinae;Lumbar multifidi    Gluteus Minimus Response Twitch response elicited;Palpable increased muscle length    Gluteus Medius Response Twitch response elicited;Palpable increased muscle length    Erector spinae Response Twitch response elicited;Palpable increased muscle length    Lumbar multifidi Response Twitch response elicited;Palpable increased muscle length                  PT Short Term Goals - 04/17/20 1339      PT SHORT TERM GOAL #1   Title Patient will demonstrate improved pelvic alignment and balance of musculature surrounding the pelvis to facilitate decreased PFM spasms and decrease pelvic pain.    Baseline R anterior rotation and up-slip, L lumbar/R thoracic scoliosis.  Obese, anterior pelvic tilt and spasms surrounding R>L hip    Time 5    Period Weeks    Status New    Target Date 05/22/20      PT SHORT TERM GOAL #2   Title Patient will report a reduction in pain to no greater than 6/10 over the prior week to demonstrate symptom improvement.    Baseline 10/10 max    Time 5    Period Weeks    Status New    Target Date 05/22/20      PT SHORT TERM GOAL #3   Title Patient will demonstrate HEP x1 in the clinic to demonstrate understanding and proper form to allow for further improvement.    Baseline Pt. lacks knowledge of which exercises will help decrease her Sx.    Time 5    Period Weeks    Status New    Target Date 05/22/20             PT Long Term Goals - 04/17/20 1344      PT LONG TERM GOAL #1   Title Patient will  report no episodes of SUI/FI over the course of the prior two weeks to demonstrate improved functional ability.    Baseline UUI requiring # 1 or 2 incontinence pad (more when active). FI occurred ~ 3 times over 4 months at night.    Time 10    Period Weeks    Status New    Target Date 06/26/20      PT LONG TERM GOAL #2   Title Patient will describe pain no greater than 2/10 during a full day of ADL's to demonstrate improved functional ability.    Baseline 10/10 max, 5/10 min.    Time 10    Period Weeks    Status New    Target Date 06/26/20      PT LONG TERM GOAL #3   Title Pt. Will demonstrate a reduction in FOTO score by 10 points in each category to demonstrate improved functional ablility.    Baseline Pt. to fill out FOTO at visit #2    Time 10    Period Weeks    Status New    Target Date 06/26/20                 Plan - 05/16/20 1217    Clinical Impression Statement Pt. Responded well to all interventions today, demonstrating decreased spasm and pain, improved recruitment and coordination of deep-core musculature, as well as understanding and correct performance of all education and exercises provided  today. They will continue to benefit from skilled physical therapy to work toward remaining goals and maximize function as well as decrease likelihood of symptom increase or recurrence.    PT Next Visit Plan Review and practice/progress posterior pelvic tilts in hook-lying. Further internal release B to anterior and lateral deeper layers. TPDN/TP release, re-measure/assess for LLD functionally, teach breathing/deep core recruitmetn and coordination, urge suppression technique.    PT Home Exercise Plan bow-and-arrow, lumbar rotations, side-stretch, butterfly adductor stretch, MET correction for L anterior rotation, self TP release to B external PFM and posterior fourchette, posterior pelvic tilts in hook-lying.    Consulted and Agree with Plan of Care Patient           Patient will benefit from skilled therapeutic intervention in order to improve the following deficits and impairments:     Visit Diagnosis: Other idiopathic scoliosis, thoracolumbar region  Abnormal posture  Other muscle spasm  Other lack of coordination     Problem List Patient Active Problem List   Diagnosis Date Noted  . Leg edema 06/22/2019  . Essential hypertension 06/22/2019  . Fatigue 06/22/2019   Willa Rough DPT, ATC Willa Rough 05/16/2020, 12:20 PM  [place] MAIN [facility] SERVICES [address] [place], [place], [address] Phone: [phone]   Fax:  [phone]  Name: Kimberly Herring MRN: [id number] Date of Birth: September 22, 1966

## 2020-05-16 NOTE — Patient Instructions (Signed)
Pelvic Tilt With Pelvic Floor (Hook-Lying)        Lie with hips and knees bent. Exhale and draw the low tummy in, followed by the glutes and finally the upper abs while you flatten the low back so that pelvis tilts. Do 2x15, once per day.

## 2020-05-23 ENCOUNTER — Other Ambulatory Visit: Payer: Self-pay

## 2020-05-23 ENCOUNTER — Ambulatory Visit: Payer: BC Managed Care – PPO

## 2020-05-23 DIAGNOSIS — R278 Other lack of coordination: Secondary | ICD-10-CM

## 2020-05-23 DIAGNOSIS — R293 Abnormal posture: Secondary | ICD-10-CM

## 2020-05-23 DIAGNOSIS — M4125 Other idiopathic scoliosis, thoracolumbar region: Secondary | ICD-10-CM

## 2020-05-23 DIAGNOSIS — M62838 Other muscle spasm: Secondary | ICD-10-CM

## 2020-05-23 NOTE — Therapy (Signed)
[place] MAIN [facility] SERVICES [address] [place], [place], [address] Phone: [phone]   Fax:  [phone]  Physical Therapy Treatment  The patient has been informed of current processes in place at Outpatient Rehab to protect patients from Covid-19 exposure including social distancing, schedule modifications, and new cleaning procedures. After discussing their particular risk with a therapist based on the patient's personal risk factors, the patient has decided to proceed with in-person therapy.  Patient Details  Name: Kimberly Herring MRN: [id number] Date of Birth: 04/04/1966 No data recorded  Encounter Date: 05/23/2020   PT End of Session - 05/23/20 1053    Visit Number 7    Number of Visits 10    Date for PT Re-Evaluation 06/26/20    Authorization Type BCBS    Authorization Time Period 04/12/20 through 10/3 21    Authorization - Visit Number 7    Authorization - Number of Visits 10    Progress Note Due on Visit 10    PT Start Time 0830    PT Stop Time 0930    PT Time Calculation (min) 60 min    Activity Tolerance Patient tolerated treatment well;No increased pain    Behavior During Therapy WFL for tasks assessed/performed           Past Medical History:  Diagnosis Date  . Anxiety   . Arthritis   . Bipolar 2 disorder ([place])   . Depression   . Fibromyalgia   . Migraine aura, persistent     Past Surgical History:  Procedure Laterality Date  . ABDOMINAL HYSTERECTOMY    . CESAREAN SECTION    . ESOPHAGOGASTRODUODENOSCOPY    . FOOT SURGERY Bilateral   . GASTRIC BYPASS    . HERNIA REPAIR    . HIP SURGERY    . KNEE SURGERY Bilateral   . LAPAROSCOPIC OOPHERECTOMY    . SHOULDER SURGERY Bilateral   . TONSILLECTOMY    . TUBAL LIGATION      There were no vitals filed for this visit.   Pelvic Floor Physical Therapy Treatment Note  SCREENING  Changes in medications, allergies, or medical history?: She is going to start  injections for weight loss.   SUBJECTIVE  Patient reports: She had a procedure done (nerve block) to both knees on Wednesday last week. It helped but is starting to wear off already. Her pain is worst when she is sitting. It took about 1 day for the pain to come back after last visit, feels like something is throbbing and wants to push out from the inside. Had to stop her HEP for 2 days but has re-started.   Precautions:  Anxiety, Arthritis, Bipolar disorder, Depression, Fibromyalgia, Migraines,Had an abdominal hysterectomy, c-section, B hammer-toe repair, gastric bypass (needs revision), hernia repair, hip-surgery (labral repair), B knee surgery to regenerate meniscus, B oophorectomy, B shoulder surgeries for rotator cuff repair in 2012 and 2016, tonsillectomy, tubal ligation.  Pain update:  Location of pain: Lower abdomen/pelvis and vagina Current pain: 6/10 (worst deeper) Max pain: 8/10 (with stress) Least pain: 2/10 Nature of pain:"squeezing" achy with sharp jolts.  **4/10 "achy not pain" following treatment  Location of pain:  R hip (LB) Current pain:  0/10 (3/10) Max pain:  0/10 (6/10) Least pain:  0/10 (0/10) Nature of pain: Sharp, dull, achy, shooting.  **2/10 in LB following treatment.  Patient Goals: Wants to not have pain with ADL's.    OBJECTIVE  Changes in: Posture/Observations:  L PSIS deep/slightly  high L lumbar/R thoracic scoliosis. Anterior pelvic tilt. L ankle low in supine.**Following treatment, L anterior rotatio remains. (from prior session)   Range of Motion/Flexibilty:    Strength/MMT:  LE MMT:   Pelvic Floor External Exam: (initial assessment 05/02/20) Introitus Appears: elevated Skin integrity: WNL Palpation: TTP throughout all external PFM on L>R Cough: no motion Prolapse visible?: no Scar mobility: N/A  Internal Vaginal Exam: Strength (PERF): 2/5, 3 sec. 1 time (improved to 3+/5, 2 times following treatment) Symmetry: similar  B. Palpation: TTP throughout Prolapse: none  TODAY: TTP to all PFM internally B. Strength 3/5 following release and able to coordinate squeeze, release and bulge with slow/delayed relaxation and bulge.  Abdominal:  Pt. Has difficulty recruiting glutes, TA, and obliques for posterior pelvic tilt in hook-lying.  ~ 30% improved following treatment.  Palpation: TTP to R>L adductors. TTP to B mons (from prior session)  Gait Analysis:  INTERVENTIONS THIS SESSION: Manual: Performed TP release to all internal PFM regions other than the posterior fourchette to decrease spasm and pain and allow for improved balance of musculature for improved function and decreased symptoms.  Therex:Educated on and given handout for happy-baby stretch to help decrease PFM resting tension and educated on how to do kegle, release, and gentle bulge to help Pt. begin to improve coordination of the PFM for decreased pain and POP Sx.  Total time: 60 min.                             PT Short Term Goals - 04/17/20 1339      PT SHORT TERM GOAL #1   Title Patient will demonstrate improved pelvic alignment and balance of musculature surrounding the pelvis to facilitate decreased PFM spasms and decrease pelvic pain.    Baseline R anterior rotation and up-slip, L lumbar/R thoracic scoliosis. Obese, anterior pelvic tilt and spasms surrounding R>L hip    Time 5    Period Weeks    Status New    Target Date 05/22/20      PT SHORT TERM GOAL #2   Title Patient will report a reduction in pain to no greater than 6/10 over the prior week to demonstrate symptom improvement.    Baseline 10/10 max    Time 5    Period Weeks    Status New    Target Date 05/22/20      PT SHORT TERM GOAL #3   Title Patient will demonstrate HEP x1 in the clinic to demonstrate understanding and proper form to allow for further improvement.    Baseline Pt. lacks knowledge of which exercises will help decrease her Sx.     Time 5    Period Weeks    Status New    Target Date 05/22/20             PT Long Term Goals - 04/17/20 1344      PT LONG TERM GOAL #1   Title Patient will report no episodes of SUI/FI over the course of the prior two weeks to demonstrate improved functional ability.    Baseline UUI requiring # 1 or 2 incontinence pad (more when active). FI occurred ~ 3 times over 4 months at night.    Time 10    Period Weeks    Status New    Target Date 06/26/20      PT LONG TERM GOAL #2   Title Patient will describe pain no greater than 2/10  during a full day of ADL's to demonstrate improved functional ability.    Baseline 10/10 max, 5/10 min.    Time 10    Period Weeks    Status New    Target Date 06/26/20      PT LONG TERM GOAL #3   Title Pt. Will demonstrate a reduction in FOTO score by 10 points in each category to demonstrate improved functional ablility.    Baseline Pt. to fill out FOTO at visit #2    Time 10    Period Weeks    Status New    Target Date 06/26/20                 Plan - 05/23/20 1053    Clinical Impression Statement Pt. Responded well to all interventions today, demonstrating decreased PFM spasm and pain, improved PFM recruitment and coordination, as well as understanding and correct performance of all education and exercises provided today. They will continue to benefit from skilled physical therapy to work toward remaining goals and maximize function as well as decrease likelihood of symptom increase or recurrence.    PT Next Visit Plan teach hip-flexor stretch, Review and practice/progress posterior pelvic tilts in hook-lying. TPDN to B adductors and cupping to B thighs. TPDN/TP release, re-measure/assess for LLD functionally, teach urge suppression technique.    PT Home Exercise Plan bow-and-arrow, lumbar rotations, side-stretch, butterfly adductor stretch, MET correction for L anterior rotation, self TP release to B external PFM and posterior fourchette,  posterior pelvic tilts in hook-lying, frog stretch.    Consulted and Agree with Plan of Care Patient           Patient will benefit from skilled therapeutic intervention in order to improve the following deficits and impairments:     Visit Diagnosis: Other idiopathic scoliosis, thoracolumbar region  Abnormal posture  Other muscle spasm  Other lack of coordination     Problem List Patient Active Problem List   Diagnosis Date Noted  . Leg edema 06/22/2019  . Essential hypertension 06/22/2019  . Fatigue 06/22/2019   Willa Rough DPT, ATC Willa Rough 05/23/2020, 11:01 AM  [place] MAIN [facility] SERVICES [address] [place], [place], [address] Phone: [phone]   Fax:  [phone]  Name: Kimberly Herring MRN: [id number] Date of Birth: [birth year]

## 2020-05-23 NOTE — Patient Instructions (Signed)
  Hold for 5 deep breaths, repeat 2-3 times, once daily.

## 2020-05-31 ENCOUNTER — Ambulatory Visit: Payer: BC Managed Care – PPO | Attending: Obstetrics and Gynecology

## 2020-05-31 ENCOUNTER — Other Ambulatory Visit: Payer: Self-pay

## 2020-05-31 DIAGNOSIS — M4125 Other idiopathic scoliosis, thoracolumbar region: Secondary | ICD-10-CM

## 2020-05-31 DIAGNOSIS — R293 Abnormal posture: Secondary | ICD-10-CM | POA: Diagnosis present

## 2020-05-31 DIAGNOSIS — M62838 Other muscle spasm: Secondary | ICD-10-CM | POA: Diagnosis present

## 2020-05-31 DIAGNOSIS — R278 Other lack of coordination: Secondary | ICD-10-CM | POA: Diagnosis present

## 2020-05-31 NOTE — Patient Instructions (Signed)
° °  Sit your bottom at the edge of the bed, pull your knee into your chest and keep it close as you lie back, feel the stretch down the front of the leg that is down.   Hold-relax: Gently lift the knee of the down leg up ~ 1/2 and inch and hold for 5 seconds, then let it relax all the way for a second and repeat 4 more times to decrease resting tension in the muscle.   Stretch: Let the leg relax and feel a stretch across the front of the hip/thigh as you take belly breaths. Hold for __5__ deep belly breaths. Relax. Repeat __2-3__ times per side.    Do this __1-2__ times per day.

## 2020-05-31 NOTE — Therapy (Signed)
[place] MAIN [facility] SERVICES [address] [place], [place], [address] Phone: [phone]   Fax:  [phone]  Physical Therapy Treatment  The patient has been informed of current processes in place at Outpatient Rehab to protect patients from Covid-19 exposure including social distancing, schedule modifications, and new cleaning procedures. After discussing their particular risk with a therapist based on the patient's personal risk factors, the patient has decided to proceed with in-person therapy.   Patient Details  Name: Kimberly Herring MRN: [id number] Date of Birth: March 24, 1966 No data recorded  Encounter Date: 05/31/2020   PT End of Session - 05/31/20 1500    Visit Number 8    Number of Visits 10    Date for PT Re-Evaluation 06/26/20    Authorization Type BCBS    Authorization Time Period 04/12/20 through 10/3 21    Authorization - Visit Number 8    Authorization - Number of Visits 10    Progress Note Due on Visit 10    PT Start Time 0940    PT Stop Time 1033    PT Time Calculation (min) 53 min    Activity Tolerance Patient tolerated treatment well;No increased pain    Behavior During Therapy WFL for tasks assessed/performed           Past Medical History:  Diagnosis Date   Anxiety    Arthritis    Bipolar 2 disorder ([place])    Depression    Fibromyalgia    Migraine aura, persistent     Past Surgical History:  Procedure Laterality Date   ABDOMINAL HYSTERECTOMY     CESAREAN SECTION     ESOPHAGOGASTRODUODENOSCOPY     FOOT SURGERY Bilateral    GASTRIC BYPASS     HERNIA REPAIR     HIP SURGERY     KNEE SURGERY Bilateral    LAPAROSCOPIC OOPHERECTOMY     SHOULDER SURGERY Bilateral    TONSILLECTOMY     TUBAL LIGATION      There were no vitals filed for this visit.  Pelvic Floor Physical Therapy Treatment Note  SCREENING  Changes in medications, allergies, or medical history?: no   SUBJECTIVE  Patient  reports: She has been staying with her mom in [place] and helping her, has not been able to do a lot of her exercises because she has been exhausted from helping her. Has not been doing her exercises this week.   Precautions:  Anxiety, Arthritis, Bipolar disorder, Depression, Fibromyalgia, Migraines,Had an abdominal hysterectomy, c-section, B hammer-toe repair, gastric bypass (needs revision), hernia repair, hip-surgery (labral repair), B knee surgery to regenerate meniscus, B oophorectomy, B shoulder surgeries for rotator cuff repair in 2012 and 2016, tonsillectomy, tubal ligation.  Pain update:  Location of pain: Lower abdomen/pelvis and vagina on L Current pain: 5/10 (worst deeper) Max pain: 6/10 (with stress) Least pain: 3/10 Nature of pain:"squeezing" achy with sharp jolts.  **2/10 "deep vaginal pain" following treatment  Location of pain:  L hip (LB) Current pain:  6/10 (6/10) Max pain:  6/10 (6/10) Least pain:  0/10 (0/10) Nature of pain: Sharp, dull, achy, shooting.  **0/10 in LB following treatment. "Just a little in her knees"   Patient Goals: Wants to not have pain with ADL's.    OBJECTIVE  Changes in: Posture/Observations:  L PSIS deep/slightly high L lumbar/R thoracic scoliosis. Anterior pelvic tilt. L ankle low in supine.**Following treatment, L anterior rotatio remains. (from prior session) Standing: LLE bent > RLE and L PSIS  high Supine: LLE long  Long-sit: LLE long but RLE bent> LLE Prone: RLE long   Range of Motion/Flexibilty:  Decreased fascial mobility through B posterior hips, thighs, and low back. Decreased sacral mobility and TTP at L>R sacral border.   Strength/MMT:  LE MMT:   Pelvic Floor External Exam: (initial assessment 05/02/20) Introitus Appears: elevated Skin integrity: WNL Palpation: TTP throughout all external PFM on L>R Cough: no motion Prolapse visible?: no Scar mobility: N/A  Internal Vaginal Exam: Strength (PERF): 2/5, 3  sec. 1 time (improved to 3+/5, 2 times following treatment) Symmetry: similar B. Palpation: TTP throughout Prolapse: none  TODAY: TTP to all PFM internally B. Strength 3/5 following release and able to coordinate squeeze, release and bulge with slow/delayed relaxation and bulge.  Abdominal:  Pt. Has difficulty recruiting glutes, TA, and obliques for posterior pelvic tilt in hook-lying.  ~ 30% improved following treatment.  Palpation: TTP to L Glute Med and Piriformis  Gait Analysis:  INTERVENTIONS THIS SESSION: Manual: Performed TP release to L Glute Med and Piriformis followed by grade 2-4 PA mobs to all sacral borders and MFR using dynamic and static cupping to B posterior hips, thighs, and low back to decrease spasm and pain, increase fascial mobility, and allow for improved balance of musculature for improved function and decreased symptoms.  Theract: Educated on and practiced hip-flexor stretch at [facility] and discussed importance of doing her HEP and made a plan to do her exercises when she goes to her apartment before she goes to work.  Total time: 60 min.                                 PT Short Term Goals - 04/17/20 1339      PT SHORT TERM GOAL #1   Title Patient will demonstrate improved pelvic alignment and balance of musculature surrounding the pelvis to facilitate decreased PFM spasms and decrease pelvic pain.    Baseline R anterior rotation and up-slip, L lumbar/R thoracic scoliosis. Obese, anterior pelvic tilt and spasms surrounding R>L hip    Time 5    Period Weeks    Status New    Target Date 05/22/20      PT SHORT TERM GOAL #2   Title Patient will report a reduction in pain to no greater than 6/10 over the prior week to demonstrate symptom improvement.    Baseline 10/10 max    Time 5    Period Weeks    Status New    Target Date 05/22/20      PT SHORT TERM GOAL #3   Title Patient will demonstrate HEP x1 in the clinic to demonstrate  understanding and proper form to allow for further improvement.    Baseline Pt. lacks knowledge of which exercises will help decrease her Sx.    Time 5    Period Weeks    Status New    Target Date 05/22/20             PT Long Term Goals - 04/17/20 1344      PT LONG TERM GOAL #1   Title Patient will report no episodes of SUI/FI over the course of the prior two weeks to demonstrate improved functional ability.    Baseline UUI requiring # 1 or 2 incontinence pad (more when active). FI occurred ~ 3 times over 4 months at night.    Time 10    Period Weeks  Status New    Target Date 06/26/20      PT LONG TERM GOAL #2   Title Patient will describe pain no greater than 2/10 during a full day of ADL's to demonstrate improved functional ability.    Baseline 10/10 max, 5/10 min.    Time 10    Period Weeks    Status New    Target Date 06/26/20      PT LONG TERM GOAL #3   Title Pt. Will demonstrate a reduction in FOTO score by 10 points in each category to demonstrate improved functional ablility.    Baseline Pt. to fill out FOTO at visit #2    Time 10    Period Weeks    Status New    Target Date 06/26/20                 Plan - 05/31/20 1501    Clinical Impression Statement Pt. Responded well to all interventions today, demonstrating improved fascial mobility and reduction in pain from 6/10 to 1-2/10 as well as understanding and correct performance of all education and exercises provided today. They will continue to benefit from skilled physical therapy to work toward remaining goals and maximize function as well as decrease likelihood of symptom increase or recurrence.    PT Next Visit Plan re-measure/assess for LLD functionally, teach urge suppression technique. Review and practice/progress posterior pelvic tilts in hook-lying. TPDN to B adductors and cupping to B thighs. TPDN/TP release,    PT Home Exercise Plan bow-and-arrow, lumbar rotations, side-stretch, butterfly  adductor stretch, MET correction for L anterior rotation, self TP release to B external PFM and posterior fourchette, posterior pelvic tilts in hook-lying, frog stretch, hip-flexor stretch.    Consulted and Agree with Plan of Care Patient           Patient will benefit from skilled therapeutic intervention in order to improve the following deficits and impairments:     Visit Diagnosis: Other idiopathic scoliosis, thoracolumbar region  Abnormal posture  Other muscle spasm  Other lack of coordination     Problem List Patient Active Problem List   Diagnosis Date Noted   Leg edema 06/22/2019   Essential hypertension 06/22/2019   Fatigue 06/22/2019   Willa Rough DPT, ATC Willa Rough 05/31/2020, 3:04 PM  [place] MAIN [facility] SERVICES [address] [place], [place], [address] Phone: [phone]   Fax:  [phone]  Name: Tanice Petre MRN: [id number] Date of Birth: April 17, 1966

## 2020-06-06 ENCOUNTER — Other Ambulatory Visit: Payer: Self-pay

## 2020-06-06 ENCOUNTER — Ambulatory Visit: Payer: BC Managed Care – PPO

## 2020-06-06 DIAGNOSIS — R278 Other lack of coordination: Secondary | ICD-10-CM

## 2020-06-06 DIAGNOSIS — M4125 Other idiopathic scoliosis, thoracolumbar region: Secondary | ICD-10-CM | POA: Diagnosis not present

## 2020-06-06 DIAGNOSIS — M62838 Other muscle spasm: Secondary | ICD-10-CM

## 2020-06-06 DIAGNOSIS — R293 Abnormal posture: Secondary | ICD-10-CM

## 2020-06-06 NOTE — Therapy (Signed)
[place] MAIN [facility] SERVICES [address] [place], [place], [address] Phone: [phone]   Fax:  [phone]  Physical Therapy Treatment  The patient has been informed of current processes in place at Outpatient Rehab to protect patients from Covid-19 exposure including social distancing, schedule modifications, and new cleaning procedures. After discussing their particular risk with a therapist based on the patient's personal risk factors, the patient has decided to proceed with in-person therapy.  Patient Details  Name: Kimberly Herring MRN: [id number] Date of Birth: April 05, 1966 No data recorded  Encounter Date: 06/06/2020   PT End of Session - 06/06/20 1053    Visit Number 9    Number of Visits 10    Date for PT Re-Evaluation 06/26/20    Authorization Type BCBS    Authorization Time Period 04/12/20 through 10/3 21    Authorization - Visit Number 9    Authorization - Number of Visits 10    Progress Note Due on Visit 10    PT Start Time 0930    PT Stop Time 1030    PT Time Calculation (min) 60 min    Activity Tolerance Patient tolerated treatment well;No increased pain    Behavior During Therapy WFL for tasks assessed/performed           Past Medical History:  Diagnosis Date  . Anxiety   . Arthritis   . Bipolar 2 disorder ([place])   . Depression   . Fibromyalgia   . Migraine aura, persistent     Past Surgical History:  Procedure Laterality Date  . ABDOMINAL HYSTERECTOMY    . CESAREAN SECTION    . ESOPHAGOGASTRODUODENOSCOPY    . FOOT SURGERY Bilateral   . GASTRIC BYPASS    . HERNIA REPAIR    . HIP SURGERY    . KNEE SURGERY Bilateral   . LAPAROSCOPIC OOPHERECTOMY    . SHOULDER SURGERY Bilateral   . TONSILLECTOMY    . TUBAL LIGATION      There were no vitals filed for this visit.  Pelvic Floor Physical Therapy Treatment Note  SCREENING  Changes in medications, allergies, or medical history?: no   SUBJECTIVE  Patient  reports: She is having a hard time doing the hip-flexor stretch because her R knee not wanting to bend enough. She is having a hard time with the "one with the resistance band on her feet". She is not staying with her mother any more, she had to work all weekend to catch up on work from last week.  Precautions:  Anxiety, Arthritis, Bipolar disorder, Depression, Fibromyalgia, Migraines,Had an abdominal hysterectomy, c-section, B hammer-toe repair, gastric bypass (needs revision), hernia repair, hip-surgery (labral repair), B knee surgery to regenerate meniscus, B oophorectomy, B shoulder surgeries for rotator cuff repair in 2012 and 2016, tonsillectomy, tubal ligation.  Pain update:  Location of pain: Lower abdomen/pelvis and vagina on L Current pain: 5/10 (worst deeper) Max pain: 5/10 (with stress) Least pain: 4/10 Nature of pain:"squeezing" achy with sharp jolts.  **3/10 "deep vaginal pain" following treatment  Location of pain:  L hip (LB) Current pain:  3/10 (4/10) Max pain:  5/10 (7/10) Least pain:  0/10 (0/10) Nature of pain: Sharp, dull, achy, shooting.  **2/10 in LB following treatment. "everything feels a little better"  Patient Goals: Wants to not have pain with ADL's.    OBJECTIVE  Changes in: Posture/Observations:  L PSIS high in standing and L anterior rotation with supine-to-long-sit pre-treatment.  **following release and glute  sets on the L Pt. Demonstrated ~ 80% improvement in pelvic alignment with LLE even or slightly short in supine and sitting, L facing pelvis still following treatment but PSIS appear nearly level.   Range of Motion/Flexibilty:   Strength/MMT:  LE MMT:   Pelvic Floor External Exam: (initial assessment 05/02/20) Introitus Appears: elevated Skin integrity: WNL Palpation: TTP throughout all external PFM on L>R Cough: no motion Prolapse visible?: no Scar mobility: N/A  Internal Vaginal Exam: Strength (PERF): 2/5, 3 sec. 1 time  (improved to 3+/5, 2 times following treatment) Symmetry: similar B. Palpation: TTP throughout Prolapse: none  TODAY: not re-assessed today.  Abdominal:  Pt. Has difficulty recruiting glutes, TA, and obliques for L MET correction.  **Pt. Better able to feel how to contract her glutes following TPDN.  Palpation: TTP to L>R Piriformis, Glute Med, Psoas and Iliacus.  Gait Analysis:  INTERVENTIONS THIS SESSION: Manual: Performed TP release and STM to L Piriformis, Glute Med, Psoas and Iliacus to decrease spasm and pain, improve pelvic alignment, and allow for improved balance of musculature for improved function and decreased symptoms.  Dry-needle: Performed TPDN with a .30x133m needle and standard approach as described below to decrease spasm and pain and allow for improved balance of musculature for improved function and decreased symptoms.  Therex: Reviewed hip-flexor and happy-baby stretches to improve performance due to Pt. Misunderstanding to improve efficacy. Educated on and practiced glute sets with knees bent to wake up the glutes and to improve strength of muscles opposing tight musculature to allow reciprocal inhibition to improve balance of musculature surrounding the pelvis and improve overall posture for optimal musculature length-tension relationship and function.  Total time: 60 min.                      Trigger Point Dry Needling - 06/06/20 0001    Consent Given? Yes    Education Handout Provided No    Muscles Treated Back/Hip Gluteus medius;Gluteus maximus;Iliacus;Iliopsoas    Dry Needling Comments Left    Gluteus Medius Response Twitch response elicited;Palpable increased muscle length    Gluteus Maximus Response Twitch response elicited;Palpable increased muscle length    Iliacus Response Twitch response elicited;Palpable increased muscle length    Iliopsoas Response Twitch response elicited;Palpable increased muscle length                   PT Short Term Goals - 04/17/20 1339      PT SHORT TERM GOAL #1   Title Patient will demonstrate improved pelvic alignment and balance of musculature surrounding the pelvis to facilitate decreased PFM spasms and decrease pelvic pain.    Baseline R anterior rotation and up-slip, L lumbar/R thoracic scoliosis. Obese, anterior pelvic tilt and spasms surrounding R>L hip    Time 5    Period Weeks    Status New    Target Date 05/22/20      PT SHORT TERM GOAL #2   Title Patient will report a reduction in pain to no greater than 6/10 over the prior week to demonstrate symptom improvement.    Baseline 10/10 max    Time 5    Period Weeks    Status New    Target Date 05/22/20      PT SHORT TERM GOAL #3   Title Patient will demonstrate HEP x1 in the clinic to demonstrate understanding and proper form to allow for further improvement.    Baseline Pt. lacks knowledge of which exercises will help decrease  her Sx.    Time 5    Period Weeks    Status New    Target Date 05/22/20             PT Long Term Goals - 04/17/20 1344      PT LONG TERM GOAL #1   Title Patient will report no episodes of SUI/FI over the course of the prior two weeks to demonstrate improved functional ability.    Baseline UUI requiring # 1 or 2 incontinence pad (more when active). FI occurred ~ 3 times over 4 months at night.    Time 10    Period Weeks    Status New    Target Date 06/26/20      PT LONG TERM GOAL #2   Title Patient will describe pain no greater than 2/10 during a full day of ADL's to demonstrate improved functional ability.    Baseline 10/10 max, 5/10 min.    Time 10    Period Weeks    Status New    Target Date 06/26/20      PT LONG TERM GOAL #3   Title Pt. Will demonstrate a reduction in FOTO score by 10 points in each category to demonstrate improved functional ablility.    Baseline Pt. to fill out FOTO at visit #2    Time 10    Period Weeks    Status New    Target Date  06/26/20                 Plan - 06/06/20 1054    Clinical Impression Statement Pt. Responded well to all interventions today, demonstrating improved pelvic alignment, decreased spasm and pain, as well as understanding and correct performance of all education and exercises provided today. They will continue to benefit from skilled physical therapy to work toward remaining goals and maximize function as well as decrease likelihood of symptom increase or recurrence.    PT Next Visit Plan re-assess goals and POC!!! re-measure/assess for LLD functionally, teach urge suppression technique. further internal release/coordnation, Review and practice/progress posterior pelvic tilts in hook-lying. TPDN to B adductors and cupping to B thighs(Esp. R anterolateral). TPDN/TP release,    PT Home Exercise Plan bow-and-arrow, lumbar rotations, side-stretch, butterfly adductor stretch, MET correction for L anterior rotation, self TP release to B external PFM and posterior fourchette, posterior pelvic tilts in hook-lying, frog stretch, hip-flexor stretch, glute sets with bent knee.    Consulted and Agree with Plan of Care Patient           Patient will benefit from skilled therapeutic intervention in order to improve the following deficits and impairments:     Visit Diagnosis: Other idiopathic scoliosis, thoracolumbar region  Abnormal posture  Other muscle spasm  Other lack of coordination     Problem List Patient Active Problem List   Diagnosis Date Noted  . Leg edema 06/22/2019  . Essential hypertension 06/22/2019  . Fatigue 06/22/2019   Willa Rough DPT, ATC Willa Rough 06/06/2020, 11:09 AM  [place] MAIN [facility] SERVICES [address] [place], [place], [address] Phone: [phone]   Fax:  [phone]  Name: Christena Sunderlin MRN: [id number] Date of Birth: 10/19/1965

## 2020-06-06 NOTE — Patient Instructions (Signed)
  Push the heel into the bed and hold for 5 seconds. Repeat 5 times. Do this on both sides once per day.

## 2020-06-13 ENCOUNTER — Ambulatory Visit: Payer: BC Managed Care – PPO

## 2020-06-16 ENCOUNTER — Ambulatory Visit: Payer: BC Managed Care – PPO

## 2020-06-16 DIAGNOSIS — R278 Other lack of coordination: Secondary | ICD-10-CM

## 2020-06-16 DIAGNOSIS — M62838 Other muscle spasm: Secondary | ICD-10-CM

## 2020-06-16 DIAGNOSIS — M4125 Other idiopathic scoliosis, thoracolumbar region: Secondary | ICD-10-CM

## 2020-06-16 DIAGNOSIS — R293 Abnormal posture: Secondary | ICD-10-CM

## 2020-06-16 NOTE — Therapy (Signed)
[place] MAIN [facility] SERVICES [address] [place], [place], [address] Phone: [phone]   Fax:  [phone]  Physical Therapy Treatment  The patient has been informed of current processes in place at Outpatient Rehab to protect patients from Covid-19 exposure including social distancing, schedule modifications, and new cleaning procedures. After discussing their particular risk with a therapist based on the patient's personal risk factors, the patient has decided to proceed with in-person therapy.  Patient Details  Name: Kimberly Herring MRN: [id number] Date of Birth: Sep 29, 1965 No data recorded  Encounter Date: 06/16/2020   PT End of Session - 06/16/20 1724    Visit Number 10    Number of Visits 10    Date for PT Re-Evaluation 06/26/20    Authorization Type BCBS    Authorization Time Period 04/12/20 through 10/3 21    Authorization - Visit Number 10    Authorization - Number of Visits 10    Progress Note Due on Visit 10    PT Start Time 8921    PT Stop Time 1530    PT Time Calculation (min) 55 min    Activity Tolerance Patient tolerated treatment well;No increased pain    Behavior During Therapy WFL for tasks assessed/performed           Past Medical History:  Diagnosis Date  . Anxiety   . Arthritis   . Bipolar 2 disorder ([place])   . Depression   . Fibromyalgia   . Migraine aura, persistent     Past Surgical History:  Procedure Laterality Date  . ABDOMINAL HYSTERECTOMY    . CESAREAN SECTION    . ESOPHAGOGASTRODUODENOSCOPY    . FOOT SURGERY Bilateral   . GASTRIC BYPASS    . HERNIA REPAIR    . HIP SURGERY    . KNEE SURGERY Bilateral   . LAPAROSCOPIC OOPHERECTOMY    . SHOULDER SURGERY Bilateral   . TONSILLECTOMY    . TUBAL LIGATION      There were no vitals filed for this visit.  Pelvic Floor Physical Therapy Treatment Note  SCREENING  Changes in medications, allergies, or medical history?: no   SUBJECTIVE  Patient  reports: Her pain has been higher due to increased stress and made her feel sick to her stomach. She finally got to see her nephew who is 59 months old now on Wednesday.   Precautions:  Anxiety, Arthritis, Bipolar disorder, Depression, Fibromyalgia, Migraines,Had an abdominal hysterectomy, c-section, B hammer-toe repair, gastric bypass (needs revision), hernia repair, hip-surgery (labral repair), B knee surgery to regenerate meniscus, B oophorectomy, B shoulder surgeries for rotator cuff repair in 2012 and 2016, tonsillectomy, tubal ligation.  Pain update:  Location of pain: Lower abdomen/pelvis and vagina on L Current pain: 5/10 (worst deeper) Max pain: 5/10 (with stress) Least pain: 4/10 Nature of pain:"squeezing" achy with sharp jolts.  **3/10 "deep vaginal pain" following treatment  Location of pain:  L hip (LB) Current pain:  3/10 (4/10) Max pain:  5/10 (7/10) Least pain:  0/10 (0/10) Nature of pain: Sharp, dull, achy, shooting.  **2/10 in LB following treatment. "everything feels a little better"  Patient Goals: Wants to not have pain with ADL's.    OBJECTIVE  Changes in: Posture/Observations:  L PSIS high in standing and L anterior rotation with supine-to-long-sit pre-treatment. **following release and glute sets on the L Pt. Demonstrated ~ 80% improvement in pelvic alignment with LLE even or slightly short in supine and sitting, L facing pelvis still  following treatment but PSIS appear nearly level. (from prior visit)  TODAY: LLE long in supine and sit.   PSIS appear level with addition of heel-lift.   Range of Motion/Flexibilty:   Strength/MMT:  LE MMT:   Pelvic Floor External Exam: (initial assessment 05/02/20) Introitus Appears: elevated Skin integrity: WNL Palpation: TTP throughout all external PFM on L>R Cough: no motion Prolapse visible?: no Scar mobility: N/A  Internal Vaginal Exam: Strength (PERF): 2/5, 3 sec. 1 time (improved to 3+/5, 2 times  following treatment) Symmetry: similar B. Palpation: TTP throughout Prolapse: none  TODAY: not re-assessed today.  Abdominal:  Pt. Has difficulty recruiting glutes, TA, and obliques for L MET correction. **Pt. Better able to feel how to contract her glutes following TPDN. (from previous session)  Palpation: TTP to R Psoas, Pectineus, and adductor brevis  Gait Analysis:  INTERVENTIONS THIS SESSION: Manual: Performed TP release and STM to R Psoas, Pectineus, and adductor brevis to decrease spasm and pain, improve pelvic alignment, and allow for improved balance of musculature for improved function and decreased symptoms.  Dry-needle: Performed TPDN with a .30x197m needle and standard approach as described below to decrease spasm and pain and allow for improved balance of musculature for improved function and decreased symptoms.  Self-care: Educated Pt. On how to work on setting boundaries with her mom by letting her know that the conversation was increasing her pain and offering to keep discussing it when they are able to talk rather than "fuss" to help prevent pain spikes from increased stress. Reviewed goals and extended POC.  Total time: 60 min.                          Trigger Point Dry Needling - 06/16/20 0001    Consent Given? Yes    Education Handout Provided No    Muscles Treated Lower Quadrant Adductor longus/brevis/magnus    Muscles Treated Back/Hip Iliopsoas    Adductor Response Twitch response elicited;Palpable increased muscle length    Iliopsoas Response Twitch response elicited;Palpable increased muscle length                  PT Short Term Goals - 06/16/20 1458      PT SHORT TERM GOAL #1   Title Patient will demonstrate improved pelvic alignment and balance of musculature surrounding the pelvis to facilitate decreased PFM spasms and decrease pelvic pain.    Baseline R anterior rotation and up-slip, L lumbar/R thoracic scoliosis.  Obese, anterior pelvic tilt and spasms surrounding R>L hip    Time 5    Period Weeks    Status On-going    Target Date 07/21/20      PT SHORT TERM GOAL #2   Title Patient will report a reduction in pain to no greater than 6/10 over the prior week to demonstrate symptom improvement.    Baseline 10/10 max    Time 5    Period Weeks    Status On-going    Target Date 07/21/20      PT SHORT TERM GOAL #3   Title Patient will demonstrate HEP x1 in the clinic to demonstrate understanding and proper form to allow for further improvement.    Baseline Pt. lacks knowledge of which exercises will help decrease her Sx.    Time 5    Period Weeks    Status Achieved    Target Date 05/22/20             PT  Long Term Goals - 06/16/20 1727      PT LONG TERM GOAL #1   Title Patient will report no episodes of SUI/FI over the course of the prior two weeks to demonstrate improved functional ability.    Baseline UUI requiring # 1 or 2 incontinence pad (more when active). FI occurred ~ 3 times over 4 months at night.    Time 10    Period Weeks    Status On-going    Target Date 08/25/20      PT LONG TERM GOAL #2   Title Patient will describe pain no greater than 2/10 during a full day of ADL's to demonstrate improved functional ability.    Baseline 10/10 max, 5/10 min.    Time 10    Period Weeks    Status On-going    Target Date 08/25/20      PT LONG TERM GOAL #3   Title Pt. Will demonstrate a reduction in FOTO score by 10 points in each category to demonstrate improved functional ablility.    Baseline Pt. to fill out FOTO at visit #2    Time 10    Period Weeks    Status On-going    Target Date 08/25/20                 Plan - 06/16/20 1725    Clinical Impression Statement Pt. has been making slow progress with pain due to complexity of her dysfunction in combination with set-backs due to high stress and dealing with knee pain as well. She responds well to all treatment and her pain  was reduced today from 6/10 to 3/10, but she will need further strengthening and manual treatment to work toward longer periods of symptom relief and self-sufficiency with using HEP to mange pain. We implemented a heel-lift today which should help decrease the drive for return of spasms due to her LLE being longer. We will continue for 10 more weeks at 1x/wk to continue to build on current sucesses and achieve all original goals.    Personal Factors and Comorbidities Comorbidity 3+;Fitness    Comorbidities Anxiety, Arthritis, Bipolar disorder, Depression, Fibromyalgia, Migraines,Had an abdominal hysterectomy, c-section, B hammer-toe repair, gastric bypass (needs revision), hernia repair, hip-surgery (labral repair), B knee surgery to regenerate meniscus, B oophorectomy, B shoulder surgeries for rotator cuff repair in 2012 and 2016, tonsillectomy, and  tubal ligation    Examination-Activity Limitations Lift;Stairs;Continence;Bend;Locomotion Level    Examination-Participation Restrictions Yard Work;Cleaning;Laundry;Community Activity    Stability/Clinical Decision Making Unstable/Unpredictable    Clinical Decision Making High    Rehab Potential Fair    PT Frequency 1x / week    PT Duration Other (comment)   10 weeks   PT Treatment/Interventions ADLs/Self Care Home Management;Biofeedback;Moist Heat;Electrical Stimulation;Traction;Ultrasound;Therapeutic activities;Functional mobility training;Stair training;Gait training;Therapeutic exercise;Balance training;Neuromuscular re-education;Orthotic Fit/Training;Patient/family education;Manual techniques;Dry needling;Taping;Spinal Manipulations;Joint Manipulations    PT Next Visit Plan teach urge suppression technique. further internal release/coordnation, Review and practice/progress posterior pelvic tilts in hook-lying. TPDN to B adductors and cupping to B thighs(Esp. R anterolateral). TPDN/TP release,    PT Home Exercise Plan bow-and-arrow, lumbar rotations,  side-stretch, butterfly adductor stretch, MET correction for L anterior rotation, self TP release to B external PFM and posterior fourchette, posterior pelvic tilts in hook-lying, frog stretch, hip-flexor stretch, glute sets with bent knee, heel lift for RLE.    Consulted and Agree with Plan of Care Patient           Patient will benefit from skilled therapeutic intervention  in order to improve the following deficits and impairments:  Abnormal gait, Decreased endurance, Difficulty walking, Increased muscle spasms, Obesity, Improper body mechanics, Impaired tone, Pain, Postural dysfunction, Increased fascial restricitons, Decreased strength, Decreased coordination, Decreased activity tolerance  Visit Diagnosis: Other idiopathic scoliosis, thoracolumbar region  Abnormal posture  Other muscle spasm  Other lack of coordination     Problem List Patient Active Problem List   Diagnosis Date Noted  . Leg edema 06/22/2019  . Essential hypertension 06/22/2019  . Fatigue 06/22/2019   Willa Rough DPT, ATC Willa Rough 06/16/2020, 5:34 PM  [place] MAIN [facility] SERVICES [address] [place], [place], [address] Phone: [phone]   Fax:  [phone]  Name: Kimberly Herring MRN: [id number] Date of Birth: 1966/03/20

## 2020-06-16 NOTE — Patient Instructions (Addendum)
  [place]   As you start wearing your heel-lift only wear it for an hour the first day and increase by an hour each day so you can allow for the body to adapt to the change easily without much pain. If your pain increases by more than 1-2 points, back off slightly or slow down how quickly you increase your wear time. Once you reach a full day of wear, use it as much as possible forever, even in house-shoes or flip-flops if necessary to keep yourself from reverting to bad pelvic and spinal alignment and having symptoms return.    Adjust-a-lift heel lift Can be found at [email or web address]

## 2020-06-27 ENCOUNTER — Ambulatory Visit: Payer: BC Managed Care – PPO | Attending: Obstetrics and Gynecology

## 2020-06-27 ENCOUNTER — Other Ambulatory Visit: Payer: Self-pay

## 2020-06-27 DIAGNOSIS — R293 Abnormal posture: Secondary | ICD-10-CM | POA: Diagnosis present

## 2020-06-27 DIAGNOSIS — M4125 Other idiopathic scoliosis, thoracolumbar region: Secondary | ICD-10-CM | POA: Insufficient documentation

## 2020-06-27 DIAGNOSIS — M62838 Other muscle spasm: Secondary | ICD-10-CM | POA: Diagnosis present

## 2020-06-27 DIAGNOSIS — R278 Other lack of coordination: Secondary | ICD-10-CM | POA: Diagnosis present

## 2020-06-27 NOTE — Therapy (Signed)
[place] MAIN [facility] SERVICES [address] [place], [place], [address] Phone: [phone]   Fax:  [phone]  Physical Therapy Treatment  The patient has been informed of current processes in place at Outpatient Rehab to protect patients from Covid-19 exposure including social distancing, schedule modifications, and new cleaning procedures. After discussing their particular risk with a therapist based on the patient's personal risk factors, the patient has decided to proceed with in-person therapy.  Patient Details  Name: Shoshannah Faubert MRN: [id number] Date of Birth: 05/24/1966 No data recorded  Encounter Date: 06/27/2020   PT End of Session - 06/27/20 1155    Visit Number 11    Number of Visits 20    Date for PT Re-Evaluation 06/26/20    Authorization Type BCBS    Authorization Time Period 06/16/20 through 08/25/20    Authorization - Visit Number 1    Authorization - Number of Visits 10    Progress Note Due on Visit 20    PT Start Time 0940    PT Stop Time 1030    PT Time Calculation (min) 50 min    Activity Tolerance Patient tolerated treatment well;No increased pain    Behavior During Therapy WFL for tasks assessed/performed           Past Medical History:  Diagnosis Date   Anxiety    Arthritis    Bipolar 2 disorder ([place])    Depression    Fibromyalgia    Migraine aura, persistent     Past Surgical History:  Procedure Laterality Date   ABDOMINAL HYSTERECTOMY     CESAREAN SECTION     ESOPHAGOGASTRODUODENOSCOPY     FOOT SURGERY Bilateral    GASTRIC BYPASS     HERNIA REPAIR     HIP SURGERY     KNEE SURGERY Bilateral    LAPAROSCOPIC OOPHERECTOMY     SHOULDER SURGERY Bilateral    TONSILLECTOMY     TUBAL LIGATION      There were no vitals filed for this visit.  Pelvic Floor Physical Therapy Treatment Note  SCREENING  Changes in medications, allergies, or medical history?: no   SUBJECTIVE  Patient  reports: On Friday she was coming back from lunch and all of a sudden her L knee started hurting seriously bad. She felt like putting some compression on it helped some. She went to the ER on Saturday because the pain was so bad, they gave her Dilaudid. Took some tylenol but it was not helping. Took a tramadol and that helped bring the pain level down so she went to go get an ace bandage. She has gotten some relief from using ice/heat alternating and her TENS unit.   Precautions:  Anxiety, Arthritis, Bipolar disorder, Depression, Fibromyalgia, Migraines,Had an abdominal hysterectomy, c-section, B hammer-toe repair, gastric bypass (needs revision), hernia repair, hip-surgery (labral repair), B knee surgery to regenerate meniscus, B oophorectomy, B shoulder surgeries for rotator cuff repair in 2012 and 2016, tonsillectomy, tubal ligation.  Pain update:  Location of pain: Lower abdomen/pelvis and vagina on L Current pain: 5/10 (worst deeper) Max pain: 5/10 (with stress) Least pain: 4/10 Nature of pain:"squeezing" achy with sharp jolts.  **3/10 "deep vaginal pain" following treatment  Location of pain:  L knee Current pain:  7/10 (4/10) Max pain:  10/10 (7/10) Least pain:  2/10 (0/10) Nature of pain: Sharp, dull, achy, shooting.  **3/10 following treatment  Patient Goals: Wants to not have pain with ADL's.    OBJECTIVE  Changes in: Posture/Observations:  L PSIS high in standing and L anterior rotation with supine-to-long-sit pre-treatment. **following release and glute sets on the L Pt. Demonstrated ~ 80% improvement in pelvic alignment with LLE even or slightly short in supine and sitting, L facing pelvis still following treatment but PSIS appear nearly level. (from prior visit)  TODAY: not re-assessed    Range of Motion/Flexibilty:   Strength/MMT:  LE MMT:   Pelvic Floor External Exam: (initial assessment 05/02/20) Introitus Appears: elevated Skin integrity:  WNL Palpation: TTP throughout all external PFM on L>R Cough: no motion Prolapse visible?: no Scar mobility: N/A  Internal Vaginal Exam: Strength (PERF): 2/5, 3 sec. 1 time (improved to 3+/5, 2 times following treatment) Symmetry: similar B. Palpation: TTP throughout Prolapse: none  TODAY: not re-assessed today.  Abdominal:  Pt. Has difficulty recruiting glutes, TA, and obliques for L MET correction. **Pt. Better able to feel how to contract her glutes following TPDN. (from previous session)  Palpation: TTP to L adductors, medial HS and sartorius. TTP at L medial tibial flare.  Gait Analysis:  INTERVENTIONS THIS SESSION: Manual: Performed TP release and STM to L adductors, medial HS and sartorius to decrease spasm and pain, improve pelvic alignment, decrease tension on medial tibial flare insertion, and allow for improved balance of musculature for improved function and decreased symptoms as well as to allow Pt. To perform her HEP to continue to progress with her pelvic health goals.  Dry-needle: Performed TPDN with a .30x152m needle and standard approach as described below to decrease spasm and pain and allow for improved balance of musculature for improved function and decreased symptoms.  Self-care: Educated Pt. On how to apply compression bandage appropriately and to use voltaren and ice without alternating with heat to help decrease inflammation and allow for tissue healing at medial tibial flare common adductor insertion.  Total time: 60 min.                       Trigger Point Dry Needling - 06/27/20 0001    Consent Given? Yes    Education Handout Provided No    Muscles Treated Lower Quadrant Adductor longus/brevis/magnus;Hamstring    Dry Needling Comments Left    Other Dry Needling Sartorius     Adductor Response Twitch response elicited;Palpable increased muscle length    Hamstring Response Twitch response elicited;Palpable increased muscle length                   PT Short Term Goals - 06/16/20 1458      PT SHORT TERM GOAL #1   Title Patient will demonstrate improved pelvic alignment and balance of musculature surrounding the pelvis to facilitate decreased PFM spasms and decrease pelvic pain.    Baseline R anterior rotation and up-slip, L lumbar/R thoracic scoliosis. Obese, anterior pelvic tilt and spasms surrounding R>L hip    Time 5    Period Weeks    Status On-going    Target Date 07/21/20      PT SHORT TERM GOAL #2   Title Patient will report a reduction in pain to no greater than 6/10 over the prior week to demonstrate symptom improvement.    Baseline 10/10 max    Time 5    Period Weeks    Status On-going    Target Date 07/21/20      PT SHORT TERM GOAL #3   Title Patient will demonstrate HEP x1 in the clinic to demonstrate understanding and proper  form to allow for further improvement.    Baseline Pt. lacks knowledge of which exercises will help decrease her Sx.    Time 5    Period Weeks    Status Achieved    Target Date 05/22/20             PT Long Term Goals - 06/16/20 1727      PT LONG TERM GOAL #1   Title Patient will report no episodes of SUI/FI over the course of the prior two weeks to demonstrate improved functional ability.    Baseline UUI requiring # 1 or 2 incontinence pad (more when active). FI occurred ~ 3 times over 4 months at night.    Time 10    Period Weeks    Status On-going    Target Date 08/25/20      PT LONG TERM GOAL #2   Title Patient will describe pain no greater than 2/10 during a full day of ADL's to demonstrate improved functional ability.    Baseline 10/10 max, 5/10 min.    Time 10    Period Weeks    Status On-going    Target Date 08/25/20      PT LONG TERM GOAL #3   Title Pt. Will demonstrate a reduction in FOTO score by 10 points in each category to demonstrate improved functional ablility.    Baseline Pt. to fill out FOTO at visit #2    Time 10    Period Weeks     Status On-going    Target Date 08/25/20                 Plan - 06/27/20 1157    Clinical Impression Statement Pt. Responded well to all interventions today, demonstrating improved gait, decreased spasm and pain, as well as understanding of all education provided today. They will continue to benefit from skilled physical therapy to work toward remaining goals and maximize function as well as decrease likelihood of symptom increase or recurrence.    PT Next Visit Plan Review HEP, teach urge suppression technique. further internal release/coordnation, Review and practice/progress posterior pelvic tilts in hook-lying. TPDN to B adductors and cupping to B thighs(Esp. R anterolateral). TPDN/TP release,    PT Home Exercise Plan bow-and-arrow, lumbar rotations, side-stretch, butterfly adductor stretch, MET correction for L anterior rotation, self TP release to B external PFM and posterior fourchette, posterior pelvic tilts in hook-lying, frog stretch, hip-flexor stretch, glute sets with bent knee, heel lift for RLE.    Consulted and Agree with Plan of Care Patient           Patient will benefit from skilled therapeutic intervention in order to improve the following deficits and impairments:     Visit Diagnosis: Other idiopathic scoliosis, thoracolumbar region  Abnormal posture  Other muscle spasm  Other lack of coordination     Problem List Patient Active Problem List   Diagnosis Date Noted   Leg edema 06/22/2019   Essential hypertension 06/22/2019   Fatigue 06/22/2019   Willa Rough DPT, ATC Willa Rough 06/27/2020, 12:13 PM  [place] MAIN [facility] SERVICES [address] [place], [place], [address] Phone: [phone]   Fax:  [phone]  Name: Britnee Mcdevitt MRN: [id number] Date of Birth: [birth year]

## 2020-07-04 ENCOUNTER — Ambulatory Visit: Payer: BC Managed Care – PPO

## 2020-07-11 ENCOUNTER — Ambulatory Visit: Payer: BC Managed Care – PPO

## 2020-07-11 ENCOUNTER — Other Ambulatory Visit: Payer: Self-pay

## 2020-07-11 DIAGNOSIS — M4125 Other idiopathic scoliosis, thoracolumbar region: Secondary | ICD-10-CM

## 2020-07-11 DIAGNOSIS — R278 Other lack of coordination: Secondary | ICD-10-CM

## 2020-07-11 DIAGNOSIS — R293 Abnormal posture: Secondary | ICD-10-CM

## 2020-07-11 DIAGNOSIS — M62838 Other muscle spasm: Secondary | ICD-10-CM

## 2020-07-11 NOTE — Therapy (Signed)
[place] MAIN [facility] SERVICES [address] [place], [place], [address] Phone: [phone]   Fax:  [phone]  Physical Therapy Treatment  The patient has been informed of current processes in place at Outpatient Rehab to protect patients from Covid-19 exposure including social distancing, schedule modifications, and new cleaning procedures. After discussing their particular risk with a therapist based on the patient's personal risk factors, the patient has decided to proceed with in-person therapy.  Patient Details  Name: Kimberly Herring MRN: [id number] Date of Birth: 02-Jan-1966 No data recorded  Encounter Date: 07/11/2020   PT End of Session - 07/11/20 1520    Visit Number 12    Number of Visits 20    Date for PT Re-Evaluation 06/26/20    Authorization Type BCBS    Authorization Time Period 06/16/20 through 08/25/20    Authorization - Visit Number 2    Authorization - Number of Visits 10    Progress Note Due on Visit 20    PT Start Time 0945    PT Stop Time 1030    PT Time Calculation (min) 45 min    Activity Tolerance Patient tolerated treatment well;No increased pain    Behavior During Therapy WFL for tasks assessed/performed           Past Medical History:  Diagnosis Date  . Anxiety   . Arthritis   . Bipolar 2 disorder ([place])   . Depression   . Fibromyalgia   . Migraine aura, persistent     Past Surgical History:  Procedure Laterality Date  . ABDOMINAL HYSTERECTOMY    . CESAREAN SECTION    . ESOPHAGOGASTRODUODENOSCOPY    . FOOT SURGERY Bilateral   . GASTRIC BYPASS    . HERNIA REPAIR    . HIP SURGERY    . KNEE SURGERY Bilateral   . LAPAROSCOPIC OOPHERECTOMY    . SHOULDER SURGERY Bilateral   . TONSILLECTOMY    . TUBAL LIGATION      There were no vitals filed for this visit.  Pelvic Floor Physical Therapy Treatment Note  SCREENING  Changes in medications, allergies, or medical history?: no   SUBJECTIVE  Patient  reports: She still has some pain in the vaginal area. It is down to 6/10 (from 8/10) in the deeper pelvic area, ~ 4-5/10 (sore achy/dull) in the lower pelvic area. The vulva is still at ~ 7/10. She is not able to perform self release at home due to "not enough elwow room in the tub. Her L knee felt better for ~ 1 week after last session but is really bothering her again. She has an appointment for it tomorrow.  Precautions:  Anxiety, Arthritis, Bipolar disorder, Depression, Fibromyalgia, Migraines,Had an abdominal hysterectomy, c-section, B hammer-toe repair, gastric bypass (needs revision), hernia repair, hip-surgery (labral repair), B knee surgery to regenerate meniscus, B oophorectomy, B shoulder surgeries for rotator cuff repair in 2012 and 2016, tonsillectomy, tubal ligation.  Pain update:  Location of pain: Lower abdomen/pelvis and vulva Current pain: 5/10 (worst deeper) Max pain: 5/10 (with stress) Least pain: 4/10 Nature of pain:"squeezing" achy with sharp jolts.  **2/10 "deep vaginal pain" following treatment  Location of pain:  L knee>(R knee) Current pain:  7/10 (4/10) Max pain:  10/10 (6/10) Least pain:  3/10 (2/10) Nature of pain: Sharp, dull, achy, shooting.  **not addressed today.  Patient Goals: Wants to not have pain with ADL's.    OBJECTIVE  Changes in: Posture/Observations:  L PSIS high in  standing and L anterior rotation with supine-to-long-sit pre-treatment. **following release and glute sets on the L Pt. Demonstrated ~ 80% improvement in pelvic alignment with LLE even or slightly short in supine and sitting, L facing pelvis still following treatment but PSIS appear nearly level. (from prior visit)  TODAY: not re-assessed    Range of Motion/Flexibilty:   Strength/MMT:  LE MMT:   Pelvic Floor External Exam: (initial assessment 05/02/20) Introitus Appears: elevated Skin integrity: WNL Palpation: TTP throughout all external PFM on L>R Cough: no  motion Prolapse visible?: no Scar mobility: N/A  Internal Vaginal Exam: Strength (PERF): 2/5, 3 sec. 1 time (improved to 3+/5, 2 times following treatment) Symmetry: similar B. Palpation: TTP throughout Prolapse: none  TODAY: TTP to B IC and Bulbo externally and through the posterior fourchette B.  Abdominal:  Pt. Has difficulty recruiting glutes, TA, and obliques for L MET correction. **Pt. Better able to feel how to contract her glutes following TPDN. (from previous session)  Palpation:  Gait Analysis:  INTERVENTIONS THIS SESSION: Manual: Performed TP release and STM to B IC and Bulbo externally and through the posterior fourchette B to decrease spasm and pain and allow for improved balance of musculature for improved function and decreased symptoms.   Total time: 45 min.                               PT Short Term Goals - 06/16/20 1458      PT SHORT TERM GOAL #1   Title Patient will demonstrate improved pelvic alignment and balance of musculature surrounding the pelvis to facilitate decreased PFM spasms and decrease pelvic pain.    Baseline R anterior rotation and up-slip, L lumbar/R thoracic scoliosis. Obese, anterior pelvic tilt and spasms surrounding R>L hip    Time 5    Period Weeks    Status On-going    Target Date 07/21/20      PT SHORT TERM GOAL #2   Title Patient will report a reduction in pain to no greater than 6/10 over the prior week to demonstrate symptom improvement.    Baseline 10/10 max    Time 5    Period Weeks    Status On-going    Target Date 07/21/20      PT SHORT TERM GOAL #3   Title Patient will demonstrate HEP x1 in the clinic to demonstrate understanding and proper form to allow for further improvement.    Baseline Pt. lacks knowledge of which exercises will help decrease her Sx.    Time 5    Period Weeks    Status Achieved    Target Date 05/22/20             PT Long Term Goals - 06/16/20 1727      PT  LONG TERM GOAL #1   Title Patient will report no episodes of SUI/FI over the course of the prior two weeks to demonstrate improved functional ability.    Baseline UUI requiring # 1 or 2 incontinence pad (more when active). FI occurred ~ 3 times over 4 months at night.    Time 10    Period Weeks    Status On-going    Target Date 08/25/20      PT LONG TERM GOAL #2   Title Patient will describe pain no greater than 2/10 during a full day of ADL's to demonstrate improved functional ability.    Baseline 10/10 max, 5/10  min.    Time 10    Period Weeks    Status On-going    Target Date 08/25/20      PT LONG TERM GOAL #3   Title Pt. Will demonstrate a reduction in FOTO score by 10 points in each category to demonstrate improved functional ablility.    Baseline Pt. to fill out FOTO at visit #2    Time 10    Period Weeks    Status On-going    Target Date 08/25/20                 Plan - 07/12/20 0910    Clinical Impression Statement Pt. Responded well to all interventions today, demonstrating decreased spasm and pain reduction from 7/10 to 2/10 as well as understanding and correct performance of all education and exercises provided today. They will continue to benefit from skilled physical therapy to work toward remaining goals and maximize function as well as decrease likelihood of symptom increase or recurrence.    PT Next Visit Plan Further internal PFM release, Review HEP, teach urge suppression technique. further internal release/coordnation, Review and practice/progress posterior pelvic tilts in hook-lying. TPDN to B adductors and cupping to B thighs(Esp. R anterolateral). TPDN/TP release,    PT Home Exercise Plan bow-and-arrow, lumbar rotations, side-stretch, butterfly adductor stretch, MET correction for L anterior rotation, self TP release to B external PFM and posterior fourchette, posterior pelvic tilts in hook-lying, frog stretch, hip-flexor stretch, glute sets with bent knee,  heel lift for RLE.    Consulted and Agree with Plan of Care Patient           Patient will benefit from skilled therapeutic intervention in order to improve the following deficits and impairments:     Visit Diagnosis: Other idiopathic scoliosis, thoracolumbar region  Abnormal posture  Other muscle spasm  Other lack of coordination     Problem List Patient Active Problem List   Diagnosis Date Noted  . Leg edema 06/22/2019  . Essential hypertension 06/22/2019  . Fatigue 06/22/2019   Willa Rough DPT, ATC Willa Rough 07/12/2020, 9:20 AM  [place] MAIN [facility] SERVICES [address] [place], [place], [address] Phone: [phone]   Fax:  [phone]  Name: Kimberly Herring MRN: [id number] Date of Birth: 1965/09/28

## 2020-07-18 ENCOUNTER — Ambulatory Visit: Payer: BC Managed Care – PPO

## 2020-07-18 ENCOUNTER — Other Ambulatory Visit: Payer: Self-pay

## 2020-07-18 DIAGNOSIS — M4125 Other idiopathic scoliosis, thoracolumbar region: Secondary | ICD-10-CM | POA: Diagnosis not present

## 2020-07-18 DIAGNOSIS — R293 Abnormal posture: Secondary | ICD-10-CM

## 2020-07-18 DIAGNOSIS — M62838 Other muscle spasm: Secondary | ICD-10-CM

## 2020-07-18 DIAGNOSIS — R278 Other lack of coordination: Secondary | ICD-10-CM

## 2020-07-18 NOTE — Therapy (Signed)
[place] MAIN [facility] SERVICES [address] [place], [place], [address] Phone: [phone]   Fax:  [phone]  Physical Therapy Treatment  The patient has been informed of current processes in place at Outpatient Rehab to protect patients from Covid-19 exposure including social distancing, schedule modifications, and new cleaning procedures. After discussing their particular risk with a therapist based on the patient's personal risk factors, the patient has decided to proceed with in-person therapy.  Patient Details  Name: Kimberly Herring MRN: [id number] Date of Birth: May 07, 1966 No data recorded  Encounter Date: 07/18/2020   PT End of Session - 07/18/20 1139    Visit Number 13    Number of Visits 20    Date for PT Re-Evaluation 06/26/20    Authorization Type [employer]    Authorization Time Period 06/16/20 through 08/25/20    Authorization - Visit Number 3    Authorization - Number of Visits 10    Progress Note Due on Visit 20    PT Start Time 0945    PT Stop Time 1030    PT Time Calculation (min) 45 min    Activity Tolerance Patient tolerated treatment well;No increased pain    Behavior During Therapy WFL for tasks assessed/performed           Past Medical History:  Diagnosis Date  . Anxiety   . Arthritis   . Bipolar 2 disorder ([place])   . Depression   . Fibromyalgia   . Migraine aura, persistent     Past Surgical History:  Procedure Laterality Date  . ABDOMINAL HYSTERECTOMY    . CESAREAN SECTION    . ESOPHAGOGASTRODUODENOSCOPY    . FOOT SURGERY Bilateral   . GASTRIC BYPASS    . HERNIA REPAIR    . HIP SURGERY    . KNEE SURGERY Bilateral   . LAPAROSCOPIC OOPHERECTOMY    . SHOULDER SURGERY Bilateral   . TONSILLECTOMY    . TUBAL LIGATION      There were no vitals filed for this visit.   Pelvic Floor Physical Therapy Treatment Note  SCREENING  Changes in medications, allergies, or medical history?: no   SUBJECTIVE  Patient  reports: Pain in the vulva is ~ 5/10, internally ~ 2/10 today. Knee pain is at ~ 7/10 already this morning. She is going to go ahead and schedule her knee replacement because it has been so bad. Her knees are giving out all the time, even when she is on crutches, and even hurting when she is just sitting.    Precautions:  Anxiety, Arthritis, Bipolar disorder, Depression, Fibromyalgia, Migraines,Had an abdominal hysterectomy, c-section, B hammer-toe repair, gastric bypass (needs revision), hernia repair, hip-surgery (labral repair), B knee surgery to regenerate meniscus, B oophorectomy, B shoulder surgeries for rotator cuff repair in 2012 and 2016, tonsillectomy, tubal ligation.  Pain update:  Location of pain: Lower abdomen/pelvis and vulva Current pain: 5/10 (worst at Vulva) Max pain: 5/10 (with stress) Least pain: 2/10 Nature of pain:"squeezing" achy with sharp jolts.  **no change, not addressed today.  Location of pain:  L knee>(R knee) Current pain:  7/10 (7/10) Max pain:  10/10 (10/10) Least pain:  7/10 (7/10) Nature of pain: Sharp, dull, achy, shooting.  **5/10 following treatment, able to stand and walk without buckling.  Patient Goals: Wants to not have pain with ADL's.    OBJECTIVE  Changes in: Posture/Observations:  L PSIS high in standing and L anterior rotation with supine-to-long-sit pre-treatment. **following release and glute  sets on the L Pt. Demonstrated ~ 80% improvement in pelvic alignment with LLE even or slightly short in supine and sitting, L facing pelvis still following treatment but PSIS appear nearly level. (from prior visit)  TODAY: not re-assessed    Range of Motion/Flexibilty:   Strength/MMT:  LE MMT:   Pelvic Floor External Exam: (initial assessment 05/02/20) Introitus Appears: elevated Skin integrity: WNL Palpation: TTP throughout all external PFM on L>R Cough: no motion Prolapse visible?: no Scar mobility: N/A  Internal Vaginal  Exam: Strength (PERF): 2/5, 3 sec. 1 time (improved to 3+/5, 2 times following treatment) Symmetry: similar B. Palpation: TTP throughout Prolapse: none  TTP to B IC and Bulbo externally and through the posterior fourchette B.(from prior visit)  TODAY: not re-assessed today.  Abdominal:  Pt. Has difficulty recruiting glutes, TA, and obliques for L MET correction. **Pt. Better able to feel how to contract her glutes following TPDN. (from previous session)  Palpation: TTP to B gracilis, adductor magnus, TFL. Vastus lateralis and medialis    Gait Analysis:  INTERVENTIONS THIS SESSION: Manual: Performed TP release and STM to B gracilis, adductor magnus, TFL. Vastus lateralis and medialis to decrease spasm and pain and allow for improved balance of musculature surrounding the pelvis and knees for improved function and decreased symptoms.   Total time: 45 min.                      Trigger Point Dry Needling - 07/18/20 0001    Consent Given? Yes    Education Handout Provided No    Muscles Treated Lower Quadrant Adductor longus/brevis/magnus;Vastus lateralis;Vastus medialis    Muscles Treated Back/Hip Tensor fascia lata    Dry Needling Comments Bilateral    Adductor Response Twitch response elicited;Palpable increased muscle length    Vastus lateralis Response Twitch response elicited;Palpable increased muscle length    Vastus medialis Response Twitch response elicited;Palpable increased muscle length    Tensor Fascia Lata Response Twitch response elicited;Palpable increased muscle length                  PT Short Term Goals - 06/16/20 1458      PT SHORT TERM GOAL #1   Title Patient will demonstrate improved pelvic alignment and balance of musculature surrounding the pelvis to facilitate decreased PFM spasms and decrease pelvic pain.    Baseline R anterior rotation and up-slip, L lumbar/R thoracic scoliosis. Obese, anterior pelvic tilt and spasms  surrounding R>L hip    Time 5    Period Weeks    Status On-going    Target Date 07/21/20      PT SHORT TERM GOAL #2   Title Patient will report a reduction in pain to no greater than 6/10 over the prior week to demonstrate symptom improvement.    Baseline 10/10 max    Time 5    Period Weeks    Status On-going    Target Date 07/21/20      PT SHORT TERM GOAL #3   Title Patient will demonstrate HEP x1 in the clinic to demonstrate understanding and proper form to allow for further improvement.    Baseline Pt. lacks knowledge of which exercises will help decrease her Sx.    Time 5    Period Weeks    Status Achieved    Target Date 05/22/20             PT Long Term Goals - 06/16/20 1727  PT LONG TERM GOAL #1   Title Patient will report no episodes of SUI/FI over the course of the prior two weeks to demonstrate improved functional ability.    Baseline UUI requiring # 1 or 2 incontinence pad (more when active). FI occurred ~ 3 times over 4 months at night.    Time 10    Period Weeks    Status On-going    Target Date 08/25/20      PT LONG TERM GOAL #2   Title Patient will describe pain no greater than 2/10 during a full day of ADL's to demonstrate improved functional ability.    Baseline 10/10 max, 5/10 min.    Time 10    Period Weeks    Status On-going    Target Date 08/25/20      PT LONG TERM GOAL #3   Title Pt. Will demonstrate a reduction in FOTO score by 10 points in each category to demonstrate improved functional ablility.    Baseline Pt. to fill out FOTO at visit #2    Time 10    Period Weeks    Status On-going    Target Date 08/25/20                 Plan - 07/18/20 1140    Clinical Impression Statement Pt. Responded well to all interventions today, demonstrating decreased spasm and pain from 7/10 to 5/10, improved ability to WB without knees "giving out",  as well as understanding and correct performance of all education and exercises provided today.  They will continue to benefit from skilled physical therapy to work toward remaining goals and maximize function as well as decrease likelihood of symptom increase or recurrence.    PT Next Visit Plan Further internal PFM release, Review HEP, teach urge suppression technique. further internal release/coordnation, Review and practice/progress posterior pelvic tilts in hook-lying. TPDN to B adductors and cupping to B thighs(Esp. R anterolateral). TPDN/TP release,    PT Home Exercise Plan bow-and-arrow, lumbar rotations, side-stretch, butterfly adductor stretch, MET correction for L anterior rotation, self TP release to B external PFM and posterior fourchette, posterior pelvic tilts in hook-lying, frog stretch, hip-flexor stretch, glute sets with bent knee, heel lift for RLE.    Consulted and Agree with Plan of Care Patient           Patient will benefit from skilled therapeutic intervention in order to improve the following deficits and impairments:     Visit Diagnosis: Other idiopathic scoliosis, thoracolumbar region  Abnormal posture  Other muscle spasm  Other lack of coordination     Problem List Patient Active Problem List   Diagnosis Date Noted  . Leg edema 06/22/2019  . Essential hypertension 06/22/2019  . Fatigue 06/22/2019   Willa Rough DPT, ATC Willa Rough 07/18/2020, 11:41 AM  [place] MAIN [facility] SERVICES [address] [place], [place], [address] Phone: [phone]   Fax:  [phone]  Name: Kimberly Herring MRN: [id number] Date of Birth: Aug 29, 1966

## 2020-07-25 ENCOUNTER — Ambulatory Visit: Payer: BC Managed Care – PPO | Attending: Obstetrics and Gynecology

## 2020-07-27 ENCOUNTER — Ambulatory Visit (INDEPENDENT_AMBULATORY_CARE_PROVIDER_SITE_OTHER): Payer: BC Managed Care – PPO | Admitting: Orthopaedic Surgery

## 2020-07-27 ENCOUNTER — Ambulatory Visit: Payer: Self-pay

## 2020-07-27 ENCOUNTER — Encounter: Payer: Self-pay | Admitting: Orthopaedic Surgery

## 2020-07-27 VITALS — Ht 62.5 in | Wt 251.0 lb

## 2020-07-27 DIAGNOSIS — M1711 Unilateral primary osteoarthritis, right knee: Secondary | ICD-10-CM

## 2020-07-27 NOTE — Progress Notes (Signed)
Office Visit Note   Patient: Kimberly Herring           Date of Birth: 01-28-1966           MRN: [id number] Visit Date: 07/27/2020              Requested by: Verdie Shire, MD [address],  [place] [address] PCP: Verdie Shire, MD   Assessment & Plan: Visit Diagnoses:  1. Primary osteoarthritis of right knee     Plan: Impression is end-stage right knee DJD with varus deformity.  Again we discussed the fact that her BMI is elevated at 20 but given the fact that she has had gastric bypass surgery and has been unable to achieve a BMI of less than 40 after extensive efforts to do so she has elected to proceed with a right total knee replacement with the understanding that she is at a higher risk for complications related to a higher BMI.  We talked about potential risks of infection, DVT, increased blood loss, risk for amputation related to chronic infection, MI, CVA, even death related to surgery and/or anesthesia.  She also has a platelet dysfunction that is managed by Dr. Gaylyn Cheers at [facility] who we will need to coordinate with in terms of the preoperative protocol for platelet transfusion.  With plan to place her on Xarelto postoperatively.  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  Total face to face encounter time was greater than 25 minutes and over half of this time was spent in counseling and/or coordination of care.  Follow-Up Instructions: Return if symptoms worsen or fail to improve.   Orders:  Orders Placed This Encounter  Procedures  . XR KNEE 3 VIEW RIGHT   No orders of the defined types were placed in this encounter.     Procedures: No procedures performed   Clinical Data: No additional findings.   Subjective: Chief Complaint  Patient presents with  . Right Knee - Pain    Ms. Juhasz is 54 year old female who comes in today for follow-up of her severe right knee pain due to end-stage DJD.  We saw her last time in July 2020 and had  discussed undergoing a right total knee replacement.  She comes in today wanting to schedule the surgery.   Review of Systems  Constitutional: Negative.   HENT: Negative.   Eyes: Negative.   Respiratory: Negative.   Cardiovascular: Negative.   Endocrine: Negative.   Musculoskeletal: Negative.   Neurological: Negative.   Hematological: Negative.   Psychiatric/Behavioral: Negative.   All other systems reviewed and are negative.    Objective: Vital Signs: Ht 5' 2.5" (1.588 m)   Wt 251 lb (113.9 kg)   BMI 45.18 kg/m   Physical Exam Vitals and nursing note reviewed.  Constitutional:      Appearance: She is well-developed.  HENT:     Head: Normocephalic and atraumatic.  Pulmonary:     Effort: Pulmonary effort is normal.  Abdominal:     Palpations: Abdomen is soft.  Musculoskeletal:     Cervical back: Neck supple.  Skin:    General: Skin is warm.     Capillary Refill: Capillary refill takes less than 2 seconds.  Neurological:     Mental Status: She is alert and oriented to person, place, and time.  Psychiatric:        Behavior: Behavior normal.        Thought Content: Thought content normal.  Judgment: Judgment normal.     Ortho Exam Right knee exam is unchanged. Specialty Comments:  No specialty comments available.  Imaging: XR KNEE 3 VIEW RIGHT  Result Date: 07/27/2020 Extensive tricompartmental degenerative joint disease with varus deformity.    PMFS History: Patient Active Problem List   Diagnosis Date Noted  . Leg edema 06/22/2019  . Essential hypertension 06/22/2019  . Fatigue 06/22/2019   Past Medical History:  Diagnosis Date  . Anxiety   . Arthritis   . Bipolar 2 disorder ([place])   . Depression   . Fibromyalgia   . Migraine aura, persistent     History reviewed. No pertinent family history.  Past Surgical History:  Procedure Laterality Date  . ABDOMINAL HYSTERECTOMY    . CESAREAN SECTION    . ESOPHAGOGASTRODUODENOSCOPY    . FOOT  SURGERY Bilateral   . GASTRIC BYPASS    . HERNIA REPAIR    . HIP SURGERY    . KNEE SURGERY Bilateral   . LAPAROSCOPIC OOPHERECTOMY    . SHOULDER SURGERY Bilateral   . TONSILLECTOMY    . TUBAL LIGATION     Social History   Occupational History  . Not on file  Tobacco Use  . Smoking status: Never Smoker  . Smokeless tobacco: Never Used  Vaping Use  . Vaping Use: Never used  Substance and Sexual Activity  . Alcohol use: Yes  . Drug use: No  . Sexual activity: Not on file

## 2020-08-02 ENCOUNTER — Ambulatory Visit: Payer: BC Managed Care – PPO

## 2020-08-02 NOTE — Therapy (Signed)
[place] MAIN [facility] SERVICES [address] [place], [place], [address] Phone: [phone]   Fax:  [phone]  August 02, 2020   No Recipients  Physical Therapy Discharge Summary  Patient: Kimberly Herring  MRN: [id number]  Date of Birth: Jan 25, 1966   Diagnosis: No diagnosis found. No data recorded  The above patient had been seen in Physical Therapy 13 times of 20 treatments scheduled with 2 no shows and 10 cancellations.  The treatment consisted of Manual therapy, therapeutic exercise, self-care education, and therapeutic activity The patient is: Improved  Subjective: At last appointment Pt. Was more concerned about her knee and LB pain at this time, planning on scheduling a knee replacement. Her pelvic pain had not resolved but pain was 5/10 at the highest and 2/10 min. Rather than 10/10 high and 4/10 low reported initially. When she called to cancel remaining appointments she only reported that she is going to "continue on her own".  Discharge Findings: Pt. Requires an interdisciplinary approach due to medical complexity and is trying to manage multiple co-morbidities. She has made moderate improvement but has frequent set-backs and variable compliance with her HEP. She may do better to re-try pelvic PT after addressing her knee and back pain. She would benefit from pain science/ mental health counseling and a structured diet and activity program alongside other medical approaches to achieve maximal outcomes.  Functional Status at Discharge: Independent  Goals Partially Met    Sincerely,  Willa Rough DPT, ATC Willa Rough, PT   CC No Recipients  [place] MAIN [facility] SERVICES [address] [place], [place], [address] Phone: [phone]   Fax:  [phone]  Patient: Kimberly Herring  MRN: [id number]  Date of Birth: 04-21-1966

## 2020-08-09 ENCOUNTER — Telehealth: Payer: Self-pay | Admitting: Physician Assistant

## 2020-08-09 ENCOUNTER — Other Ambulatory Visit: Payer: Self-pay | Admitting: Physician Assistant

## 2020-08-09 NOTE — Telephone Encounter (Signed)
Hi Kimberly Herring,   This is the patient that I mentioned to you earlier today.  I will list hemostasis recommendations below from her hematologist, Dr Gaylyn Cheers.  Would you be able to forward this message to all of the anesthesia APPs/staff to coordinate for day of surgery?  I believe she is currently posted as our third case.      Hemostasis recommendations from Dr. Gaylyn Cheers 1. 1 unit platelets 30-60 minutes prior to procedure 2. DDAVP, 0.3 mcg/kg given 30-60 min prior to procedure 3. Tranexamic acid, 1g IV 30-60 min prior to procedure, and continued as 1300 mcg po tid x 1 week post-op  Thanks you,  Mendel Ryder

## 2020-08-09 NOTE — Telephone Encounter (Signed)
Kathlee Nations, can you call this patient and see if her hematologist has already written the post-op tranexamic acid?  It looks like it was written this past July, but I am unsure whether it was for this procedure.

## 2020-08-10 NOTE — Telephone Encounter (Signed)
Called patient no answer LMOM to return our call. See message below. Need to advise on message below.

## 2020-08-10 NOTE — Progress Notes (Addendum)
Your procedure is scheduled on Monday, Novebmer 29th.  Report to [facility] Main Entrance "A" at 9:00 A.M., and check in at the Admitting office.  Call this number if you have problems the morning of surgery:  [phone]  Call [phone] if you have any questions prior to your surgery date Monday-Friday 8am-4pm   Remember:  Do not eat after midnight the night before your surgery  You may drink clear liquids until 9:20 A.M. the morning of your surgery.   Clear liquids allowed are: Water, Non-Citrus Juices (without pulp), Carbonated Beverages, Clear Tea, Black Coffee Only, and Gatorade  Enhanced Recovery after Surgery for Orthopedics Enhanced Recovery after Surgery is a protocol used to improve the stress on your body and your recovery after surgery.  Patient Instructions  . The night before surgery:  o No food after midnight. ONLY clear liquids after midnight  . The day of surgery (if you have diabetes):  o Drink ONE (1) 10oz bottle of water by 9:20 A.M. the morning of surgery. o This water was given to you during your hospital  pre-op appointment visit.  o Nothing else to drink after completing the  Water         If you have questions, please contact your surgeon's office.   Take these medicines the morning of surgery with A SIP OF WATER dicyclomine (BENTYL)  fluticasone (FLONASE)  gabapentin (NEURONTIN)  hydroxychloroquine (PLAQUENIL) omeprazole (PRILOSEC)  topiramate (TOPAMAX)  If needed: acetaminophen (TYLENOL), fluticasone-salmeterol (ADVAIR HFA), hydrOXYzine (ATARAX/VISTARIL), lamoTRIgine (LAMICTAL),  ondansetron (ZOFRAN) albuterol (PROVENTIL HFA;VENTOLIN HFA)/inhaler -bring with you the morning of surgery    As of today, STOP taking diclofenac Sodium (VOLTAREN), any Aspirin (unless otherwise instructed by your surgeon) Aleve, Naproxen, Ibuprofen, Motrin, Advil, Goody's, BC's, all herbal medications, fish oil, and all vitamins.          WHAT DO I DO ABOUT MY  DIABETES MEDICATION?  The morning of surgery Do NOT take acarbose (PRECOSE).  HOW TO MANAGE YOUR DIABETES BEFORE AND AFTER SURGERY  Why is it important to control my blood sugar before and after surgery? . Improving blood sugar levels before and after surgery helps healing and can limit problems. . A way of improving blood sugar control is eating a healthy diet by: o  Eating less sugar and carbohydrates o  Increasing activity/exercise o  Talking with your doctor about reaching your blood sugar goals . High blood sugars (greater than 180 mg/dL) can raise your risk of infections and slow your recovery, so you will need to focus on controlling your diabetes during the weeks before surgery. . Make sure that the doctor who takes care of your diabetes knows about your planned surgery including the date and location.  How do I manage my blood sugar before surgery? . Check your blood sugar at least 4 times a day, starting 2 days before surgery, to make sure that the level is not too high or low. . Check your blood sugar the morning of your surgery when you wake up and every 2 hours until you get to the Short Stay unit. o If your blood sugar is less than 70 mg/dL, you will need to treat for low blood sugar: - Do not take insulin. - Treat a low blood sugar (less than 70 mg/dL) with  cup of clear juice (cranberry or apple), 4 glucose tablets, OR glucose gel. - Recheck blood sugar in 15 minutes after treatment (to make sure it is greater than 70 mg/dL). If your  blood sugar is not greater than 70 mg/dL on recheck, call [phone] for further instructions. . Report your blood sugar to the short stay nurse when you get to Short Stay.  . If you are admitted to the hospital after surgery: o Your blood sugar will be checked by the staff and you will probably be given insulin after surgery (instead of oral diabetes medicines) to make sure you have good blood sugar levels. o The goal for blood sugar  control after surgery is 80-180 mg/dL.             Do not wear jewelry, make up, or nail polish            Do not wear lotions, powders, perfumes, or deodorant.            Do not shave 48 hours prior to surgery.              Do not bring valuables to the hospital.            [facility] is not responsible for any belongings or valuables.  Do NOT Smoke (Tobacco/Vaping) or drink Alcohol 24 hours prior to your procedure If you use a CPAP at night, you may bring all equipment for your overnight stay.   Contacts, glasses, dentures or bridgework may not be worn into surgery.      For patients admitted to the hospital, discharge time will be determined by your treatment team.   Patients discharged the day of surgery will not be allowed to drive home, and someone needs to stay with them for 24 hours.  Special instructions:   [facility]- Preparing For Surgery  Before surgery, you can play an important role. Because skin is not sterile, your skin needs to be as free of germs as possible. You can reduce the number of germs on your skin by washing with CHG (chlorahexidine gluconate) Soap before surgery.  CHG is an antiseptic cleaner which kills germs and bonds with the skin to continue killing germs even after washing.    Oral Hygiene is also important to reduce your risk of infection.  Remember - BRUSH YOUR TEETH THE MORNING OF SURGERY WITH YOUR REGULAR TOOTHPASTE  Please do not use if you have an allergy to CHG or antibacterial soaps. If your skin becomes reddened/irritated stop using the CHG.  Do not shave (including legs and underarms) for at least 48 hours prior to first CHG shower. It is OK to shave your face.  Please follow these instructions carefully.   1. Shower the NIGHT BEFORE SURGERY and the MORNING OF SURGERY with CHG Soap.   2. If you chose to wash your hair, wash your hair first as usual with your normal shampoo.  3. After you shampoo, rinse your hair and body thoroughly to  remove the shampoo.  4. Use CHG as you would any other liquid soap. You can apply CHG directly to the skin and wash gently with a scrungie or a clean washcloth.   5. Apply the CHG Soap to your body ONLY FROM THE NECK DOWN.  Do not use on open wounds or open sores. Avoid contact with your eyes, ears, mouth and genitals (private parts). Wash Face and genitals (private parts)  with your normal soap.   6. Wash thoroughly, paying special attention to the area where your surgery will be performed.  7. Thoroughly rinse your body with warm water from the neck down.  8. DO NOT shower/wash with your normal  soap after using and rinsing off the CHG Soap.  9. Pat yourself dry with a CLEAN TOWEL.  10. Wear CLEAN PAJAMAS to bed the night before surgery  11. Place CLEAN SHEETS on your bed the night of your first shower and DO NOT SLEEP WITH PETS.  Day of Surgery: Wear Clean/Comfortable clothing the morning of surgery Do not apply any deodorants/lotions.   Remember to brush your teeth WITH YOUR REGULAR TOOTHPASTE.   Please read over the following fact sheets that you were given.

## 2020-08-11 ENCOUNTER — Other Ambulatory Visit: Payer: Self-pay

## 2020-08-11 ENCOUNTER — Encounter (HOSPITAL_COMMUNITY): Payer: Self-pay

## 2020-08-11 ENCOUNTER — Encounter (HOSPITAL_COMMUNITY)
Admission: RE | Admit: 2020-08-11 | Discharge: 2020-08-11 | Disposition: A | Discharge status: Home / Self Care | Payer: BC Managed Care – PPO | Source: Ambulatory Visit | Attending: Physician Assistant | Admitting: Physician Assistant

## 2020-08-11 ENCOUNTER — Encounter (HOSPITAL_COMMUNITY)
Admission: RE | Admit: 2020-08-11 | Discharge: 2020-08-11 | Disposition: A | Discharge status: Home / Self Care | Payer: BC Managed Care – PPO | Source: Ambulatory Visit | Attending: Orthopaedic Surgery | Admitting: Orthopaedic Surgery

## 2020-08-11 DIAGNOSIS — F32A Depression, unspecified: Secondary | ICD-10-CM | POA: Insufficient documentation

## 2020-08-11 DIAGNOSIS — Z79899 Other long term (current) drug therapy: Secondary | ICD-10-CM | POA: Diagnosis not present

## 2020-08-11 DIAGNOSIS — M1711 Unilateral primary osteoarthritis, right knee: Secondary | ICD-10-CM | POA: Insufficient documentation

## 2020-08-11 DIAGNOSIS — F419 Anxiety disorder, unspecified: Secondary | ICD-10-CM | POA: Insufficient documentation

## 2020-08-11 DIAGNOSIS — Z01818 Encounter for other preprocedural examination: Secondary | ICD-10-CM | POA: Diagnosis present

## 2020-08-11 DIAGNOSIS — Z9884 Bariatric surgery status: Secondary | ICD-10-CM | POA: Insufficient documentation

## 2020-08-11 DIAGNOSIS — J45909 Unspecified asthma, uncomplicated: Secondary | ICD-10-CM | POA: Insufficient documentation

## 2020-08-11 DIAGNOSIS — Z791 Long term (current) use of non-steroidal anti-inflammatories (NSAID): Secondary | ICD-10-CM | POA: Insufficient documentation

## 2020-08-11 DIAGNOSIS — Z85118 Personal history of other malignant neoplasm of bronchus and lung: Secondary | ICD-10-CM | POA: Diagnosis not present

## 2020-08-11 DIAGNOSIS — Z6841 Body Mass Index (BMI) 40.0 and over, adult: Secondary | ICD-10-CM | POA: Insufficient documentation

## 2020-08-11 DIAGNOSIS — M797 Fibromyalgia: Secondary | ICD-10-CM | POA: Diagnosis not present

## 2020-08-11 DIAGNOSIS — D649 Anemia, unspecified: Secondary | ICD-10-CM | POA: Diagnosis not present

## 2020-08-11 DIAGNOSIS — D691 Qualitative platelet defects: Secondary | ICD-10-CM | POA: Insufficient documentation

## 2020-08-11 DIAGNOSIS — Z7951 Long term (current) use of inhaled steroids: Secondary | ICD-10-CM | POA: Diagnosis not present

## 2020-08-11 HISTORY — DX: Postsurgical malabsorption, not elsewhere classified: K91.2

## 2020-08-11 HISTORY — DX: Family history of other specified conditions: Z84.89

## 2020-08-11 HISTORY — DX: Other complications of anesthesia, initial encounter: T88.59XA

## 2020-08-11 HISTORY — DX: Systemic involvement of connective tissue, unspecified: M35.9

## 2020-08-11 HISTORY — DX: Other nonspecific abnormal finding of lung field: R91.8

## 2020-08-11 HISTORY — DX: Unspecified asthma, uncomplicated: J45.909

## 2020-08-11 HISTORY — DX: Anemia, unspecified: D64.9

## 2020-08-11 HISTORY — DX: Pneumonia, unspecified organism: J18.9

## 2020-08-11 HISTORY — DX: Essential (primary) hypertension: I10

## 2020-08-11 HISTORY — DX: Qualitative platelet defects: D69.1

## 2020-08-11 LAB — COMPREHENSIVE METABOLIC PANEL
ALT: 16 U/L (ref 0–44)
AST: 20 U/L (ref 15–41)
Albumin: 4.1 g/dL (ref 3.5–5.0)
Alkaline Phosphatase: 102 U/L (ref 38–126)
Anion gap: 9 (ref 5–15)
BUN: 9 mg/dL (ref 6–20)
CO2: 25 mmol/L (ref 22–32)
Calcium: 9.4 mg/dL (ref 8.9–10.3)
Chloride: 111 mmol/L (ref 98–111)
Creatinine, Ser: 1.01 mg/dL — ABNORMAL HIGH (ref 0.44–1.00)
GFR, Estimated: >60 mL/min (ref >60)
Glucose, Bld: 42 mg/dL — CL (ref 70–99)
Potassium: 3.3 mmol/L — ABNORMAL LOW (ref 3.5–5.1)
Sodium: 145 mmol/L (ref 135–145)
Total Bilirubin: 0.3 mg/dL (ref 0.3–1.2)
Total Protein: 6.9 g/dL (ref 6.5–8.1)

## 2020-08-11 LAB — URINALYSIS, ROUTINE W REFLEX MICROSCOPIC
Bilirubin Urine: NEGATIVE
Glucose, UA: NEGATIVE mg/dL
Hgb urine dipstick: NEGATIVE
Ketones, ur: NEGATIVE mg/dL
Leukocytes,Ua: NEGATIVE
Nitrite: NEGATIVE
Protein, ur: NEGATIVE mg/dL
Specific Gravity, Urine: 1.017 (ref 1.005–1.030)
pH: 5 (ref 5.0–8.0)

## 2020-08-11 LAB — CBC WITH DIFFERENTIAL/PLATELET
Abs Immature Granulocytes: 0.04 10*3/uL (ref 0.00–0.07)
Basophils Absolute: 0.1 10*3/uL (ref 0.0–0.1)
Basophils Relative: 1 %
Eosinophils Absolute: 0.1 10*3/uL (ref 0.0–0.5)
Eosinophils Relative: 2 %
HCT: 43.1 % (ref 36.0–46.0)
Hemoglobin: 12.7 g/dL (ref 12.0–15.0)
Immature Granulocytes: 1 %
Lymphocytes Relative: 31 %
Lymphs Abs: 1.9 10*3/uL (ref 0.7–4.0)
MCH: 27 pg (ref 26.0–34.0)
MCHC: 29.5 g/dL — ABNORMAL LOW (ref 30.0–36.0)
MCV: 91.5 fL (ref 80.0–100.0)
Monocytes Absolute: 0.7 10*3/uL (ref 0.1–1.0)
Monocytes Relative: 12 %
Neutro Abs: 3.3 10*3/uL (ref 1.7–7.7)
Neutrophils Relative %: 53 %
Platelets: 235 10*3/uL (ref 150–400)
RBC: 4.71 MIL/uL (ref 3.87–5.11)
RDW: 15.4 % (ref 11.5–15.5)
WBC: 6.1 10*3/uL (ref 4.0–10.5)
nRBC: 0 % (ref 0.0–0.2)

## 2020-08-11 LAB — HEMOGLOBIN A1C
Hgb A1c MFr Bld: 5.2 % (ref 4.8–5.6)
Mean Plasma Glucose: 102.54 mg/dL

## 2020-08-11 LAB — PROTIME-INR
INR: 1 (ref 0.8–1.2)
Prothrombin Time: 13.1 seconds (ref 11.4–15.2)

## 2020-08-11 LAB — SURGICAL PCR SCREEN
MRSA, PCR: NEGATIVE
Staphylococcus aureus: NEGATIVE

## 2020-08-11 LAB — APTT: aPTT: 32 seconds (ref 24–36)

## 2020-08-11 NOTE — Progress Notes (Signed)
PCP - Dr. Verdie Shire Cardiologist - Denies  PPM/ICD - Denies  Chest x-ray - 08/11/20 EKG - 07/26/20 - CE Stress Test - Denies ECHO - 07/03/2019 Cardiac Cath - Denies  Sleep Study - Denies  Patient denies having diabetes.  Blood Thinner Instructions: N/A Aspirin Instructions: N/A  ERAS Protcol - Yes PRE-SURGERY Ensure - Yes  COVID TEST- 08/19/20   Anesthesia review: Yes, EKG from 07/26/20 requested.  Patient denies shortness of breath, fever, cough and chest pain at PAT appointment   All instructions explained to the patient, with a verbal understanding of the material. Patient agrees to go over the instructions while at home for a better understanding. Patient also instructed to self quarantine after being tested for COVID-19. The opportunity to ask questions was provided.

## 2020-08-11 NOTE — Progress Notes (Signed)
Your procedure is scheduled on Monday, Novebmer 29th.  Report to [facility] Main Entrance "A" at 9:00 A.M., and check in at the Admitting office.  Call this number if you have problems the morning of surgery:  [phone]  Call [phone] if you have any questions prior to your surgery date Monday-Friday 8am-4pm   Remember:  Do not eat after midnight the night before your surgery  You may drink clear liquids until 9:20 A.M. the morning of your surgery.   Clear liquids allowed are: Water, Non-Citrus Juices (without pulp), Carbonated Beverages, Clear Tea, Black Coffee Only, and Gatorade  Enhanced Recovery after Surgery for Orthopedics Enhanced Recovery after Surgery is a protocol used to improve the stress on your body and your recovery after surgery.  Patient Instructions  . The night before surgery:  o No food after midnight. ONLY clear liquids after midnight  . The day of surgery (if you have diabetes):  o Drink ONE (1) 10oz bottle of water by 9:20 A.M. the morning of surgery. o This water was given to you during your hospital  pre-op appointment visit.  o Nothing else to drink after completing the  Water         If you have questions, please contact your surgeon's office.   Take these medicines the morning of surgery with A SIP OF WATER dicyclomine (BENTYL)  fluticasone (FLONASE)  gabapentin (NEURONTIN)  hydroxychloroquine (PLAQUENIL) omeprazole (PRILOSEC)  topiramate (TOPAMAX) verapamil (CALAN-SR)   If needed: acetaminophen (TYLENOL), fluticasone-salmeterol (ADVAIR HFA), hydrOXYzine (ATARAX/VISTARIL), lamoTRIgine (LAMICTAL),  ondansetron (ZOFRAN) albuterol (PROVENTIL HFA;VENTOLIN HFA)/inhaler -bring with you the morning of surgery    As of today, STOP taking diclofenac Sodium (VOLTAREN), any Aspirin (unless otherwise instructed by your surgeon) Aleve, Naproxen, Ibuprofen, Motrin, Advil, Goody's, BC's, all herbal medications, fish oil, and all vitamins.           WHAT DO I DO ABOUT MY DIABETES MEDICATION?  The morning of surgery Do NOT take acarbose (PRECOSE).  HOW TO MANAGE YOUR DIABETES BEFORE AND AFTER SURGERY  Why is it important to control my blood sugar before and after surgery? . Improving blood sugar levels before and after surgery helps healing and can limit problems. . A way of improving blood sugar control is eating a healthy diet by: o  Eating less sugar and carbohydrates o  Increasing activity/exercise o  Talking with your doctor about reaching your blood sugar goals . High blood sugars (greater than 180 mg/dL) can raise your risk of infections and slow your recovery, so you will need to focus on controlling your diabetes during the weeks before surgery. . Make sure that the doctor who takes care of your diabetes knows about your planned surgery including the date and location.  How do I manage my blood sugar before surgery? . Check your blood sugar at least 4 times a day, starting 2 days before surgery, to make sure that the level is not too high or low. . Check your blood sugar the morning of your surgery when you wake up and every 2 hours until you get to the Short Stay unit. o If your blood sugar is less than 70 mg/dL, you will need to treat for low blood sugar: - Do not take insulin. - Treat a low blood sugar (less than 70 mg/dL) with  cup of clear juice (cranberry or apple), 4 glucose tablets, OR glucose gel. - Recheck blood sugar in 15 minutes after treatment (to make sure it is greater than 70  mg/dL). If your blood sugar is not greater than 70 mg/dL on recheck, call [phone] for further instructions. . Report your blood sugar to the short stay nurse when you get to Short Stay.  . If you are admitted to the hospital after surgery: o Your blood sugar will be checked by the staff and you will probably be given insulin after surgery (instead of oral diabetes medicines) to make sure you have good blood sugar levels. o The  goal for blood sugar control after surgery is 80-180 mg/dL.             Do not wear jewelry, make up, or nail polish            Do not wear lotions, powders, perfumes, or deodorant.            Do not shave 48 hours prior to surgery.              Do not bring valuables to the hospital.            [facility] is not responsible for any belongings or valuables.  Do NOT Smoke (Tobacco/Vaping) or drink Alcohol 24 hours prior to your procedure If you use a CPAP at night, you may bring all equipment for your overnight stay.   Contacts, glasses, dentures or bridgework may not be worn into surgery.      For patients admitted to the hospital, discharge time will be determined by your treatment team.   Patients discharged the day of surgery will not be allowed to drive home, and someone needs to stay with them for 24 hours.  Special instructions:   [place]- Preparing For Surgery  Before surgery, you can play an important role. Because skin is not sterile, your skin needs to be as free of germs as possible. You can reduce the number of germs on your skin by washing with CHG (chlorahexidine gluconate) Soap before surgery.  CHG is an antiseptic cleaner which kills germs and bonds with the skin to continue killing germs even after washing.    Oral Hygiene is also important to reduce your risk of infection.  Remember - BRUSH YOUR TEETH THE MORNING OF SURGERY WITH YOUR REGULAR TOOTHPASTE  Please do not use if you have an allergy to CHG or antibacterial soaps. If your skin becomes reddened/irritated stop using the CHG.  Do not shave (including legs and underarms) for at least 48 hours prior to first CHG shower. It is OK to shave your face.  Please follow these instructions carefully.   1. Shower the NIGHT BEFORE SURGERY and the MORNING OF SURGERY with CHG Soap.   2. If you chose to wash your hair, wash your hair first as usual with your normal shampoo.  3. After you shampoo, rinse your hair and  body thoroughly to remove the shampoo.  4. Use CHG as you would any other liquid soap. You can apply CHG directly to the skin and wash gently with a scrungie or a clean washcloth.   5. Apply the CHG Soap to your body ONLY FROM THE NECK DOWN.  Do not use on open wounds or open sores. Avoid contact with your eyes, ears, mouth and genitals (private parts). Wash Face and genitals (private parts)  with your normal soap.   6. Wash thoroughly, paying special attention to the area where your surgery will be performed.  7. Thoroughly rinse your body with warm water from the neck down.  8. DO NOT shower/wash  with your normal soap after using and rinsing off the CHG Soap.  9. Pat yourself dry with a CLEAN TOWEL.  10. Wear CLEAN PAJAMAS to bed the night before surgery  11. Place CLEAN SHEETS on your bed the night of your first shower and DO NOT SLEEP WITH PETS.  Day of Surgery: Wear Clean/Comfortable clothing the morning of surgery Do not apply any deodorants/lotions.   Remember to brush your teeth WITH YOUR REGULAR TOOTHPASTE.   Please read over the following fact sheets that you were given.

## 2020-08-11 NOTE — Progress Notes (Signed)
.  CRITICAL VALUE ALERT  Critical Value:  Glucose 42  Date & Time Notied:  08/11/20 @ 6568  Provider Notified: Msg sent to Dwana Melena, PA-C  Orders Received/Actions taken: N/A

## 2020-08-11 NOTE — Progress Notes (Signed)
Your procedure is scheduled on Monday, Novebmer 29th.  Report to [facility] Main Entrance "A" at 9:00 A.M., and check in at the Admitting office.  Call this number if you have problems the morning of surgery:  [phone]  Call [phone] if you have any questions prior to your surgery date Monday-Friday 8am-4pm   Remember:  Do not eat after midnight the night before your surgery  You may drink clear liquids until 9:20 A.M. the morning of your surgery.   Clear liquids allowed are: Water, Non-Citrus Juices (without pulp), Carbonated Beverages, Clear Tea, Black Coffee Only, and Gatorade  Enhanced Recovery after Surgery for Orthopedics Enhanced Recovery after Surgery is a protocol used to improve the stress on your body and your recovery after surgery.  Patient Instructions  . The night before surgery:  o No food after midnight. ONLY clear liquids after midnight  . The day of surgery (if you do not have diabetes):  o Drink ONE (1) Pre-Surgery Ensure by 9:20 A.M. the morning of surgery. o This water was given to you during your hospital  pre-op appointment visit.  o Nothing else to drink after completing the  Pre-Surgery Ensure         If you have questions, please contact your surgeon's office.   Take these medicines the morning of surgery with A SIP OF WATER dicyclomine (BENTYL)  fluticasone (FLONASE)  gabapentin (NEURONTIN)  hydroxychloroquine (PLAQUENIL) omeprazole (PRILOSEC)  topiramate (TOPAMAX) verapamil (CALAN-SR)   If needed: acetaminophen (TYLENOL), fluticasone-salmeterol (ADVAIR HFA), hydrOXYzine (ATARAX/VISTARIL), lamoTRIgine (LAMICTAL),  ondansetron (ZOFRAN) albuterol (PROVENTIL HFA;VENTOLIN HFA)/inhaler -bring with you the morning of surgery    As of today, STOP taking diclofenac Sodium (VOLTAREN), any Aspirin (unless otherwise instructed by your surgeon) Aleve, Naproxen, Ibuprofen, Motrin, Advil, Goody's, BC's, all herbal medications, fish oil, and all  vitamins.                      Do not wear jewelry, make up, or nail polish            Do not wear lotions, powders, perfumes, or deodorant.            Do not shave 48 hours prior to surgery.              Do not bring valuables to the hospital.            [facility] is not responsible for any belongings or valuables.  Do NOT Smoke (Tobacco/Vaping) or drink Alcohol 24 hours prior to your procedure If you use a CPAP at night, you may bring all equipment for your overnight stay.   Contacts, glasses, dentures or bridgework may not be worn into surgery.      For patients admitted to the hospital, discharge time will be determined by your treatment team.   Patients discharged the day of surgery will not be allowed to drive home, and someone needs to stay with them for 24 hours.  Special instructions:   [place]- Preparing For Surgery  Before surgery, you can play an important role. Because skin is not sterile, your skin needs to be as free of germs as possible. You can reduce the number of germs on your skin by washing with CHG (chlorahexidine gluconate) Soap before surgery.  CHG is an antiseptic cleaner which kills germs and bonds with the skin to continue killing germs even after washing.    Oral Hygiene is also important to reduce your risk of infection.  Remember - BRUSH YOUR TEETH THE MORNING OF SURGERY WITH YOUR REGULAR TOOTHPASTE  Please do not use if you have an allergy to CHG or antibacterial soaps. If your skin becomes reddened/irritated stop using the CHG.  Do not shave (including legs and underarms) for at least 48 hours prior to first CHG shower. It is OK to shave your face.  Please follow these instructions carefully.   1. Shower the NIGHT BEFORE SURGERY and the MORNING OF SURGERY with CHG Soap.   2. If you chose to wash your hair, wash your hair first as usual with your normal shampoo.  3. After you shampoo, rinse your hair and body thoroughly to remove the  shampoo.  4. Use CHG as you would any other liquid soap. You can apply CHG directly to the skin and wash gently with a scrungie or a clean washcloth.   5. Apply the CHG Soap to your body ONLY FROM THE NECK DOWN.  Do not use on open wounds or open sores. Avoid contact with your eyes, ears, mouth and genitals (private parts). Wash Face and genitals (private parts)  with your normal soap.   6. Wash thoroughly, paying special attention to the area where your surgery will be performed.  7. Thoroughly rinse your body with warm water from the neck down.  8. DO NOT shower/wash with your normal soap after using and rinsing off the CHG Soap.  9. Pat yourself dry with a CLEAN TOWEL.  10. Wear CLEAN PAJAMAS to bed the night before surgery  11. Place CLEAN SHEETS on your bed the night of your first shower and DO NOT SLEEP WITH PETS.  Day of Surgery: Wear Clean/Comfortable clothing the morning of surgery Do not apply any deodorants/lotions.   Remember to brush your teeth WITH YOUR REGULAR TOOTHPASTE.   Please read over the following fact sheets that you were given.

## 2020-08-12 ENCOUNTER — Encounter: Payer: Self-pay | Admitting: Orthopaedic Surgery

## 2020-08-12 ENCOUNTER — Encounter (HOSPITAL_COMMUNITY): Payer: Self-pay

## 2020-08-12 ENCOUNTER — Other Ambulatory Visit: Payer: Self-pay | Admitting: Physician Assistant

## 2020-08-12 LAB — TYPE AND SCREEN
ABO/RH(D): O POS
Antibody Screen: NEGATIVE

## 2020-08-12 MED ORDER — POTASSIUM CHLORIDE ER 10 MEQ PO TBCR: EXTENDED_RELEASE_TABLET | ORAL | 0 refills | Status: DC

## 2020-08-12 NOTE — Anesthesia Preprocedure Evaluation (Addendum)
Anesthesia Evaluation  Patient identified by MRN, date of birth, ID band Patient awake    Reviewed: Allergy & Precautions, NPO status , Patient's Chart, lab work & pertinent test results  Airway Mallampati: II  TM Distance: >3 FB Neck ROM: Full    Dental no notable dental hx. (+) Teeth Intact, Dental Advisory Given   Pulmonary asthma , pneumonia, resolved,    Pulmonary exam normal breath sounds clear to auscultation       Cardiovascular hypertension, Pt. on medications Normal cardiovascular exam Rhythm:Regular Rate:Normal  Echo 10/20  1. Left ventricular ejection fraction, by visual estimation, is 60 to  65%. The left ventricle has normal function. Normal left ventricular size.  There is no left ventricular hypertrophy.    Neuro/Psych  Headaches, PSYCHIATRIC DISORDERS Anxiety Depression Bipolar Disorder  Neuromuscular disease    GI/Hepatic negative GI ROS, Neg liver ROS,   Endo/Other  negative endocrine ROS  Renal/GU negative Renal ROSK+ 3.3 Cr 1.01  negative genitourinary   Musculoskeletal  (+) Arthritis , Fibromyalgia -  Abdominal (+) + obese,   Peds  Hematology  (+) Blood dyscrasia, anemia , Platelet storage dysfunction Hgb 12.7 Plt 235    Anesthesia Other Findings   Reproductive/Obstetrics negative OB ROS                          Anesthesia Physical Anesthesia Plan  ASA: III  Anesthesia Plan: General   Post-op Pain Management:  Regional for Post-op pain   Induction:   PONV Risk Score and Plan: 4 or greater and Treatment may vary due to age or medical condition, Ondansetron, Dexamethasone and Midazolam  Airway Management Planned: Oral ETT  Additional Equipment: None  Intra-op Plan:   Post-operative Plan: Extubation in OR  Informed Consent: I have reviewed the patients History and Physical, chart, labs and discussed the procedure including the risks, benefits and  alternatives for the proposed anesthesia with the patient or authorized representative who has indicated his/her understanding and acceptance.     Dental advisory given  Plan Discussed with: CRNA and Anesthesiologist  Anesthesia Plan Comments: (See PAT note written 08/12/2020 by Myra Gianotti, PA-C. 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 Communicated with Dr. Erlinda Hong that general anesthesia (instead of spinal) was anticipated given her platelet disorder. Patient to arrive at [facility] for after 12 PM surgery and will get CBG given hypoglycemia history.  GA w R adductor canal block Per [facility] Hematologist Dr. Gaylyn Cheers hemostasis plan for procedure: 1. 1 units platelets 30-60 min prior to procedure 2. DDAVP, 0.3 mcg/kg given 30-60 min prior to procedure 3. Tranexamic acid, 1g IV 30-60 min prior to procedure, and continued as 1300 mg po tid x 1 week post-operatively.   CBG on arrival for surgery ordered given hypoglycemia history. )     Anesthesia Quick Evaluation

## 2020-08-12 NOTE — Progress Notes (Addendum)
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  BMI is consistent with morbid obesity. Lung cancer in 1999 is in her history, but otherwise no other details.   Hematology perioperative recommendations (see also [place]): "Patient with Platelet Storage Pool Disorder, planning R knee TKA with Dr. Eduard Roux at [facility], TBS.  Per Dr. Gaylyn Cheers hemostasis plan for procedure:  1. 1 units platelets 30-60 min prior to procedure 2. DDAVP, 0.3 mcg/kg given 30-60 min prior to procedure 3. Tranexamic acid, 1g IV 30-60 min prior to procedure, and continued as 1300 mg po tid x 1 week post-operatively.  All orders and length of hospitalization to be managed by local orthopedic surgery team.  Patient should avoid Aspirin and NSAIDs. Patient to call orthopedic surgery team and [facility] for bleeding concerns...  For bleeding or emergent concerns, page the [facility] on call through the [place], [phone].  Please call the [place] with general questions or concerns at [phone]."   She had a preoperative cardiology evaluation by Dr. Rockey Situ in 2020 and felt to be "acceptable risk" for surgery with as needed follow-up.  Patient's preoperative labs showed K 3.3 and low glucose of 42. She has known post-bariatric surgery  hypoglycemia that is followed by endocrinology. She is allowed clear liquids prior to her arrival time. She is coming in at 9:00 AM due to needing platelets, DDAVP, and TXA prior to surgery. She will get a CBG on arrival.  I reviewed platelet disorder with anesthesiologist Suzette Battiest, MD. Orthopedics has entered day of surgery medication as recommended by hematology. Case is posted for spinal, but given her history general anesthesia is anticipated which was communicated to Dr. Erlinda Hong and Aundra Dubin, PA-C.  I left a voice message for her to contact me about her Butrans patch--typically we hold 3 days before surgery if okay with her pain management provider, so I wanted her to clarify their recommendations and also ask her about "lung cancer" history that is listed.   ADDENDUM 08/15/20 10:34 AM: I spoke with Ms. Harnden this morning. We discussed typical management of holding Butrans for three days prior to surgery if okay with prescriber. I encouraged her to discuss further with her pain management provider. We also discussed her glucose of 42 with her PAT labs. She is going to add this to her glucose results that she sends to her endocrinologist. She says it can be difficult for her to know when she is hypoglycemic, and sometimes those around her are the first to notice that she is acting "confused." She has a glucose meter at home, and knows to monitor CBGs while NPO and has instructions regarding treating hypoglycemia while NPO with her PAT paperwork. I also told her I had ordered a CBG on arrival. (I also sent Dr. Erlinda Hong and Aundra Dubin, PA-C a message about hypoglycemia history to take into consideration with post-operative orders. While requiring IVF, Delsa Sale  with DM Coordinator recommended using an IVF with dextrose.) In regards to her lung history, she does not recall any treatment for lung "cancer" but rather said around 1999 had lung nodules (2 on one side and one on the other) that had  resolved on follow-up imaging but had some residual "scar tissue". 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 In reviewing records in [place], there is mention of "atypical pneumonia" history and CT thorax 03/13/20 [facility]) showed "a few small foci of increased parenchymal density that could be sequela of the previous pneumonia. No large areas of consolidation of suspicious pulmonary masses ares seen." 08/11/20 CXR showed no acute abnormality. She denied any postoperative pulmonary issues. I updated history based on input patient provided.    VS: BP 130/69   Pulse 91   Temp 36.7 C (Oral)   Resp 18   Ht 5\' 2"  (1.575 m)   Wt 112.4 kg   SpO2 99%   BMI 45.32 kg/m    PROVIDERS: Verdie Shire, MD is PCP [facility] Care Everywhere) - Sterling Big, MD is hematologist [facility] Care Everywhere) - Ida Rogue, MD is cardiologist. Seen once on 06/23/19 for preoperative evaluation. Echo ordered, but otherwise no further cardiac testing felt indicated and "acceptable risk".  Echo showed normal LVEF and valves. As needed cardiology follow-up. Kathyrn Sheriff, [place], is rheumatologist ([place]) for follow-up undifferentiated connective tissue disease (UCTD). No active autoimmune disease noted as of 06/20/20 visit.  Richardson Landry, MD, MD is endocrinologist ([place]) for follow-up post-bariatric surgery hypoglyecmia. She is being treated with [facility] - Pain Medicine is through [place] with Karene Fry, MD is neurologist (for headaches). See [facility].    LABS: Preoperative labs noted. See DISCUSSION. A1c 5.2%. (all labs ordered are listed, but only abnormal results are displayed)  Labs Reviewed  CBC WITH DIFFERENTIAL/PLATELET - Abnormal; Notable for the following components:      Result Value   MCHC 29.5 (*)    All other components within normal limits  COMPREHENSIVE METABOLIC PANEL - Abnormal; Notable for the following  components:   Potassium 3.3 (*)    Glucose, Bld 42 (*)    Creatinine, Ser 1.01 (*)    All other components within normal limits  SURGICAL PCR SCREEN  APTT  PROTIME-INR  URINALYSIS, ROUTINE W REFLEX MICROSCOPIC  HEMOGLOBIN A1C  TYPE AND SCREEN     IMAGES: CXR 08/11/20: FINDINGS: The heart size and mediastinal contours are within normal limits. Both lungs are clear. The visualized skeletal structures are unremarkable. IMPRESSION: No acute abnormality of the lungs.   EKG: 07/26/20 ([facility]): NSR. PR 142 ms. QT 350 ms, QTc 432 ms.   CV: Echo 07/03/19: IMPRESSIONS  1. Left ventricular ejection fraction, by visual estimation, is 60 to  65%. The left ventricle has normal function. Normal left ventricular size.  There is no left ventricular hypertrophy.  2. Left ventricular diastolic Doppler parameters are consistent with  impaired relaxation pattern of LV diastolic filling.  3. Global right ventricle has normal systolic function.The right  ventricular size is normal. No increase in right ventricular wall  thickness.  4. Left atrial size was normal.  5. TR signal is inadequate for assessing pulmonary artery systolic  pressure.    Past Medical History:  Diagnosis Date  . Anemia   . Anxiety   . Arthritis   . Asthma   . Bipolar 2 disorder ([place])   .  Complication of anesthesia    hard to put to sleep  . Depression   . Family history of adverse reaction to anesthesia    Mother is hard to wake up  . Fibromyalgia   . Hypertension   . Hypoglycemia following gastrointestinal surgery    post-bariatric surgery hypoglycemia (followed at [facility])  . Lung nodules    lung nodules ~1999; diagnosed wtih "Legionnaires", reported follow-up imaging showed nodules had resolved but some "scar tissue" present  . Migraine aura, persistent   . Platelet storage pool disease [facility])    with history of abnormal post-operative bleeding. (Hematologist Dr. Sterling Big, MD  at [facility])  . Pneumonia   . Undifferentiated connective tissue disease [facility])    [facility]    Past Surgical History:  Procedure Laterality Date  . ABDOMINAL HYSTERECTOMY    . CESAREAN SECTION    . ESOPHAGOGASTRODUODENOSCOPY    . FOOT SURGERY Bilateral   . GASTRIC BYPASS    . HERNIA REPAIR    . HIP SURGERY    . KNEE SURGERY Bilateral   . LAPAROSCOPIC OOPHERECTOMY    . SHOULDER SURGERY Bilateral   . TONSILLECTOMY    . TUBAL LIGATION      MEDICATIONS: . acarbose (PRECOSE) 25 MG tablet  . acetaminophen (TYLENOL) 500 MG tablet  . albuterol (PROVENTIL HFA;VENTOLIN HFA) 108 (90 BASE) MCG/ACT inhaler  . Asenapine Maleate (SAPHRIS) 10 MG SUBL  . buprenorphine (BUTRANS) 15 MCG/HR  . Calcium Carb-Cholecalciferol (CALCIUM 500 + D PO)  . cetirizine (ZYRTEC) 10 MG tablet  . desipramine (NORPRAMIN) 50 MG tablet  . diclofenac Sodium (VOLTAREN) 1 % GEL  . dicyclomine (BENTYL) 10 MG capsule  . DULoxetine (CYMBALTA) 60 MG capsule  . fluticasone (FLONASE) 50 MCG/ACT nasal spray  . fluticasone-salmeterol (ADVAIR HFA) 115-21 MCG/ACT inhaler  . folic acid (FOLVITE) 1 MG tablet  . gabapentin (NEURONTIN) 800 MG tablet  . Galcanezumab-gnlm (EMGALITY) 120 MG/ML SOSY  . Glucagon 3 MG/DOSE POWD  . hydroxychloroquine (PLAQUENIL) 200 MG tablet  . hydrOXYzine (ATARAX/VISTARIL) 25 MG tablet  . lamoTRIgine (LAMICTAL) 200 MG tablet  . latanoprost (XALATAN) 0.005 % ophthalmic solution  . linaclotide (LINZESS) 290 MCG CAPS capsule  . Methotrexate 25 MG/ML SOSY  . Multiple Vitamin (MULTIVITAMIN) tablet  . omeprazole (PRILOSEC) 40 MG capsule  . ondansetron (ZOFRAN) 4 MG tablet  . ondansetron (ZOFRAN-ODT) 4 MG disintegrating tablet  . potassium chloride (KLOR-CON) 10 MEQ tablet  . Probiotic Product (PROBIOTIC PO)  . tiZANidine (ZANAFLEX) 4 MG tablet  . topiramate (TOPAMAX) 25 MG tablet  . tranexamic acid (LYSTEDA) 650 MG TABS tablet  . verapamil (CALAN-SR) 120 MG CR tablet   No current  facility-administered medications for this encounter.    Myra Gianotti, PA-C Surgical Short Stay/Anesthesiology [facility] Phone [phone] [facility] Phone [phone] 08/12/2020 2:48 PM

## 2020-08-12 NOTE — Telephone Encounter (Signed)
Call #2  Called patient no answer. LMOM to return our call. Need to advise both messages below. Will try again later.    Candice Camp, RMA Can you let her know that she has low potassium and that I have called in meds to start taking 4 full days prior to surgery.

## 2020-08-12 NOTE — Progress Notes (Signed)
Can you let her know that she has low potassium and that I have called in meds to start taking 4 full days prior to surgery

## 2020-08-12 NOTE — Telephone Encounter (Signed)
Call # 3  No answer. Will try again Monday.

## 2020-08-15 ENCOUNTER — Encounter (HOSPITAL_COMMUNITY): Payer: Self-pay

## 2020-08-15 NOTE — Telephone Encounter (Signed)
Patient sent [place] a mychart msg. See mychart msg

## 2020-08-16 NOTE — Telephone Encounter (Signed)
Ok, hopefully she will call us back.  Lauren and I have both looked and cannot find a [employer]

## 2020-08-17 ENCOUNTER — Other Ambulatory Visit: Payer: Self-pay | Admitting: Physician Assistant

## 2020-08-17 MED ORDER — DOCUSATE SODIUM 100 MG PO CAPS: 100.0000 mg | ORAL_CAPSULE | Freq: Every day | ORAL | 2 refills | Status: DC | PRN

## 2020-08-17 MED ORDER — ONDANSETRON HCL 4 MG PO TABS: 4.0000 mg | ORAL_TABLET | Freq: Three times a day (TID) | ORAL | 0 refills | Status: DC | PRN

## 2020-08-17 MED ORDER — HYDROCODONE-ACETAMINOPHEN 7.5-325 MG PO TABS
1.0000 | ORAL_TABLET | Freq: Four times a day (QID) | ORAL | 0 refills | Status: DC | PRN
Start: 2020-08-17 — End: 2020-08-23

## 2020-08-19 ENCOUNTER — Other Ambulatory Visit
Admission: RE | Admit: 2020-08-19 | Discharge: 2020-08-19 | Disposition: A | Discharge status: Home / Self Care | Payer: BC Managed Care – PPO | Source: Ambulatory Visit | Attending: Orthopaedic Surgery | Admitting: Orthopaedic Surgery

## 2020-08-19 ENCOUNTER — Other Ambulatory Visit: Payer: Self-pay

## 2020-08-19 DIAGNOSIS — Z01812 Encounter for preprocedural laboratory examination: Secondary | ICD-10-CM | POA: Insufficient documentation

## 2020-08-19 DIAGNOSIS — Z20822 Contact with and (suspected) exposure to covid-19: Secondary | ICD-10-CM | POA: Diagnosis not present

## 2020-08-19 LAB — SARS CORONAVIRUS 2 (TAT 6-24 HRS): SARS Coronavirus 2: NEGATIVE

## 2020-08-19 MED ORDER — BUPIVACAINE LIPOSOME 1.3 % IJ SUSP
20.0000 mL | Freq: Once | INTRAMUSCULAR | Status: DC
  Filled 2020-08-19: qty 20

## 2020-08-19 MED ORDER — TRANEXAMIC ACID 1000 MG/10ML IV SOLN
2000.0000 mg | INTRAVENOUS | Status: DC
  Filled 2020-08-19: qty 20

## 2020-08-21 DIAGNOSIS — M1711 Unilateral primary osteoarthritis, right knee: Secondary | ICD-10-CM

## 2020-08-22 ENCOUNTER — Inpatient Hospital Stay (HOSPITAL_COMMUNITY)
Admission: RE | Admit: 2020-08-22 | Discharge: 2020-08-25 | DRG: 470 | Disposition: A | Discharge status: Skilled Nursing Facility | Payer: BC Managed Care – PPO | Attending: Orthopaedic Surgery | Admitting: Orthopaedic Surgery

## 2020-08-22 ENCOUNTER — Encounter (HOSPITAL_COMMUNITY)
Admission: RE | Disposition: A | Discharge status: Skilled Nursing Facility | Payer: Self-pay | Source: Home / Self Care | Attending: Orthopaedic Surgery

## 2020-08-22 ENCOUNTER — Ambulatory Visit (HOSPITAL_COMMUNITY): Payer: BC Managed Care – PPO | Admitting: Vascular Surgery

## 2020-08-22 ENCOUNTER — Other Ambulatory Visit: Payer: Self-pay

## 2020-08-22 ENCOUNTER — Observation Stay (HOSPITAL_COMMUNITY): Payer: BC Managed Care – PPO

## 2020-08-22 ENCOUNTER — Encounter (HOSPITAL_COMMUNITY): Payer: Self-pay | Admitting: Orthopaedic Surgery

## 2020-08-22 DIAGNOSIS — Z87892 Personal history of anaphylaxis: Secondary | ICD-10-CM

## 2020-08-22 DIAGNOSIS — F32A Depression, unspecified: Secondary | ICD-10-CM | POA: Diagnosis present

## 2020-08-22 DIAGNOSIS — Z8701 Personal history of pneumonia (recurrent): Secondary | ICD-10-CM

## 2020-08-22 DIAGNOSIS — M25761 Osteophyte, right knee: Secondary | ICD-10-CM | POA: Diagnosis present

## 2020-08-22 DIAGNOSIS — M1711 Unilateral primary osteoarthritis, right knee: Principal | ICD-10-CM

## 2020-08-22 DIAGNOSIS — J45909 Unspecified asthma, uncomplicated: Secondary | ICD-10-CM | POA: Diagnosis present

## 2020-08-22 DIAGNOSIS — Z1159 Encounter for screening for other viral diseases: Secondary | ICD-10-CM

## 2020-08-22 DIAGNOSIS — I10 Essential (primary) hypertension: Secondary | ICD-10-CM | POA: Diagnosis present

## 2020-08-22 DIAGNOSIS — E669 Obesity, unspecified: Secondary | ICD-10-CM | POA: Diagnosis present

## 2020-08-22 DIAGNOSIS — Z888 Allergy status to other drugs, medicaments and biological substances status: Secondary | ICD-10-CM

## 2020-08-22 DIAGNOSIS — Z881 Allergy status to other antibiotic agents status: Secondary | ICD-10-CM

## 2020-08-22 DIAGNOSIS — Z96651 Presence of right artificial knee joint: Secondary | ICD-10-CM

## 2020-08-22 DIAGNOSIS — M797 Fibromyalgia: Secondary | ICD-10-CM | POA: Diagnosis present

## 2020-08-22 DIAGNOSIS — D62 Acute posthemorrhagic anemia: Secondary | ICD-10-CM | POA: Diagnosis not present

## 2020-08-22 DIAGNOSIS — Z79899 Other long term (current) drug therapy: Secondary | ICD-10-CM

## 2020-08-22 DIAGNOSIS — Z90710 Acquired absence of both cervix and uterus: Secondary | ICD-10-CM

## 2020-08-22 DIAGNOSIS — Z6841 Body Mass Index (BMI) 40.0 and over, adult: Secondary | ICD-10-CM

## 2020-08-22 DIAGNOSIS — Z9884 Bariatric surgery status: Secondary | ICD-10-CM

## 2020-08-22 DIAGNOSIS — Z20822 Contact with and (suspected) exposure to covid-19: Secondary | ICD-10-CM | POA: Diagnosis present

## 2020-08-22 DIAGNOSIS — Z91048 Other nonmedicinal substance allergy status: Secondary | ICD-10-CM

## 2020-08-22 DIAGNOSIS — F3181 Bipolar II disorder: Secondary | ICD-10-CM | POA: Diagnosis present

## 2020-08-22 DIAGNOSIS — Z885 Allergy status to narcotic agent status: Secondary | ICD-10-CM

## 2020-08-22 DIAGNOSIS — F419 Anxiety disorder, unspecified: Secondary | ICD-10-CM | POA: Diagnosis present

## 2020-08-22 HISTORY — PX: TOTAL KNEE ARTHROPLASTY: SHX125

## 2020-08-22 LAB — GLUCOSE, CAPILLARY
Glucose-Capillary: 61 mg/dL — ABNORMAL LOW (ref 70–99)
Glucose-Capillary: 83 mg/dL (ref 70–99)
Glucose-Capillary: 86 mg/dL (ref 70–99)
Glucose-Capillary: 91 mg/dL (ref 70–99)
Glucose-Capillary: 86 mg/dL (ref 70–99)

## 2020-08-22 SURGERY — ARTHROPLASTY, KNEE, TOTAL
Anesthesia: General | Site: Knee | Laterality: Right

## 2020-08-22 MED ORDER — CEFAZOLIN SODIUM-DEXTROSE 2-4 GM/100ML-% IV SOLN
2.0000 g | INTRAVENOUS | Status: AC
  Administered 2020-08-22: 2 g via INTRAVENOUS

## 2020-08-22 MED ORDER — ROCURONIUM BROMIDE 10 MG/ML (PF) SYRINGE
PREFILLED_SYRINGE | INTRAVENOUS | Status: AC
  Filled 2020-08-22: qty 10

## 2020-08-22 MED ORDER — POVIDONE-IODINE 10 % EX SWAB
2.0000 "application " | Freq: Once | CUTANEOUS | Status: AC
  Administered 2020-08-22: 2 via TOPICAL

## 2020-08-22 MED ORDER — DEXTROSE 50 % IV SOLN
INTRAVENOUS | Status: AC
  Administered 2020-08-22: 25 mL via INTRAVENOUS
  Filled 2020-08-22: qty 50

## 2020-08-22 MED ORDER — TRANEXAMIC ACID 650 MG PO TABS
1300.0000 mg | ORAL_TABLET | Freq: Three times a day (TID) | ORAL | Status: DC
  Administered 2020-08-22 – 2020-08-25 (×8): 1300 mg via ORAL
  Filled 2020-08-22 (×10): qty 2

## 2020-08-22 MED ORDER — PHENYLEPHRINE HCL-NACL 10-0.9 MG/250ML-% IV SOLN
INTRAVENOUS | Status: DC | PRN
  Administered 2020-08-22: 30 ug/min via INTRAVENOUS

## 2020-08-22 MED ORDER — HYDROXYZINE HCL 25 MG PO TABS: 25.0000 mg | ORAL_TABLET | Freq: Three times a day (TID) | ORAL | Status: DC | PRN

## 2020-08-22 MED ORDER — ACARBOSE 50 MG PO TABS
50.0000 mg | ORAL_TABLET | Freq: Three times a day (TID) | ORAL | Status: DC
  Filled 2020-08-22: qty 1

## 2020-08-22 MED ORDER — HYDROCODONE-ACETAMINOPHEN 7.5-325 MG PO TABS
1.0000 | ORAL_TABLET | ORAL | Status: DC | PRN
  Administered 2020-08-22 – 2020-08-25 (×9): 1 via ORAL
  Filled 2020-08-22 (×10): qty 1

## 2020-08-22 MED ORDER — ONDANSETRON HCL 4 MG/2ML IJ SOLN
4.0000 mg | Freq: Four times a day (QID) | INTRAMUSCULAR | Status: DC | PRN
  Administered 2020-08-24: 4 mg via INTRAVENOUS
  Filled 2020-08-22: qty 2

## 2020-08-22 MED ORDER — LATANOPROST 0.005 % OP SOLN
1.0000 [drp] | Freq: Every day | OPHTHALMIC | Status: DC
  Administered 2020-08-22 – 2020-08-24 (×3): 1 [drp] via OPHTHALMIC
  Filled 2020-08-22 (×2): qty 2.5

## 2020-08-22 MED ORDER — HYDROMORPHONE HCL 1 MG/ML IJ SOLN
0.5000 mg | INTRAMUSCULAR | Status: DC | PRN
  Administered 2020-08-22 – 2020-08-25 (×10): 1 mg via INTRAVENOUS
  Filled 2020-08-22 (×11): qty 1

## 2020-08-22 MED ORDER — DULOXETINE HCL 60 MG PO CPEP
120.0000 mg | ORAL_CAPSULE | Freq: Every day | ORAL | Status: DC
  Administered 2020-08-22 – 2020-08-24 (×3): 120 mg via ORAL
  Filled 2020-08-22 (×3): qty 2

## 2020-08-22 MED ORDER — ALBUTEROL SULFATE HFA 108 (90 BASE) MCG/ACT IN AERS
2.0000 | INHALATION_SPRAY | Freq: Four times a day (QID) | RESPIRATORY_TRACT | Status: DC | PRN
  Administered 2020-08-22 – 2020-08-24 (×2): 2 via RESPIRATORY_TRACT
  Filled 2020-08-22: qty 6.7

## 2020-08-22 MED ORDER — LIDOCAINE HCL (PF) 2 % IJ SOLN
INTRAMUSCULAR | Status: AC
  Filled 2020-08-22: qty 5

## 2020-08-22 MED ORDER — TOPIRAMATE 25 MG PO TABS
100.0000 mg | ORAL_TABLET | Freq: Every day | ORAL | Status: DC
  Administered 2020-08-22 – 2020-08-24 (×3): 100 mg via ORAL
  Filled 2020-08-22 (×5): qty 4

## 2020-08-22 MED ORDER — SODIUM CHLORIDE 0.9 % IV SOLN
0.3000 ug/kg | Freq: Once | INTRAVENOUS | Status: AC
  Administered 2020-08-22: 34 ug via INTRAVENOUS
  Filled 2020-08-22: qty 8.5

## 2020-08-22 MED ORDER — BUPIVACAINE LIPOSOME 1.3 % IJ SUSP
INTRAMUSCULAR | Status: DC | PRN
  Administered 2020-08-22: 20 mL

## 2020-08-22 MED ORDER — ACETAMINOPHEN 500 MG PO TABS
1000.0000 mg | ORAL_TABLET | Freq: Four times a day (QID) | ORAL | Status: AC
  Administered 2020-08-22 – 2020-08-23 (×2): 1000 mg via ORAL
  Administered 2020-08-23: 500 mg via ORAL
  Filled 2020-08-22 (×3): qty 2

## 2020-08-22 MED ORDER — LACTATED RINGERS IV SOLN: INTRAVENOUS | Status: DC

## 2020-08-22 MED ORDER — FENTANYL CITRATE (PF) 100 MCG/2ML IJ SOLN
25.0000 ug | INTRAMUSCULAR | Status: DC | PRN
  Administered 2020-08-22 (×2): 50 ug via INTRAVENOUS

## 2020-08-22 MED ORDER — TRANEXAMIC ACID-NACL 1000-0.7 MG/100ML-% IV SOLN
1000.0000 mg | Freq: Once | INTRAVENOUS | Status: AC
  Administered 2020-08-23: 1000 mg via INTRAVENOUS
  Filled 2020-08-22: qty 100

## 2020-08-22 MED ORDER — CEFAZOLIN SODIUM-DEXTROSE 2-4 GM/100ML-% IV SOLN
INTRAVENOUS | Status: AC
  Administered 2020-08-23: 2 g via INTRAVENOUS
  Filled 2020-08-22: qty 100

## 2020-08-22 MED ORDER — SODIUM CHLORIDE 0.9% FLUSH
INTRAVENOUS | Status: DC | PRN
  Administered 2020-08-22: 20 mL

## 2020-08-22 MED ORDER — ONDANSETRON HCL 4 MG/2ML IJ SOLN
INTRAMUSCULAR | Status: AC
  Filled 2020-08-22: qty 2

## 2020-08-22 MED ORDER — ONDANSETRON HCL 4 MG/2ML IJ SOLN
INTRAMUSCULAR | Status: DC | PRN
  Administered 2020-08-22: 4 mg via INTRAVENOUS

## 2020-08-22 MED ORDER — ROCURONIUM BROMIDE 10 MG/ML (PF) SYRINGE
PREFILLED_SYRINGE | INTRAVENOUS | Status: DC | PRN
  Administered 2020-08-22: 90 mg via INTRAVENOUS

## 2020-08-22 MED ORDER — ROPIVACAINE HCL 7.5 MG/ML IJ SOLN
INTRAMUSCULAR | Status: DC | PRN
  Administered 2020-08-22: 20 mL via PERINEURAL

## 2020-08-22 MED ORDER — ONDANSETRON HCL 4 MG PO TABS: 4.0000 mg | ORAL_TABLET | Freq: Four times a day (QID) | ORAL | Status: DC | PRN

## 2020-08-22 MED ORDER — DESIPRAMINE HCL 50 MG PO TABS
50.0000 mg | ORAL_TABLET | Freq: Every day | ORAL | Status: DC
  Administered 2020-08-22 – 2020-08-24 (×3): 50 mg via ORAL
  Filled 2020-08-22 (×5): qty 1

## 2020-08-22 MED ORDER — CEFAZOLIN SODIUM-DEXTROSE 2-4 GM/100ML-% IV SOLN
2.0000 g | Freq: Four times a day (QID) | INTRAVENOUS | Status: AC
  Administered 2020-08-22: 2 g via INTRAVENOUS
  Filled 2020-08-22 (×2): qty 100

## 2020-08-22 MED ORDER — DEXAMETHASONE SODIUM PHOSPHATE 10 MG/ML IJ SOLN
INTRAMUSCULAR | Status: AC
  Filled 2020-08-22: qty 1

## 2020-08-22 MED ORDER — BUPIVACAINE HCL (PF) 0.25 % IJ SOLN
INTRAMUSCULAR | Status: AC
  Filled 2020-08-22: qty 30

## 2020-08-22 MED ORDER — ALUM & MAG HYDROXIDE-SIMETH 200-200-20 MG/5ML PO SUSP: 30.0000 mL | ORAL | Status: DC | PRN

## 2020-08-22 MED ORDER — VERAPAMIL HCL ER 120 MG PO TBCR
120.0000 mg | EXTENDED_RELEASE_TABLET | Freq: Two times a day (BID) | ORAL | Status: DC
  Administered 2020-08-22 – 2020-08-25 (×6): 120 mg via ORAL
  Filled 2020-08-22 (×7): qty 1

## 2020-08-22 MED ORDER — HYDROMORPHONE HCL 1 MG/ML IJ SOLN
INTRAMUSCULAR | Status: AC
  Administered 2020-08-23: 1 mg via INTRAVENOUS
  Filled 2020-08-22: qty 1

## 2020-08-22 MED ORDER — DOCUSATE SODIUM 100 MG PO CAPS
100.0000 mg | ORAL_CAPSULE | Freq: Two times a day (BID) | ORAL | Status: DC
  Administered 2020-08-22 – 2020-08-25 (×6): 100 mg via ORAL
  Filled 2020-08-22 (×6): qty 1

## 2020-08-22 MED ORDER — METOCLOPRAMIDE HCL 5 MG/ML IJ SOLN: 5.0000 mg | Freq: Three times a day (TID) | INTRAMUSCULAR | Status: DC | PRN

## 2020-08-22 MED ORDER — VANCOMYCIN HCL 1000 MG IV SOLR
INTRAVENOUS | Status: DC | PRN
  Administered 2020-08-22: 1000 mg

## 2020-08-22 MED ORDER — LIDOCAINE 2% (20 MG/ML) 5 ML SYRINGE
INTRAMUSCULAR | Status: DC | PRN
  Administered 2020-08-22: 80 mg via INTRAVENOUS

## 2020-08-22 MED ORDER — POLYETHYLENE GLYCOL 3350 17 G PO PACK: 17.0000 g | PACK | Freq: Every day | ORAL | Status: DC | PRN

## 2020-08-22 MED ORDER — ACETAMINOPHEN 500 MG PO TABS: 1000.0000 mg | ORAL_TABLET | Freq: Three times a day (TID) | ORAL | Status: DC | PRN

## 2020-08-22 MED ORDER — FENTANYL CITRATE (PF) 250 MCG/5ML IJ SOLN
INTRAMUSCULAR | Status: DC | PRN
  Administered 2020-08-22: 150 ug via INTRAVENOUS
  Administered 2020-08-22: 50 ug via INTRAVENOUS

## 2020-08-22 MED ORDER — GABAPENTIN 400 MG PO CAPS
800.0000 mg | ORAL_CAPSULE | Freq: Three times a day (TID) | ORAL | Status: DC
  Administered 2020-08-22 – 2020-08-25 (×8): 800 mg via ORAL
  Filled 2020-08-22 (×8): qty 2

## 2020-08-22 MED ORDER — MAGNESIUM CITRATE PO SOLN
1.0000 | Freq: Once | ORAL | Status: AC | PRN
  Administered 2020-08-25: 1 via ORAL
  Filled 2020-08-22: qty 296

## 2020-08-22 MED ORDER — MIDAZOLAM HCL 2 MG/2ML IJ SOLN: 2.0000 mg | Freq: Once | INTRAMUSCULAR | Status: AC

## 2020-08-22 MED ORDER — FOLIC ACID 1 MG PO TABS
1.0000 mg | ORAL_TABLET | Freq: Two times a day (BID) | ORAL | Status: DC
  Administered 2020-08-22 – 2020-08-25 (×6): 1 mg via ORAL
  Filled 2020-08-22 (×6): qty 1

## 2020-08-22 MED ORDER — PHENOL 1.4 % MT LIQD: 1.0000 | OROMUCOSAL | Status: DC | PRN

## 2020-08-22 MED ORDER — ACETAMINOPHEN 325 MG PO TABS: 325.0000 mg | ORAL_TABLET | Freq: Four times a day (QID) | ORAL | Status: DC | PRN

## 2020-08-22 MED ORDER — GLUCAGON 3 MG/DOSE NA POWD: 1.0000 | NASAL | Status: DC | PRN

## 2020-08-22 MED ORDER — METOCLOPRAMIDE HCL 5 MG PO TABS: 5.0000 mg | ORAL_TABLET | Freq: Three times a day (TID) | ORAL | Status: DC | PRN

## 2020-08-22 MED ORDER — SODIUM CHLORIDE 0.9% IV SOLUTION: Freq: Once | INTRAVENOUS | Status: AC

## 2020-08-22 MED ORDER — TOPIRAMATE 25 MG PO TABS
25.0000 mg | ORAL_TABLET | Freq: Every day | ORAL | Status: DC
  Administered 2020-08-23 – 2020-08-25 (×3): 25 mg via ORAL
  Filled 2020-08-22 (×3): qty 1

## 2020-08-22 MED ORDER — OXYCODONE HCL 5 MG PO TABS: 10.0000 mg | ORAL_TABLET | ORAL | Status: DC | PRN

## 2020-08-22 MED ORDER — FENTANYL CITRATE (PF) 100 MCG/2ML IJ SOLN
INTRAMUSCULAR | Status: AC
  Administered 2020-08-22: 100 ug via INTRAVENOUS
  Filled 2020-08-22: qty 2

## 2020-08-22 MED ORDER — ACETAMINOPHEN 10 MG/ML IV SOLN
INTRAVENOUS | Status: AC
  Filled 2020-08-22: qty 100

## 2020-08-22 MED ORDER — METHOTREXATE 25 MG/ML ~~LOC~~ SOSY: 15.0000 mg | PREFILLED_SYRINGE | SUBCUTANEOUS | Status: DC

## 2020-08-22 MED ORDER — FENTANYL CITRATE (PF) 100 MCG/2ML IJ SOLN: 100.0000 ug | Freq: Once | INTRAMUSCULAR | Status: AC

## 2020-08-22 MED ORDER — ASENAPINE MALEATE 5 MG SL SUBL
10.0000 mg | SUBLINGUAL_TABLET | Freq: Every day | SUBLINGUAL | Status: DC
  Administered 2020-08-22 – 2020-08-24 (×3): 10 mg via SUBLINGUAL
  Filled 2020-08-22 (×5): qty 2

## 2020-08-22 MED ORDER — ACARBOSE 25 MG PO TABS
50.0000 mg | ORAL_TABLET | Freq: Three times a day (TID) | ORAL | Status: DC
  Administered 2020-08-22 – 2020-08-25 (×9): 50 mg via ORAL
  Filled 2020-08-22 (×10): qty 2

## 2020-08-22 MED ORDER — BUPIVACAINE-EPINEPHRINE 0.25% -1:200000 IJ SOLN
INTRAMUSCULAR | Status: DC | PRN
  Administered 2020-08-22: 20 mL

## 2020-08-22 MED ORDER — DICYCLOMINE HCL 10 MG PO CAPS
10.0000 mg | ORAL_CAPSULE | Freq: Three times a day (TID) | ORAL | Status: DC
  Administered 2020-08-22 – 2020-08-25 (×8): 10 mg via ORAL
  Filled 2020-08-22 (×8): qty 1

## 2020-08-22 MED ORDER — OXYCODONE HCL ER 10 MG PO T12A: 10.0000 mg | EXTENDED_RELEASE_TABLET | Freq: Two times a day (BID) | ORAL | Status: DC

## 2020-08-22 MED ORDER — CHLORHEXIDINE GLUCONATE 0.12 % MT SOLN: 15.0000 mL | Freq: Once | OROMUCOSAL | Status: DC

## 2020-08-22 MED ORDER — ACETAMINOPHEN 10 MG/ML IV SOLN
INTRAVENOUS | Status: DC | PRN
  Administered 2020-08-22: 1000 mg via INTRAVENOUS

## 2020-08-22 MED ORDER — METHOCARBAMOL 500 MG PO TABS
500.0000 mg | ORAL_TABLET | Freq: Four times a day (QID) | ORAL | Status: DC | PRN
  Administered 2020-08-22 – 2020-08-25 (×6): 500 mg via ORAL
  Filled 2020-08-22 (×7): qty 1

## 2020-08-22 MED ORDER — ACARBOSE 25 MG PO TABS
50.0000 mg | ORAL_TABLET | Freq: Three times a day (TID) | ORAL | Status: DC
  Filled 2020-08-22: qty 2

## 2020-08-22 MED ORDER — DIPHENHYDRAMINE HCL 12.5 MG/5ML PO ELIX: 25.0000 mg | ORAL_SOLUTION | ORAL | Status: DC | PRN

## 2020-08-22 MED ORDER — MENTHOL 3 MG MT LOZG: 1.0000 | LOZENGE | OROMUCOSAL | Status: DC | PRN

## 2020-08-22 MED ORDER — VANCOMYCIN HCL 1000 MG IV SOLR
INTRAVENOUS | Status: AC
  Filled 2020-08-22: qty 1000

## 2020-08-22 MED ORDER — DEXTROSE 50 % IV SOLN: 25.0000 mL | Freq: Once | INTRAVENOUS | Status: AC

## 2020-08-22 MED ORDER — DEXAMETHASONE SODIUM PHOSPHATE 10 MG/ML IJ SOLN
INTRAMUSCULAR | Status: DC | PRN
  Administered 2020-08-22: 5 mg via INTRAVENOUS

## 2020-08-22 MED ORDER — PROPOFOL 10 MG/ML IV BOLUS
INTRAVENOUS | Status: AC
  Filled 2020-08-22: qty 20

## 2020-08-22 MED ORDER — SORBITOL 70 % SOLN: 30.0000 mL | Freq: Every day | Status: DC | PRN

## 2020-08-22 MED ORDER — ORAL CARE MOUTH RINSE: 15.0000 mL | Freq: Once | OROMUCOSAL | Status: AC

## 2020-08-22 MED ORDER — SODIUM CHLORIDE 0.9 % IV SOLN: INTRAVENOUS | Status: DC

## 2020-08-22 MED ORDER — DEXAMETHASONE SODIUM PHOSPHATE 10 MG/ML IJ SOLN
10.0000 mg | Freq: Once | INTRAMUSCULAR | Status: AC
  Administered 2020-08-23: 10 mg via INTRAVENOUS
  Filled 2020-08-22: qty 1

## 2020-08-22 MED ORDER — TRANEXAMIC ACID-NACL 1000-0.7 MG/100ML-% IV SOLN
1000.0000 mg | INTRAVENOUS | Status: AC
  Administered 2020-08-22: 1000 mg via INTRAVENOUS
  Filled 2020-08-22: qty 100

## 2020-08-22 MED ORDER — TRANEXAMIC ACID 1000 MG/10ML IV SOLN
INTRAVENOUS | Status: DC | PRN
  Administered 2020-08-22: 2000 mg via TOPICAL

## 2020-08-22 MED ORDER — EPINEPHRINE PF 1 MG/ML IJ SOLN
INTRAMUSCULAR | Status: AC
  Filled 2020-08-22: qty 1

## 2020-08-22 MED ORDER — MIDAZOLAM HCL 2 MG/2ML IJ SOLN
INTRAMUSCULAR | Status: AC
  Administered 2020-08-22: 2 mg via INTRAVENOUS
  Filled 2020-08-22: qty 2

## 2020-08-22 MED ORDER — ORAL CARE MOUTH RINSE: 15.0000 mL | Freq: Once | OROMUCOSAL | Status: DC

## 2020-08-22 MED ORDER — METHOCARBAMOL 1000 MG/10ML IJ SOLN
500.0000 mg | Freq: Four times a day (QID) | INTRAVENOUS | Status: DC | PRN
  Filled 2020-08-22: qty 5

## 2020-08-22 MED ORDER — 0.9 % SODIUM CHLORIDE (POUR BTL) OPTIME
TOPICAL | Status: DC | PRN
  Administered 2020-08-22: 1000 mL

## 2020-08-22 MED ORDER — KETAMINE HCL 50 MG/5ML IJ SOSY
PREFILLED_SYRINGE | INTRAMUSCULAR | Status: AC
  Filled 2020-08-22: qty 5

## 2020-08-22 MED ORDER — KETAMINE HCL 10 MG/ML IJ SOLN
INTRAMUSCULAR | Status: DC | PRN
  Administered 2020-08-22: 30 mg via INTRAVENOUS

## 2020-08-22 MED ORDER — SODIUM CHLORIDE 0.9 % IR SOLN
Status: DC | PRN
  Administered 2020-08-22: 3000 mL

## 2020-08-22 MED ORDER — HYDROMORPHONE HCL 1 MG/ML IJ SOLN
0.2500 mg | INTRAMUSCULAR | Status: DC | PRN
  Administered 2020-08-22: 0.5 mg via INTRAVENOUS

## 2020-08-22 MED ORDER — PROPOFOL 10 MG/ML IV BOLUS
INTRAVENOUS | Status: DC | PRN
  Administered 2020-08-22: 40 mg via INTRAVENOUS
  Administered 2020-08-22: 160 mg via INTRAVENOUS

## 2020-08-22 MED ORDER — IRRISEPT - 450ML BOTTLE WITH 0.05% CHG IN STERILE WATER, USP 99.95% OPTIME
TOPICAL | Status: DC | PRN
  Administered 2020-08-22: 450 mL

## 2020-08-22 MED ORDER — HYDROXYCHLOROQUINE SULFATE 200 MG PO TABS
200.0000 mg | ORAL_TABLET | Freq: Every day | ORAL | Status: DC
  Administered 2020-08-23 – 2020-08-25 (×3): 200 mg via ORAL
  Filled 2020-08-22 (×3): qty 1

## 2020-08-22 MED ORDER — CHLORHEXIDINE GLUCONATE 0.12 % MT SOLN
15.0000 mL | Freq: Once | OROMUCOSAL | Status: AC
  Administered 2020-08-22: 15 mL via OROMUCOSAL
  Filled 2020-08-22: qty 15

## 2020-08-22 MED ORDER — MIDAZOLAM HCL 2 MG/2ML IJ SOLN
INTRAMUSCULAR | Status: AC
  Filled 2020-08-22: qty 2

## 2020-08-22 MED ORDER — FLUTICASONE PROPIONATE 50 MCG/ACT NA SUSP
2.0000 | Freq: Two times a day (BID) | NASAL | Status: DC
  Administered 2020-08-22 – 2020-08-25 (×6): 2 via NASAL
  Filled 2020-08-22 (×2): qty 16

## 2020-08-22 MED ORDER — LINACLOTIDE 145 MCG PO CAPS
290.0000 ug | ORAL_CAPSULE | Freq: Every day | ORAL | Status: DC | PRN
  Filled 2020-08-22: qty 2

## 2020-08-22 MED ORDER — BUPRENORPHINE 15 MCG/HR TD PTWK: 1.0000 | MEDICATED_PATCH | TRANSDERMAL | Status: DC

## 2020-08-22 MED ORDER — LAMOTRIGINE 100 MG PO TABS
200.0000 mg | ORAL_TABLET | Freq: Two times a day (BID) | ORAL | Status: DC
  Administered 2020-08-22 – 2020-08-25 (×6): 200 mg via ORAL
  Filled 2020-08-22 (×6): qty 2

## 2020-08-22 MED ORDER — SUGAMMADEX SODIUM 200 MG/2ML IV SOLN
INTRAVENOUS | Status: DC | PRN
  Administered 2020-08-22: 200 mg via INTRAVENOUS

## 2020-08-22 MED ORDER — CLONIDINE HCL (ANALGESIA) 100 MCG/ML EP SOLN
EPIDURAL | Status: DC | PRN
  Administered 2020-08-22: 100 ug

## 2020-08-22 MED ORDER — FENTANYL CITRATE (PF) 250 MCG/5ML IJ SOLN
INTRAMUSCULAR | Status: AC
  Filled 2020-08-22: qty 5

## 2020-08-22 MED ORDER — SODIUM CHLORIDE 0.9 % IV SOLN: INTRAVENOUS | Status: DC | PRN

## 2020-08-22 MED ORDER — OXYCODONE HCL 5 MG PO TABS: 5.0000 mg | ORAL_TABLET | ORAL | Status: DC | PRN

## 2020-08-22 MED ORDER — FENTANYL CITRATE (PF) 100 MCG/2ML IJ SOLN
INTRAMUSCULAR | Status: AC
  Filled 2020-08-22: qty 2

## 2020-08-22 SURGICAL SUPPLY — 78 items
ALCOHOL 70% 16 OZ (MISCELLANEOUS) ×3 IMPLANT
BAG DECANTER FOR FLEXI CONT (MISCELLANEOUS) ×3 IMPLANT
BANDAGE ESMARK 6X9 LF (GAUZE/BANDAGES/DRESSINGS) IMPLANT
BLADE SAG 18X100X1.27 (BLADE) ×3 IMPLANT
BNDG ESMARK 6X9 LF (GAUZE/BANDAGES/DRESSINGS)
BOWL SMART MIX CTS (DISPOSABLE) ×3 IMPLANT
CEMENT BONE REFOBACIN R1X40 US (Cement) ×6 IMPLANT
CLOSURE STERI-STRIP 1/2X4 (GAUZE/BANDAGES/DRESSINGS) ×2
CLSR STERI-STRIP ANTIMIC 1/2X4 (GAUZE/BANDAGES/DRESSINGS) ×4 IMPLANT
COOLER ICEMAN CLASSIC (MISCELLANEOUS) ×3 IMPLANT
COVER SURGICAL LIGHT HANDLE (MISCELLANEOUS) ×3 IMPLANT
COVER WAND RF STERILE (DRAPES) IMPLANT
CUFF TOURN SGL QUICK 34 (TOURNIQUET CUFF) ×2
CUFF TRNQT CYL 34X4.125X (TOURNIQUET CUFF) ×1 IMPLANT
DERMABOND ADHESIVE PROPEN (GAUZE/BANDAGES/DRESSINGS) ×2
DERMABOND ADVANCED (GAUZE/BANDAGES/DRESSINGS) ×2
DERMABOND ADVANCED .7 DNX12 (GAUZE/BANDAGES/DRESSINGS) ×1 IMPLANT
DERMABOND ADVANCED .7 DNX6 (GAUZE/BANDAGES/DRESSINGS) ×1 IMPLANT
DRAPE EXTREMITY T 121X128X90 (DISPOSABLE) ×3 IMPLANT
DRAPE HALF SHEET 40X57 (DRAPES) ×3 IMPLANT
DRAPE INCISE IOBAN 66X45 STRL (DRAPES) IMPLANT
DRAPE ORTHO SPLIT 77X108 STRL (DRAPES) ×4
DRAPE POUCH INSTRU U-SHP 10X18 (DRAPES) ×3 IMPLANT
DRAPE SURG ORHT 6 SPLT 77X108 (DRAPES) ×2 IMPLANT
DRAPE U-SHAPE 47X51 STRL (DRAPES) ×6 IMPLANT
DRSG AQUACEL AG ADV 3.5X10 (GAUZE/BANDAGES/DRESSINGS) ×3 IMPLANT
DURAPREP 26ML APPLICATOR (WOUND CARE) ×9 IMPLANT
ELECT CAUTERY BLADE 6.4 (BLADE) ×3 IMPLANT
ELECT REM PT RETURN 9FT ADLT (ELECTROSURGICAL) ×3
ELECTRODE REM PT RTRN 9FT ADLT (ELECTROSURGICAL) ×1 IMPLANT
FEMUR CEMT CR PERS STD SZ 5 RT (Knees) ×3 IMPLANT
GLOVE BIOGEL PI IND STRL 7.0 (GLOVE) ×1 IMPLANT
GLOVE BIOGEL PI INDICATOR 7.0 (GLOVE) ×2
GLOVE ECLIPSE 7.0 STRL STRAW (GLOVE) ×9 IMPLANT
GLOVE SKINSENSE NS SZ7.5 (GLOVE) ×6
GLOVE SKINSENSE STRL SZ7.5 (GLOVE) ×3 IMPLANT
GLOVE SURG SYN 7.5  E (GLOVE) ×8
GLOVE SURG SYN 7.5 E (GLOVE) ×4 IMPLANT
GOWN STRL REIN XL XLG (GOWN DISPOSABLE) ×3 IMPLANT
GOWN STRL REUS W/ TWL LRG LVL3 (GOWN DISPOSABLE) ×1 IMPLANT
GOWN STRL REUS W/TWL LRG LVL3 (GOWN DISPOSABLE) ×2
HANDPIECE INTERPULSE COAX TIP (DISPOSABLE) ×2
HDLS TROCR DRIL PIN KNEE 75 (PIN) ×2
HOOD PEEL AWAY FLYTE STAYCOOL (MISCELLANEOUS) ×6 IMPLANT
INSERT TIB AS PERS 4-5X12 RT (Insert) ×3 IMPLANT
JET LAVAGE IRRISEPT WOUND (IRRIGATION / IRRIGATOR) ×3
KIT BASIN OR (CUSTOM PROCEDURE TRAY) ×3 IMPLANT
KIT TURNOVER KIT B (KITS) ×3 IMPLANT
LAVAGE JET IRRISEPT WOUND (IRRIGATION / IRRIGATOR) ×1 IMPLANT
MANIFOLD NEPTUNE II (INSTRUMENTS) ×3 IMPLANT
MARKER SKIN DUAL TIP RULER LAB (MISCELLANEOUS) ×3 IMPLANT
NEEDLE SPNL 18GX3.5 QUINCKE PK (NEEDLE) ×6 IMPLANT
NS IRRIG 1000ML POUR BTL (IV SOLUTION) ×3 IMPLANT
PACK TOTAL JOINT (CUSTOM PROCEDURE TRAY) ×3 IMPLANT
PAD ARMBOARD 7.5X6 YLW CONV (MISCELLANEOUS) ×6 IMPLANT
PAD COLD SHLDR WRAP-ON (PAD) ×3 IMPLANT
PIN DRILL HDLS TROCAR 75 4PK (PIN) ×1 IMPLANT
SAW OSC TIP CART 19.5X105X1.3 (SAW) ×3 IMPLANT
SET HNDPC FAN SPRY TIP SCT (DISPOSABLE) ×1 IMPLANT
STAPLER VISISTAT 35W (STAPLE) IMPLANT
STEM POLY PAT PLY 35M KNEE (Knees) ×3 IMPLANT
STEM TIBIA 5 DEG SZ E R KNEE (Knees) ×1 IMPLANT
SUCTION FRAZIER HANDLE 10FR (MISCELLANEOUS) ×2
SUCTION TUBE FRAZIER 10FR DISP (MISCELLANEOUS) ×1 IMPLANT
SUT ETHILON 2 0 FS 18 (SUTURE) IMPLANT
SUT MNCRL AB 4-0 PS2 18 (SUTURE) IMPLANT
SUT VIC AB 0 CT1 27 (SUTURE) ×4
SUT VIC AB 0 CT1 27XBRD ANBCTR (SUTURE) ×2 IMPLANT
SUT VIC AB 1 CTX 27 (SUTURE) ×9 IMPLANT
SUT VIC AB 2-0 CT1 27 (SUTURE) ×8
SUT VIC AB 2-0 CT1 TAPERPNT 27 (SUTURE) ×4 IMPLANT
SYR 50ML LL SCALE MARK (SYRINGE) ×6 IMPLANT
TIBIA STEM 5 DEG SZ E R KNEE (Knees) ×3 IMPLANT
TOWEL GREEN STERILE (TOWEL DISPOSABLE) ×3 IMPLANT
TOWEL GREEN STERILE FF (TOWEL DISPOSABLE) ×3 IMPLANT
TRAY CATH 16FR W/PLASTIC CATH (SET/KITS/TRAYS/PACK) IMPLANT
UNDERPAD 30X36 HEAVY ABSORB (UNDERPADS AND DIAPERS) ×3 IMPLANT
WRAP KNEE MAXI GEL POST OP (GAUZE/BANDAGES/DRESSINGS) ×3 IMPLANT

## 2020-08-22 NOTE — Discharge Instructions (Signed)

## 2020-08-22 NOTE — Transfer of Care (Signed)
Immediate Anesthesia Transfer of Care Note  Patient: Kimberly Herring  Procedure(s) Performed: RIGHT TOTAL KNEE ARTHROPLASTY (Right Knee)  Patient Location: PACU  Anesthesia Type:General  Level of Consciousness: awake, alert  and oriented  Airway & Oxygen Therapy: Patient Spontanous Breathing  Post-op Assessment: Report given to RN and Post -op Vital signs reviewed and stable  Post vital signs: Reviewed and stable  Last Vitals:  Vitals Value Taken Time  BP 125/59 08/22/20 1536  Temp    Pulse 87 08/22/20 1537  Resp 18 08/22/20 1537  SpO2 97 % 08/22/20 1537  Vitals shown include unvalidated device data.  Last Pain:  Vitals:   08/22/20 1215  TempSrc:   PainSc: 0-No pain      Patients Stated Pain Goal: 2 (55/37/48 2707)  Complications: No complications documented.

## 2020-08-22 NOTE — H&P (Signed)
PREOPERATIVE H&P  Chief Complaint: right knee degenerative joint disesae  HPI: Kimberly Herring is a 54 y.o. female who presents for surgical treatment of right knee degenerative joint disesae.  She denies any changes in medical history.  Past Medical History:  Diagnosis Date  . Anemia   . Anxiety   . Arthritis   . Asthma   . Bipolar 2 disorder ([place])   . Complication of anesthesia    hard to put to sleep  . Depression   . Family history of adverse reaction to anesthesia    Mother is hard to wake up  . Fibromyalgia   . Hypertension   . Hypoglycemia following gastrointestinal surgery    post-bariatric surgery hypoglycemia (followed at [facility])  . Lung nodules    lung nodules ~1999; diagnosed wtih "Legionnaires", reported follow-up imaging showed nodules had resolved but some "scar tissue" present  . Migraine aura, persistent   . Platelet storage pool disease [facility])    with history of abnormal post-operative bleeding. (Hematologist Dr. Sterling Big, MD at [facility])  . Pneumonia   . Undifferentiated connective tissue disease [facility])    [facility]   Past Surgical History:  Procedure Laterality Date  . ABDOMINAL HYSTERECTOMY    . CESAREAN SECTION    . ESOPHAGOGASTRODUODENOSCOPY    . FOOT SURGERY Bilateral   . GASTRIC BYPASS    . HERNIA REPAIR    . HIP SURGERY    . KNEE SURGERY Bilateral   . LAPAROSCOPIC OOPHERECTOMY    . SHOULDER SURGERY Bilateral   . TONSILLECTOMY    . TUBAL LIGATION     Social History   Socioeconomic History  . Marital status: Divorced    Spouse name: Not on file  . Number of children: Not on file  . Years of education: Not on file  . Highest education level: Not on file  Occupational History  . Not on file  Tobacco Use  . Smoking status: Never Smoker  . Smokeless tobacco: Never Used  Vaping Use  . Vaping Use: Never used  Substance and Sexual Activity  . Alcohol use: Yes    Comment: socially  . Drug use: No  . Sexual activity: Not on file    Other Topics Concern  . Not on file  Social History Narrative  . Not on file   Social Determinants of Health   Financial Resource Strain:   . Difficulty of Paying Living Expenses: Not on file  Food Insecurity:   . Worried About Charity fundraiser in the Last Year: Not on file  . Ran Out of Food in the Last Year: Not on file  Transportation Needs:   . Lack of Transportation (Medical): Not on file  . Lack of Transportation (Non-Medical): Not on file  Physical Activity:   . Days of Exercise per Week: Not on file  . Minutes of Exercise per Session: Not on file  Stress:   . Feeling of Stress : Not on file  Social Connections:   . Frequency of Communication with Friends and Family: Not on file  . Frequency of Social Gatherings with Friends and Family: Not on file  . Attends Religious Services: Not on file  . Active Member of Clubs or Organizations: Not on file  . Attends Archivist Meetings: Not on file  . Marital Status: Not on file   No family history on file. Allergies  Allergen Reactions  . Bactrim [Sulfamethoxazole-Trimethoprim] Anaphylaxis  . Ambien [Zolpidem] Other (  See Comments)    Sleep Walking  . Depakene [Valproic Acid] Other (See Comments)    Foot Ulcers  . Flagyl [Metronidazole] Nausea And Vomiting  . Lyrica [Pregabalin] Swelling  . Nickel Hives  . Nsaids     Platelet dysfunction  . Percocet [Oxycodone-Acetaminophen] Hives  . Compazine [Prochlorperazine] Anxiety  . Contrave [Naltrexone-Bupropion Hcl Er] Swelling and Rash   Prior to Admission medications   Medication Sig Start Date End Date Taking? Authorizing Provider  acetaminophen (TYLENOL) 500 MG tablet Take 1,000 mg by mouth 3 (three) times daily as needed for moderate pain.   Yes [provider]  albuterol (PROVENTIL HFA;VENTOLIN HFA) 108 (90 BASE) MCG/ACT inhaler Inhale 2 puffs into the lungs every 6 (six) hours as needed for wheezing or shortness of breath.   Yes [provider]  Asenapine Maleate (SAPHRIS) 10 MG SUBL Place 10 mg under the tongue at bedtime.   Yes [provider]  buprenorphine [place]) 15 MCG/HR Place 1 patch onto the skin every Sunday.    Yes [provider]  Calcium Carb-Cholecalciferol (CALCIUM 500 + D PO) Take 1 tablet by mouth daily.   Yes [provider]  cetirizine (ZYRTEC) 10 MG tablet Take 10 mg by mouth at bedtime.    Yes [provider]  desipramine (NORPRAMIN) 50 MG tablet Take 50 mg by mouth at bedtime.    Yes [provider]  diclofenac Sodium (VOLTAREN) 1 % GEL Apply 1 application topically 2 (two) times daily as needed (pain).   Yes [provider]  dicyclomine (BENTYL) 10 MG capsule Take 10 mg by mouth 3 (three) times daily.    Yes [provider]  DULoxetine (CYMBALTA) 60 MG capsule Take 120 mg by mouth at bedtime.    Yes [provider]  fluticasone (FLONASE) 50 MCG/ACT nasal spray Place 1 spray into both nostrils 2 (two) times daily. Patient taking differently: Place 2 sprays into both nostrils 2 (two) times daily.  04/04/15  Yes Betancourt, Aura Fey, NP  fluticasone-salmeterol (ADVAIR HFA) 115-21 MCG/ACT inhaler Inhale 2 puffs into the lungs 2 (two) times daily as needed (shortness of breath).    Yes [provider]  folic acid (FOLVITE) 1 MG tablet Take 1 mg by mouth 2 (two) times daily.    Yes [provider]  gabapentin (NEURONTIN) 800 MG tablet Take 800 mg by mouth 3 (three) times daily.   Yes [provider]  Galcanezumab-gnlm (EMGALITY) 120 MG/ML SOSY Inject 120 mg into the skin every 28 (twenty-eight) days.   Yes [provider]  Glucagon 3 MG/DOSE POWD Place 1 spray into the nose as needed (hypoglycemia).   Yes [provider]  hydroxychloroquine (PLAQUENIL) 200 MG tablet Take 200 mg by mouth daily.    Yes [provider]  hydrOXYzine (ATARAX/VISTARIL) 25 MG tablet Take 25 mg by mouth 3  (three) times daily as needed (migraines/headaches).    Yes [provider]  lamoTRIgine (LAMICTAL) 200 MG tablet Take 200 mg by mouth 2 (two) times daily. 06/01/20  Yes [provider]  latanoprost (XALATAN) 0.005 % ophthalmic solution Place 1 drop into both eyes at bedtime.    Yes [provider]  linaclotide (LINZESS) 290 MCG CAPS capsule Take 290 mcg by mouth daily as needed (constipation).    Yes [provider]  Methotrexate 25 MG/ML SOSY Inject 15 mg into the skin every Sunday.   Yes [provider]  Multiple Vitamin (MULTIVITAMIN) tablet Take 1 tablet  by mouth daily.   Yes [provider]  omeprazole (PRILOSEC) 40 MG capsule Take 40 mg by mouth 2 (two) times daily. 07/16/20  Yes [provider]  ondansetron (ZOFRAN-ODT) 4 MG disintegrating tablet Take 4 mg by mouth every 8 (eight) hours as needed for nausea or vomiting.   Yes [provider]  Probiotic Product (PROBIOTIC PO) Take 1 capsule by mouth daily.   Yes [provider]  tiZANidine (ZANAFLEX) 4 MG tablet Take 12 mg by mouth at bedtime.   Yes [provider]  topiramate (TOPAMAX) 25 MG tablet Take 25-100 mg by mouth See admin instructions. Take 25 mg in the morning and 100 mg at night   Yes [provider]  tranexamic acid (LYSTEDA) 650 MG TABS tablet Take 1,300 mg by mouth See admin instructions. Take 1300 mg 3 times daily start the day before procedure, taking for a total of 5 days then stop   Yes [provider]  verapamil (CALAN-SR) 120 MG CR tablet Take 120 mg by mouth 2 (two) times daily.   Yes [provider]  acarbose (PRECOSE) 25 MG tablet Take 50 mg by mouth 3 (three) times daily with meals. 06/07/20   [provider]  docusate sodium (COLACE) 100 MG capsule Take 1 capsule (100 mg total) by mouth daily as needed. 08/17/20 08/17/21  Aundra Dubin, PA-C  HYDROcodone-acetaminophen (NORCO) 7.5-325 MG tablet  Take 1-2 tablets by mouth every 6 (six) hours as needed for moderate pain. To be taken after surgery 08/17/20   Aundra Dubin, PA-C  ondansetron (ZOFRAN) 4 MG tablet Take 1 tablet (4 mg total) by mouth every 8 (eight) hours as needed for nausea or vomiting. To be taken after surgery 08/17/20   Aundra Dubin, PA-C  potassium chloride (KLOR-CON) 10 MEQ tablet Take 4 pills twice daily x 4 days before surgery 08/12/20   Aundra Dubin, PA-C     Positive ROS: All other systems have been reviewed and were otherwise negative with the exception of those mentioned in the HPI and as above.  Physical Exam: General: Alert, no acute distress Cardiovascular: No pedal edema Respiratory: No cyanosis, no use of accessory musculature GI: abdomen soft Skin: No lesions in the area of chief complaint Neurologic: Sensation intact distally Psychiatric: Patient is competent for consent with normal mood and affect Lymphatic: no lymphedema  MUSCULOSKELETAL: exam stable  Assessment: right knee degenerative joint disesae  Plan: Plan for Procedure(s): RIGHT TOTAL KNEE ARTHROPLASTY  The risks benefits and alternatives were discussed with the patient including but not limited to the risks of nonoperative treatment, versus surgical intervention including infection, bleeding, nerve injury,  blood clots, cardiopulmonary complications, morbidity, mortality, among others, and they were willing to proceed.   Preoperative templating of the joint replacement has been completed, documented, and submitted to the Operating Room personnel in order to optimize intra-operative equipment management.   Eduard Roux, MD 08/22/2020 9:22 AM

## 2020-08-22 NOTE — Progress Notes (Signed)
Orthopedic Tech Progress Note Patient Details:  Kimberly Herring 01/25/1966 [id number]  CPM Right Knee CPM Right Knee: On Right Knee Flexion (Degrees): 60 Right Knee Extension (Degrees): 0  Post Interventions Patient Tolerated: Well Instructions Provided: [facility] [place] 08/22/2020, 4:37 PM

## 2020-08-22 NOTE — Anesthesia Procedure Notes (Signed)
Procedure Name: Intubation Date/Time: 08/22/2020 1:18 PM Performed by: Trinna Post., CRNA Pre-anesthesia Checklist: Emergency Drugs available, Patient identified, Suction available, Patient being monitored and Timeout performed Patient Re-evaluated:Patient Re-evaluated prior to induction Oxygen Delivery Method: Circle system utilized Preoxygenation: Pre-oxygenation with 100% oxygen Induction Type: IV induction Ventilation: Mask ventilation without difficulty Laryngoscope Size: Mac and 3 Grade View: Grade I Tube type: Oral Tube size: 7.0 mm Number of attempts: 1 Airway Equipment and Method: Stylet Placement Confirmation: ETT inserted through vocal cords under direct vision,  positive ETCO2 and breath sounds checked- equal and bilateral Secured at: 22 cm Tube secured with: Tape Dental Injury: Teeth and Oropharynx as per pre-operative assessment

## 2020-08-22 NOTE — Anesthesia Procedure Notes (Addendum)
Anesthesia Regional Block: Adductor canal block   Pre-Anesthetic Checklist: ,, timeout performed, Correct Patient, Correct Site, Correct Laterality, Correct Procedure, Correct Position, site marked, Risks and benefits discussed,  Surgical consent,  Pre-op evaluation,  At surgeon's request and post-op pain management  Laterality: Lower and Right  Prep: chloraprep       Needles:  Injection technique: Single-shot  Needle Type: Echogenic Needle     Needle Length: 9cm  Needle Gauge: 22     Additional Needles:   Procedures:,,,, ultrasound used (permanent image in chart),,,,  Narrative:  Start time: 08/22/2020 11:53 AM End time: 08/22/2020 11:59 AM Injection made incrementally with aspirations every 5 mL.  Performed by: Personally  Anesthesiologist: Barnet Glasgow, MD  Additional Notes: Block assessed prior to surgery. Pt tolerated procedure well.

## 2020-08-22 NOTE — Progress Notes (Signed)
Hypoglycemic Event  CBG: 61  Treatment: D50 25 mL (12.5 gm)  Symptoms: None  Follow-up CBG: [id number] CBG Result:86  Possible Reasons for Event: history of hypoglycemia and NPO for surgery  Comments/MD notified: Dr. Valma Cava aware    Kimberly Herring

## 2020-08-22 NOTE — Progress Notes (Signed)
Patient arrived to [facility] 09 from the PACU, report received from PACU nurse. Patient alert and oriented x4. CPG to rt leg until 2030 per report. Ice pack to rt knee. Dorsal Pedis pulse to rt foot palpated. Toes with Less than 3 sec capillary refill. Patient oriented to room.  Patient informed of pain medications available.

## 2020-08-22 NOTE — Op Note (Signed)
Total Knee Arthroplasty Procedure Note  Preoperative diagnosis: Right knee osteoarthritis  Postoperative diagnosis:same  Operative procedure: Right total knee arthroplasty. CPT [phone]  Surgeon: N. Eduard Roux, MD  Assist: Madalyn Rob, PA-C; necessary for the timely completion of procedure and due to complexity of procedure.  Anesthesia: Spinal, regional, local  Tourniquet time: see anesthesia record  Implants used: Zimmer persona nickel free Femur: CR 5 Tibia: E Patella: 35 mm Polyethylene: 12 mm, MC  Indication: Kimberly Herring is a 54 y.o. year old female with a history of knee pain. Having failed conservative management, the patient elected to proceed with a total knee arthroplasty.  We have reviewed the risk and benefits of the surgery and they elected to proceed after voicing understanding.  Procedure:  After informed consent was obtained and understanding of the risk were voiced including but not limited to bleeding, infection, damage to surrounding structures including nerves and vessels, blood clots, leg length inequality and the failure to achieve desired results, the operative extremity was marked with verbal confirmation of the patient in the holding area.   The patient was then brought to the operating room and transported to the operating room table in the supine position.  A tourniquet was applied to the operative extremity around the upper thigh. The operative limb was then prepped and draped in the usual sterile fashion and preoperative antibiotics were administered.  A time out was performed prior to the start of surgery confirming the correct extremity, preoperative antibiotic administration, as well as team members, implants and instruments available for the case. Correct surgical site was also confirmed with preoperative radiographs. The limb was then elevated for exsanguination and the tourniquet was inflated. A midline incision was made and a standard medial  parapatellar approach was performed.  The infrapatellar fat pad was removed.  Suprapatellar synovium was removed to reveal the anterior distal femoral cortex.  A medial peel was performed to release the capsule of the medial tibial plateau.  The patella was then everted and was prepared and sized to a 35 mm.  A cover was placed on the patella for protection from retractors.  The knee was then brought into full flexion and we then turned our attention to the femur.  The cruciates were sacrificed.  Start site was drilled in the femur and the intramedullary distal femoral cutting guide was placed, set at 5 degrees valgus, taking 10 mm of distal resection. The distal cut was made. Osteophytes were then removed.  Next, the proximal tibial cutting guide was placed with appropriate slope, varus/valgus alignment and depth of resection. The proximal tibial cut was made. Gap blocks were then used to assess the extension gap and alignment, and appropriate soft tissue releases were performed. Attention was turned back to the femur, which was sized using the sizing guide to a size 5. Appropriate rotation of the femoral component was determined using epicondylar axis, Whiteside's line, and assessing the flexion gap under ligament tension. The appropriate size 4-in-1 cutting block was placed and checked with an angel wing and cuts were made. Posterior femoral osteophytes and uncapped bone were then removed with the curved osteotome.  Trial components were placed, and stability was checked in full extension, mid-flexion, and deep flexion. Proper tibial rotation was determined and marked.  The patella tracked well without a lateral release.  The femoral lugs were then drilled. Trial components were then removed and tibial preparation performed.  The tibia was sized for a size E component.  A posterior  capsular injection comprising of 20 cc of 1.3% exparel, 20 cc of 0.25% bupivicaine with epi and 20 cc of normal saline was  performed for postoperative pain control. The bony surfaces were irrigated with a pulse lavage and then dried. Bone cement was vacuum mixed on the back table, and the final components sized above were cemented into place.  Antibiotic irrigation was placed in the knee joint and soft tissues while the cement cured.  After cement had finished curing, excess cement was removed. The stability of the construct was re-evaluated throughout a range of motion and found to be acceptable. The trial liner was removed, the knee was copiously irrigated, and the knee was re-evaluated for any excess bone debris. The real polyethylene liner, 12 mm thick, was inserted and checked to ensure the locking mechanism had engaged appropriately. The tourniquet was deflated and hemostasis was achieved. The wound was irrigated with normal saline.  One gram of vancomycin powder was placed in the surgical bed.  Capsular closure was performed with a #1 vicryl, subcutaneous fat closed with a 0 vicryl suture, then subcutaneous tissue closed with interrupted 2.0 vicryl suture. The skin was then closed with a 2.0 nylon and dermabond. A sterile dressing was applied.  The patient was awakened in the operating room and taken to recovery in stable condition. All sponge, needle, and instrument counts were correct at the end of the case.  Position: supine  Complications: none.  Time Out: performed   Drains/Packing: none  Estimated blood loss: minimal  Returned to Recovery Room: in good condition.   Antibiotics: yes   Mechanical VTE (DVT) Prophylaxis: sequential compression devices, TED thigh-high  Chemical VTE (DVT) Prophylaxis: none, patient has platelet disorder and is at high risk of post surgical bleeding  Fluid Replacement  Crystalloid: see anesthesia record Blood: none  FFP: none   Specimens Removed: 1 to pathology   Sponge and Instrument Count Correct? yes   PACU: portable radiograph - knee AP and Lateral   Plan/RTC:  Return in 2 weeks for wound check.   Weight Bearing/Load Lower Extremity: full   N. Eduard Roux, MD [facility] 2:55 PM

## 2020-08-23 ENCOUNTER — Encounter (HOSPITAL_COMMUNITY): Payer: Self-pay | Admitting: Orthopaedic Surgery

## 2020-08-23 ENCOUNTER — Other Ambulatory Visit: Payer: Self-pay | Admitting: Physician Assistant

## 2020-08-23 LAB — CBC
HCT: 34.4 % — ABNORMAL LOW (ref 36.0–46.0)
Hemoglobin: 10.6 g/dL — ABNORMAL LOW (ref 12.0–15.0)
MCH: 27 pg (ref 26.0–34.0)
MCHC: 30.8 g/dL (ref 30.0–36.0)
MCV: 87.5 fL (ref 80.0–100.0)
Platelets: 198 10*3/uL (ref 150–400)
RBC: 3.93 MIL/uL (ref 3.87–5.11)
RDW: 14.7 % (ref 11.5–15.5)
WBC: 7.7 10*3/uL (ref 4.0–10.5)
nRBC: 0 % (ref 0.0–0.2)

## 2020-08-23 LAB — BASIC METABOLIC PANEL
Anion gap: 7 (ref 5–15)
BUN: 9 mg/dL (ref 6–20)
CO2: 21 mmol/L — ABNORMAL LOW (ref 22–32)
Calcium: 8.2 mg/dL — ABNORMAL LOW (ref 8.9–10.3)
Chloride: 102 mmol/L (ref 98–111)
Creatinine, Ser: 0.84 mg/dL (ref 0.44–1.00)
GFR, Estimated: >60 mL/min (ref >60)
Glucose, Bld: 132 mg/dL — ABNORMAL HIGH (ref 70–99)
Potassium: 3.9 mmol/L (ref 3.5–5.1)
Sodium: 130 mmol/L — ABNORMAL LOW (ref 135–145)

## 2020-08-23 LAB — PREPARE PLATELET PHERESIS: Unit division: 0

## 2020-08-23 LAB — BPAM PLATELET PHERESIS
Blood Product Expiration Date: 202111292359
ISSUE DATE / TIME: 202111290959
Unit Type and Rh: 6200

## 2020-08-23 MED ORDER — HYDROCODONE-ACETAMINOPHEN 7.5-325 MG PO TABS
1.0000 | ORAL_TABLET | Freq: Four times a day (QID) | ORAL | 0 refills | Status: DC | PRN
Start: 2020-08-23 — End: 2020-08-23

## 2020-08-23 MED ORDER — CEPHALEXIN 500 MG PO CAPS
500.0000 mg | ORAL_CAPSULE | Freq: Four times a day (QID) | ORAL | Status: DC
  Administered 2020-08-23 – 2020-08-25 (×8): 500 mg via ORAL
  Filled 2020-08-23 (×8): qty 1

## 2020-08-23 MED ORDER — DOCUSATE SODIUM 100 MG PO CAPS: 100.0000 mg | ORAL_CAPSULE | Freq: Every day | ORAL | 2 refills | Status: AC | PRN

## 2020-08-23 MED ORDER — ONDANSETRON HCL 4 MG PO TABS: 4.0000 mg | ORAL_TABLET | Freq: Three times a day (TID) | ORAL | 0 refills | Status: AC | PRN

## 2020-08-23 MED ORDER — HYDROCODONE-ACETAMINOPHEN 7.5-325 MG PO TABS
1.0000 | ORAL_TABLET | Freq: Four times a day (QID) | ORAL | 0 refills | Status: AC | PRN
Start: 2020-08-23 — End: ?

## 2020-08-23 NOTE — Progress Notes (Signed)
Subjective: 1 Day Post-Op Procedure(s) (LRB): RIGHT TOTAL KNEE ARTHROPLASTY (Right) Patient reports pain as moderate.    Objective: Vital signs in last 24 hours: Temp:  [97 F (36.1 C)-98.7 F (37.1 C)] 98.5 F (36.9 C) (11/30 0501) Pulse Rate:  [79-100] 89 (11/30 0501) Resp:  [14-28] 18 (11/30 0501) BP: (98-137)/(48-84) 116/72 (11/30 0501) SpO2:  [90 %-100 %] 100 % (11/30 0501) Weight:  [113.4 kg] 113.4 kg (11/29 0853)  Intake/Output from previous day: 11/29 0701 - 11/30 0700 In: 2775 [P.O.:300; I.V.:2000; Blood:375; IV Piggyback:100] Out: 1150 [Urine:1100; Blood:50] Intake/Output this shift: No intake/output data recorded.  Recent Labs    08/23/20 0252  HGB 10.6*   Recent Labs    08/23/20 0252  WBC 7.7  RBC 3.93  HCT 34.4*  PLT 198   Recent Labs    08/23/20 0252  NA 130*  K 3.9  CL 102  CO2 21*  BUN 9  CREATININE 0.84  GLUCOSE 132*  CALCIUM 8.2*   No results for input(s): LABPT, INR in the last 72 hours.  Neurologically intact Neurovascular intact Sensation intact distally Intact pulses distally Dorsiflexion/Plantar flexion intact Incision: scant drainage No cellulitis present Compartment soft   Assessment/Plan: 1 Day Post-Op Procedure(s) (LRB): RIGHT TOTAL KNEE ARTHROPLASTY (Right) Advance diet Up with therapy Discharge to SNF once insurance approves and bed available WBAT RLE ABLA- mild and stable   Anticipated LOS equal to or greater than 2 midnights due to - Age 52 and older with one or more of the following:  - Obesity  - Expected need for hospital services (PT, OT, Nursing) required for safe  discharge  - Anticipated need for postoperative skilled nursing care or inpatient rehab  - Active co-morbidities: platelet storage pool disease OR   - Unanticipated findings during/Post Surgery: Slow post-op progression: GI, pain control, mobility  - Patient is a high risk of re-admission due to: Barriers to post-acute care (logistical, no  family support in home)    Aundra Dubin 08/23/2020, 7:30 AM

## 2020-08-23 NOTE — Progress Notes (Signed)
Physical Therapy Treatment Patient Details Name: Kimberly Herring MRN: [id number] DOB: 1965-10-29 Today's Date: 08/23/2020    History of Present Illness Pt is a 54 y.o. female s/p elective R TKA on 08/22/20. PMH includes HTN, fibromyalgia, arthritis, bipolar 2 disorder, depression, anxiety, bilateral knee sx, hip sx.   PT Comments    Pt progressing with mobility. Slowly improving tolerance to transfer and gait training, as well as seated RLE therex. Pt remains limited by pain, generalized weakness and decreased activity tolerance. Continue to recommend SNF-level therapies to maximize functional mobility and independence prior to return home.    Follow Up Recommendations  SNF;Supervision for mobility/OOB;Follow surgeon's recommendation for DC plan and follow-up therapies     Equipment Recommendations  None recommended by PT    Recommendations for Other Services       Precautions / Restrictions Precautions Precautions: Knee;Fall Restrictions Weight Bearing Restrictions: Yes RLE Weight Bearing: Weight bearing as tolerated    Mobility  Bed Mobility Overal bed mobility: Needs Assistance Bed Mobility: Sit to Supine       Sit to supine: Min assist   General bed mobility comments: MinA for RLE management return to supine  Transfers Overall transfer level: Needs assistance Equipment used: Rolling walker (2 wheeled) Transfers: Sit to/from Stand Sit to Stand: Min guard         General transfer comment: Able to stand from recliner and BSC (over toilet) to RW with min guard for balance, increased time and effort with heavy reliance on BUE support to push into standing; increased time for eccentric control into sitting, limited by pain  Ambulation/Gait Ambulation/Gait assistance: Min guard Gait Distance (Feet): 28 Feet Assistive device: Rolling walker (2 wheeled) Gait Pattern/deviations: Step-to pattern;Trunk flexed;Antalgic;Decreased weight shift to left Gait velocity:  Decreased   General Gait Details: Slow, antalgic gait with RW and min guard for balance; cues for increased step length and heel to toe gait pattern with RLE; improved sequencing with RW; pt declined further distance secondary to pain and fatigue   Stairs             Wheelchair Mobility    Modified Rankin (Stroke Patients Only)       Balance Overall balance assessment: Needs assistance   Sitting balance-Leahy Scale: Fair       Standing balance-Leahy Scale: Fair Standing balance comment: Can static stand at sink to wash hands without UE support; dynamic stability improved with UE support                            Cognition Arousal/Alertness: Awake/alert Behavior During Therapy: WFL for tasks assessed/performed Overall Cognitive Status: Within Functional Limits for tasks assessed                                        Exercises Total Joint Exercises Long Arc Quad: AROM;Right;Seated Knee Flexion: AAROM;Right;Seated (washcloth under foot)    General Comments General comments (skin integrity, edema, etc.): Ice machine reapplied at end of session      Pertinent Vitals/Pain Pain Assessment: Faces Faces Pain Scale: Hurts even more Pain Location: R knee Pain Descriptors / Indicators: Discomfort;Grimacing;Operative site guarding Pain Intervention(s): Premedicated before session;Ice applied    Home Living                      Prior Function  PT Goals (current goals can now be found in the care plan section) Progress towards PT goals: Progressing toward goals    Frequency    7X/week      PT Plan Current plan remains appropriate    Co-evaluation              AM-PAC PT "6 Clicks" Mobility   Outcome Measure  Help needed turning from your back to your side while in a flat bed without using bedrails?: A Little Help needed moving from lying on your back to sitting on the side of a flat bed without using  bedrails?: A Little Help needed moving to and from a bed to a chair (including a wheelchair)?: A Little Help needed standing up from a chair using your arms (e.g., wheelchair or bedside chair)?: A Little Help needed to walk in hospital room?: A Little Help needed climbing 3-5 steps with a railing? : A Lot 6 Click Score: 17    End of Session   Activity Tolerance: Patient tolerated treatment well;Patient limited by pain Patient left: in bed;with call bell/phone within reach;with bed alarm set Nurse Communication: Mobility status PT Visit Diagnosis: Other abnormalities of gait and mobility (R26.89);Pain;Muscle weakness (generalized) (M62.81) Pain - Right/Left: Right Pain - part of body: Knee     Time: 1510-1540 PT Time Calculation (min) (ACUTE ONLY): 30 min  Charges:  $Therapeutic Exercise: 8-22 mins $Therapeutic Activity: 8-22 mins                     Mabeline Caras, PT, DPT Acute Rehabilitation Services  Pager [phone] Office [place] 08/23/2020, 3:57 PM

## 2020-08-23 NOTE — TOC Initial Note (Signed)
Transition of Care [facility]) - Initial/Assessment Note    Patient Details  Name: Kimberly Herring MRN: [id number] Date of Birth: Jan 08, 1966  [facility]) CM/SW Contact:    Bethann Berkshire, [place] Phone Number: 08/23/2020, 2:52 PM  Clinical Narrative:                  CSW received consult for possible SNF placement at time of discharge. CSW spoke with patient regarding PT recommendation of SNF placement at time of discharge. Patient reported that she lives alone and does not have support there. Patient expressed understanding of PT recommendation and is agreeable to SNF placement at time of discharge. CSW discussed insurance authorization process. No further questions reported at this time. CSW to continue to follow and assist with discharge planning needs.  Fl2 complete and records faxed in hub  Expected Discharge Plan: [place] Barriers to Discharge: SNF Pending bed offer   Patient Goals and CMS Choice Patient states their goals for this hospitalization and ongoing recovery are:: SNF      Expected Discharge Plan and Services Expected Discharge Plan: [employer]                                              Prior Living Arrangements/Services              Need for Family Participation in Patient Care: No (Comment) Care giver support system in place?: No (comment)      Activities of Daily Living Home Assistive Devices/Equipment: Crutches, Built-in shower seat ADL Screening (condition at time of admission) Patient's cognitive ability adequate to safely complete daily activities?: Yes Is the patient deaf or have difficulty hearing?: No Does the patient have difficulty seeing, even when wearing glasses/contacts?: No Does the patient have difficulty concentrating, remembering, or making decisions?: No Patient able to express need for assistance with ADLs?: Yes Does the patient have difficulty dressing or bathing?: Yes Independently  performs ADLs?: Yes (appropriate for developmental age) Does the patient have difficulty walking or climbing stairs?: No Weakness of Legs: None Weakness of Arms/Hands: None  Permission Sought/Granted                  Emotional Assessment Appearance:: Appears older than stated age Attitude/Demeanor/Rapport: Engaged Affect (typically observed): Accepting, Adaptable Orientation: : Oriented to Self, Oriented to Place, Oriented to  Time, Oriented to Situation   Psych Involvement: No (comment)  Admission diagnosis:  Status post total knee replacement, right [Z96.651] Patient Active Problem List   Diagnosis Date Noted   Status post total knee replacement, right 08/22/2020   Primary osteoarthritis of right knee 08/21/2020   Leg edema 06/22/2019   Essential hypertension 06/22/2019   Fatigue 06/22/2019   PCP:  Verdie Shire, MD Pharmacy:   [id number] [address], [place] AT [address] [place] [address] Phone: [phone] Fax: [phone]  [facility] [place] [address] [place] [address] Phone: [phone] Fax: [phone]     Social Determinants of Health (SDOH) Interventions    Readmission Risk Interventions No flowsheet data found.

## 2020-08-23 NOTE — NC FL2 (Signed)
[place] MEDICAID FL2 LEVEL OF CARE SCREENING TOOL     IDENTIFICATION  Patient Name: Kimberly Herring Birthdate: [birth year] Sex: female Admission Date (Current Location): 08/22/2020  [facility] and [place] Number:  Herbalist and Address:  The . [facility] [place] [address] [place], [place] [address]      Provider Number: [id number]  Attending Physician Name and Address:  Leandrew Koyanagi, MD  Relative Name and Phone Number:  Arley Phenix (Daughter) [phone] (Mobile    Current Level of Care: Hospital Recommended Level of Care: [place] Prior Approval Number:    Date Approved/Denied:   PASRR Number: [id number]  Discharge Plan: SNF    Current Diagnoses: Patient Active Problem List   Diagnosis Date Noted  . Status post total knee replacement, right 08/22/2020  . Primary osteoarthritis of right knee 08/21/2020  . Leg edema 06/22/2019  . Essential hypertension 06/22/2019  . Fatigue 06/22/2019    Orientation RESPIRATION BLADDER Height & Weight     Self, Time, Situation, Place  Normal Indwelling catheter Weight: 250 lb (113.4 kg) Height:  5\' 2"  (157.5 cm)  BEHAVIORAL SYMPTOMS/MOOD NEUROLOGICAL BOWEL NUTRITION STATUS      Continent Diet (see d/c summary)  AMBULATORY STATUS COMMUNICATION OF NEEDS Skin     Verbally Surgical wounds (Incision right kneww)                       Personal Care Assistance Level of Assistance  Bathing, Feeding, Dressing Bathing Assistance: Maximum assistance Feeding assistance: Independent Dressing Assistance: Maximum assistance Total Care Assistance: Maximum assistance   Functional Limitations Info  Sight, Hearing, Speech Sight Info: Adequate Hearing Info: Adequate Speech Info: Adequate    SPECIAL CARE FACTORS FREQUENCY  PT (By licensed PT), OT (By licensed OT)     PT Frequency: 5x/week OT Frequency: 5x/week            Contractures      Additional Factors Info  Code Status,  Allergies Code Status Info: Full Allergies Info: Bactrim, ambien, Metronidazole, lyrica, Depakene, nickel, NSAIDS, percocet, Compazine, contrave           Current Medications (08/23/2020):  This is the current hospital active medication list Current Facility-Administered Medications  Medication Dose Route Frequency Provider Last Rate Last Admin  . 0.9 %  sodium chloride infusion   Intravenous Continuous Leandrew Koyanagi, MD 125 mL/hr at 08/23/20 0351 New Bag at 08/23/20 0351  . acarbose (PRECOSE) tablet 50 mg  50 mg Oral TID WC Pierce, Dwayne A, RPH   50 mg at 08/23/20 1340  . acetaminophen (TYLENOL) tablet 1,000 mg  1,000 mg Oral TID PRN Leandrew Koyanagi, MD      . acetaminophen (TYLENOL) tablet 1,000 mg  1,000 mg Oral Q6H Leandrew Koyanagi, MD   500 mg at 08/23/20 1255  . acetaminophen (TYLENOL) tablet 325-650 mg  325-650 mg Oral Q6H PRN Leandrew Koyanagi, MD      . albuterol (VENTOLIN HFA) 108 (90 Base) MCG/ACT inhaler 2 puff  2 puff Inhalation Q6H PRN Leandrew Koyanagi, MD   2 puff at 08/22/20 2338  . alum & mag hydroxide-simeth (MAALOX/MYLANTA) 200-200-20 MG/5ML suspension 30 mL  30 mL Oral Q4H PRN Leandrew Koyanagi, MD      . asenapine (SAPHRIS) sublingual tablet 10 mg  10 mg Sublingual QHS Leandrew Koyanagi, MD   10 mg at 08/22/20 2331  . [START ON 08/28/2020] buprenorphine (BUTRANS) 15 MCG/HR 1  patch  1 patch Transdermal Q Dyane Dustman, Marylynn Pearson, MD      . cephALEXin [facility]) capsule 500 mg  500 mg Oral Q6H Leandrew Koyanagi, MD      . desipramine (NORPRAMIN) tablet 50 mg  50 mg Oral QHS Leandrew Koyanagi, MD   50 mg at 08/22/20 2331  . dicyclomine (BENTYL) capsule 10 mg  10 mg Oral TID Leandrew Koyanagi, MD   10 mg at 08/23/20 0932  . diphenhydrAMINE (BENADRYL) 12.5 MG/5ML elixir 25 mg  25 mg Oral Q4H PRN Leandrew Koyanagi, MD      . docusate sodium (COLACE) capsule 100 mg  100 mg Oral BID Leandrew Koyanagi, MD   100 mg at 08/23/20 0932  . DULoxetine (CYMBALTA) DR capsule 120 mg  120 mg Oral QHS Leandrew Koyanagi, MD   120 mg at 08/22/20  2242  . fluticasone (FLONASE) 50 MCG/ACT nasal spray 2 spray  2 spray Each Nare BID Leandrew Koyanagi, MD   2 spray at 08/23/20 0934  . folic acid (FOLVITE) tablet 1 mg  1 mg Oral BID Leandrew Koyanagi, MD   1 mg at 08/23/20 0932  . gabapentin (NEURONTIN) capsule 800 mg  800 mg Oral TID Leandrew Koyanagi, MD   800 mg at 08/23/20 0931  . HYDROcodone-acetaminophen (NORCO) 7.5-325 MG per tablet 1 tablet  1 tablet Oral Q4H PRN Marybelle Killings, MD   1 tablet at 08/23/20 1046  . HYDROmorphone (DILAUDID) injection 0.5-1 mg  0.5-1 mg Intravenous Q4H PRN Leandrew Koyanagi, MD   1 mg at 08/23/20 1247  . hydroxychloroquine (PLAQUENIL) tablet 200 mg  200 mg Oral Daily Leandrew Koyanagi, MD   200 mg at 08/23/20 0932  . hydrOXYzine (ATARAX/VISTARIL) tablet 25 mg  25 mg Oral TID PRN Leandrew Koyanagi, MD      . lamoTRIgine (LAMICTAL) tablet 200 mg  200 mg Oral BID Leandrew Koyanagi, MD   200 mg at 08/23/20 0932  . latanoprost (XALATAN) 0.005 % ophthalmic solution 1 drop  1 drop Both Eyes QHS Leandrew Koyanagi, MD   1 drop at 08/22/20 2336  . linaclotide (LINZESS) capsule 290 mcg  290 mcg Oral Daily PRN Leandrew Koyanagi, MD      . magnesium citrate solution 1 Bottle  1 Bottle Oral Once PRN Leandrew Koyanagi, MD      . menthol-cetylpyridinium (CEPACOL) lozenge 3 mg  1 lozenge Oral PRN Leandrew Koyanagi, MD       Or  . phenol (CHLORASEPTIC) mouth spray 1 spray  1 spray Mouth/Throat PRN Leandrew Koyanagi, MD      . methocarbamol (ROBAXIN) tablet 500 mg  500 mg Oral Q6H PRN Leandrew Koyanagi, MD   500 mg at 08/22/20 2332   Or  . methocarbamol (ROBAXIN) 500 mg in dextrose 5 % 50 mL IVPB  500 mg Intravenous Q6H PRN Leandrew Koyanagi, MD      . Derrill Memo ON 08/28/2020] Methotrexate SOSY 15 mg  15 mg Subcutaneous Q Sun Xu, Naiping M, MD      . metoCLOPramide (REGLAN) tablet 5-10 mg  5-10 mg Oral Q8H PRN Leandrew Koyanagi, MD       Or  . metoCLOPramide (REGLAN) injection 5-10 mg  5-10 mg Intravenous Q8H PRN Leandrew Koyanagi, MD      . ondansetron [facility]) tablet 4 mg  4 mg Oral Q6H PRN  Leandrew Koyanagi, MD  Or  . ondansetron (ZOFRAN) injection 4 mg  4 mg Intravenous Q6H PRN Leandrew Koyanagi, MD      . polyethylene glycol (MIRALAX / GLYCOLAX) packet 17 g  17 g Oral Daily PRN Leandrew Koyanagi, MD      . sorbitol 70 % solution 30 mL  30 mL Oral Daily PRN Leandrew Koyanagi, MD      . topiramate (TOPAMAX) tablet 100 mg  100 mg Oral QHS Leandrew Koyanagi, MD   100 mg at 08/22/20 2330  . topiramate (TOPAMAX) tablet 25 mg  25 mg Oral Daily Joselyn Glassman A, RPH   25 mg at 08/23/20 0932  . tranexamic acid (CYKLOKAPRON) IVPB 1,000 mg  1,000 mg Intravenous Once Leandrew Koyanagi, MD      . tranexamic acid (LYSTEDA) tablet 1,300 mg  1,300 mg Oral TID Leandrew Koyanagi, MD   1,300 mg at 08/23/20 0936  . verapamil (CALAN-SR) CR tablet 120 mg  120 mg Oral BID Leandrew Koyanagi, MD   120 mg at 08/23/20 3220     Discharge Medications: Please see discharge summary for a list of discharge medications.  Relevant Imaging Results:  Relevant Lab Results:   Additional Information SSN# Fowler Sephira Zellman, LCSW

## 2020-08-23 NOTE — Plan of Care (Signed)

## 2020-08-23 NOTE — Evaluation (Signed)
Physical Therapy Evaluation Patient Details Name: Kimberly Herring MRN: [id number] DOB: 1966-03-20 Today's Date: 08/23/2020   History of Present Illness  Pt is a 54 y.o. female s/p elective R TKA on 08/22/20. PMH includes HTN, fibromyalgia, arthritis, bipolar 2 disorder, depression, anxiety, bilateral knee sx, hip sx.    Clinical Impression  Pt presents with an overall decrease in functional mobility secondary to above. PTA, pt mod indep and lives alone. Educ on precautions, positioning, therex, and importance of mobility. Today, pt able to initiate transfer and gait training with RW, requiring up to [facility] for mobility and assist for lower body dressing. Pt would benefit from continued acute PT services to maximize functional mobility and independence prior to d/c with SNF-level therapies.     Follow Up Recommendations SNF;Supervision for mobility/OOB;Follow surgeon's recommendation for DC plan and follow-up therapies    Equipment Recommendations  None recommended by PT    Recommendations for Other Services       Precautions / Restrictions Precautions Precautions: Knee;Fall Precaution Comments: Verbally reviewed knee precautions Restrictions Weight Bearing Restrictions: Yes RLE Weight Bearing: Weight bearing as tolerated      Mobility  Bed Mobility Overal bed mobility: Needs Assistance Bed Mobility: Supine to Sit     Supine to sit: Mod assist;HOB elevated     General bed mobility comments: ModA for RLE management, heavy reliance on UE support and use of bed rail    Transfers Overall transfer level: Needs assistance Equipment used: Rolling walker (2 wheeled) Transfers: Sit to/from Stand Sit to Stand: Min assist         General transfer comment: Increased time and effort preparing to stand, max cues for sequencing and foot/hand placement, requiring assist to achieve full R knee flexion, difficulty flexing L knee; minA for trunk elevation and stability with standing trials  from bed and recliner; heavy reliance on UE support to push into standing  Ambulation/Gait Ambulation/Gait assistance: Min guard Gait Distance (Feet): 1 Feet Assistive device: Rolling walker (2 wheeled) Gait Pattern/deviations: Step-to pattern;Trunk flexed;Antalgic;Decreased weight shift to left Gait velocity: Decreased   General Gait Details: Slow, antalgic steps from bed to recliner with RW and close min guard for balance; cues for sequencing with RW; pt declined further distance secondary to pain  Stairs            Wheelchair Mobility    Modified Rankin (Stroke Patients Only)       Balance Overall balance assessment: Needs assistance   Sitting balance-Leahy Scale: Fair       Standing balance-Leahy Scale: Poor Standing balance comment: Reliant on UE support                             Pertinent Vitals/Pain Pain Assessment: Faces Faces Pain Scale: Hurts even more Pain Location: R knee Pain Descriptors / Indicators: Discomfort;Grimacing;Operative site guarding Pain Intervention(s): Monitored during session;Repositioned;RN gave pain meds during session    Home Living Family/patient expects to be discharged to:: Skilled nursing facility Living Arrangements: Alone   Type of Home: Apartment Home Access: Level entry     Home Layout: One level Home Equipment: Walker - 2 wheels;Cane - single point;Crutches      Prior Function Level of Independence: Independent with assistive device(s)         Comments: Most recently using bilateral axillary crutches for ambulation secondary to knee pain     Hand Dominance        Extremity/Trunk Assessment  Upper Extremity Assessment Upper Extremity Assessment: Overall WFL for tasks assessed    Lower Extremity Assessment Lower Extremity Assessment: Generalized weakness;RLE deficits/detail RLE Deficits / Details: s/p R TKA, limited by post-op pain and weakness, unable to perform SLR or full LAQ RLE:  Unable to fully assess due to pain RLE Coordination: decreased gross motor       Communication   Communication: No difficulties  Cognition Arousal/Alertness: Awake/alert Behavior During Therapy: WFL for tasks assessed/performed Overall Cognitive Status: Within Functional Limits for tasks assessed                                        General Comments      Exercises     Assessment/Plan    PT Assessment Patient needs continued PT services  PT Problem List Decreased strength;Decreased range of motion;Decreased activity tolerance;Decreased balance;Decreased mobility;Decreased knowledge of use of DME;Decreased knowledge of precautions;Pain;Obesity       PT Treatment Interventions DME instruction;Gait training;Stair training;Functional mobility training;Therapeutic activities;Therapeutic exercise;Balance training;Patient/family education    PT Goals (Current goals can be found in the Care Plan section)  Acute Rehab PT Goals Patient Stated Goal: Post-acute rehab at SNF to regain strength and mobility before return home PT Goal Formulation: With patient Time For Goal Achievement: 09/06/20 Potential to Achieve Goals: Good    Frequency 7X/week   Barriers to discharge Decreased caregiver support;Inaccessible home environment      Co-evaluation               AM-PAC PT "6 Clicks" Mobility  Outcome Measure Help needed turning from your back to your side while in a flat bed without using bedrails?: A Lot Help needed moving from lying on your back to sitting on the side of a flat bed without using bedrails?: A Lot Help needed moving to and from a bed to a chair (including a wheelchair)?: A Lot Help needed standing up from a chair using your arms (e.g., wheelchair or bedside chair)?: A Lot Help needed to walk in hospital room?: A Little Help needed climbing 3-5 steps with a railing? : A Lot 6 Click Score: 13    End of Session Equipment Utilized During  Treatment: Gait belt Activity Tolerance: Patient tolerated treatment well;Patient limited by pain Patient left: in chair;with call bell/phone within reach Nurse Communication: Mobility status;Other (comment) (no chair alarm in unit clean utility room; RN Leatrice Jewels) ok with no chair alarm; pt in agreement to call when needing to get out of chair) PT Visit Diagnosis: Other abnormalities of gait and mobility (R26.89);Pain;Muscle weakness (generalized) (M62.81) Pain - Right/Left: Right Pain - part of body: Knee    Time: 4287-6811 PT Time Calculation (min) (ACUTE ONLY): 27 min   Charges:   PT Evaluation $PT Eval Low Complexity: 1 Low PT Treatments $Therapeutic Activity: 8-22 mins   Mabeline Caras, PT, DPT Acute Rehabilitation Services  Pager [phone] Office [place] 08/23/2020, 10:45 AM

## 2020-08-24 LAB — SARS CORONAVIRUS 2 BY RT PCR (HOSPITAL ORDER, PERFORMED IN ~~LOC~~ HOSPITAL LAB): SARS Coronavirus 2: NEGATIVE

## 2020-08-24 MED ORDER — CEPHALEXIN 500 MG PO CAPS: 500.0000 mg | ORAL_CAPSULE | Freq: Four times a day (QID) | ORAL | 0 refills | Status: AC

## 2020-08-24 MED ORDER — METHOTREXATE SODIUM CHEMO INJECTION 50 MG/2ML
15.0000 mg | INTRAMUSCULAR | Status: DC
  Administered 2020-08-24: 15 mg via SUBCUTANEOUS
  Filled 2020-08-24: qty 0.6

## 2020-08-24 NOTE — Discharge Summary (Addendum)
Patient ID: Kimberly Herring MRN: [id number] DOB/AGE: 12/19/1965 54 y.o.  Admit date: 08/22/2020 Discharge date: 08/25/2020  Admission Diagnoses:  Principal Problem:   Primary osteoarthritis of right knee Active Problems:   Status post total knee replacement, right   Discharge Diagnoses:  Same  Past Medical History:  Diagnosis Date  . Anemia   . Anxiety   . Arthritis   . Asthma   . Bipolar 2 disorder ([place])   . Complication of anesthesia    hard to put to sleep  . Depression   . Family history of adverse reaction to anesthesia    Mother is hard to wake up  . Fibromyalgia   . Hypertension   . Hypoglycemia following gastrointestinal surgery    post-bariatric surgery hypoglycemia (followed at [facility])  . Lung nodules    lung nodules ~1999; diagnosed wtih "[facility]", reported follow-up imaging showed nodules had resolved but some "scar tissue" present  . Migraine aura, persistent   . 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 (Hematologist Dr. Sterling Big, MD at [facility])  . Pneumonia   . Undifferentiated connective tissue disease [facility])    [facility]    Surgeries: Procedure(s): RIGHT TOTAL KNEE ARTHROPLASTY on 08/22/2020   Consultants:   Discharged Condition: Improved  Hospital Course: Kimberly Herring is an 54 y.o. female who was admitted 08/22/2020 for operative treatment ofPrimary osteoarthritis of right knee.she required multiple doses of dilaudid for pain control and delaying discharge. Patient has severe unremitting pain that affects sleep, daily activities, and work/hobbies. After pre-op clearance the patient was taken to the operating room on 08/22/2020 and underwent  Procedure(s): RIGHT TOTAL KNEE ARTHROPLASTY.    Patient was given perioperative antibiotics:  Anti-infectives (From admission, onward)   Start     Dose/Rate Route Frequency Ordered Stop   08/24/20 0000  cephALEXin (KEFLEX) 500 MG capsule        500 mg Oral  Every 6 hours 08/24/20 0804     08/23/20 1345  cephALEXin (KEFLEX) capsule 500 mg        500 mg Oral Every 6 hours 08/23/20 1256     08/23/20 1000  hydroxychloroquine (PLAQUENIL) tablet 200 mg        200 mg Oral Daily 08/22/20 1756     08/22/20 1845  ceFAZolin (ANCEF) IVPB 2g/100 mL premix        2 g 200 mL/hr over 30 Minutes Intravenous Every 6 hours 08/22/20 1756 08/23/20 0118   08/22/20 1359  vancomycin (VANCOCIN) powder  Status:  Discontinued          As needed 08/22/20 1359 08/22/20 1529   08/22/20 0930  ceFAZolin (ANCEF) IVPB 2g/100 mL premix        2 g 200 mL/hr over 30 Minutes Intravenous On call to O.R. 08/22/20 0925 08/22/20 1350   08/22/20 0930  ceFAZolin (ANCEF) 2-4 GM/100ML-% IVPB       Note to Pharmacy: Gleason, Ginger   : cabinet override      08/22/20 0930 08/23/20 0118       Patient was given sequential compression devices, early ambulation, and chemoprophylaxis to prevent DVT.  Patient benefited maximally from hospital stay and there were no complications.    Recent vital signs:  Patient Vitals for the past 24 hrs:  BP Temp Temp src Pulse Resp SpO2  08/25/20 0621 (!) 114/59 97.9 F (36.6 C) Oral 80 18 98 %  08/24/20 2057 125/72 98.3 F (36.8 C) Oral  93 18 100 %  08/24/20 1359 112/60 97.7 F (36.5 C) Oral 91 18 100 %     Recent laboratory studies:  Recent Labs    08/23/20 0252  WBC 7.7  HGB 10.6*  HCT 34.4*  PLT 198  NA 130*  K 3.9  CL 102  CO2 21*  BUN 9  CREATININE 0.84  GLUCOSE 132*  CALCIUM 8.2*     Discharge Medications:   Allergies as of 08/25/2020      Reactions   Bactrim [sulfamethoxazole-trimethoprim] Anaphylaxis   Metronidazole Hives, Itching, Nausea And Vomiting   Ambien [zolpidem] Other (See Comments)   Sleep Walking   Depakene [valproic Acid] Other (See Comments)   Foot Ulcers   Lyrica [pregabalin] Swelling   Site of swelling:    Nickel Hives   Nsaids Other (See Comments)   Platelet dysfunction   Percocet  [oxycodone-acetaminophen] Hives   Compazine [prochlorperazine] Anxiety   Contrave [naltrexone-bupropion Hcl Er] Swelling, Rash   Site of swelling:       Medication List    STOP taking these medications   acetaminophen 500 MG tablet Commonly known as: TYLENOL   potassium chloride 10 MEQ tablet Commonly known as: KLOR-CON     TAKE these medications   acarbose 25 MG tablet Commonly known as: PRECOSE Take 50 mg by mouth 3 (three) times daily with meals.   albuterol 108 (90 Base) MCG/ACT inhaler Commonly known as: VENTOLIN HFA Inhale 2 puffs into the lungs every 6 (six) hours as needed for wheezing or shortness of breath.   Butrans 15 MCG/HR Generic drug: buprenorphine Place 1 patch onto the skin every Sunday.   CALCIUM 500 + D PO Take 1 tablet by mouth daily.   cephALEXin 500 MG capsule Commonly known as: KEFLEX Take 1 capsule (500 mg total) by mouth every 6 (six) hours.   cetirizine 10 MG tablet Commonly known as: ZYRTEC Take 10 mg by mouth at bedtime.   desipramine 50 MG tablet Commonly known as: NORPRAMIN Take 50 mg by mouth at bedtime.   diclofenac Sodium 1 % Gel Commonly known as: VOLTAREN Apply 1 application topically 2 (two) times daily as needed (pain).   dicyclomine 10 MG capsule Commonly known as: BENTYL Take 10 mg by mouth 3 (three) times daily.   docusate sodium 100 MG capsule Commonly known as: Colace Take 1 capsule (100 mg total) by mouth daily as needed.   DULoxetine 60 MG capsule Commonly known as: CYMBALTA Take 120 mg by mouth at bedtime.   Emgality 120 MG/ML Sosy Generic drug: Galcanezumab-gnlm Inject 120 mg into the skin every 28 (twenty-eight) days.   fluticasone 50 MCG/ACT nasal spray Commonly known as: FLONASE Place 1 spray into both nostrils 2 (two) times daily. What changed: how much to take   fluticasone-salmeterol 115-21 MCG/ACT inhaler Commonly known as: ADVAIR HFA Inhale 2 puffs into the lungs 2 (two) times daily as needed  (shortness of breath).   folic acid 1 MG tablet Commonly known as: FOLVITE Take 1 mg by mouth 2 (two) times daily.   gabapentin 800 MG tablet Commonly known as: NEURONTIN Take 800 mg by mouth 3 (three) times daily.   Glucagon 3 MG/DOSE Powd Place 1 spray into the nose as needed (hypoglycemia).   HYDROcodone-acetaminophen 7.5-325 MG tablet Commonly known as: Norco Take 1-2 tablets by mouth every 6 (six) hours as needed for moderate pain. To be taken after surgery   hydroxychloroquine 200 MG tablet Commonly known as: PLAQUENIL Take 200 mg  by mouth daily.   hydrOXYzine 25 MG tablet Commonly known as: ATARAX/VISTARIL Take 25 mg by mouth 3 (three) times daily as needed (migraines/headaches).   lamoTRIgine 200 MG tablet Commonly known as: LAMICTAL Take 200 mg by mouth 2 (two) times daily.   latanoprost 0.005 % ophthalmic solution Commonly known as: XALATAN Place 1 drop into both eyes at bedtime.   Linzess 290 MCG Caps capsule Generic drug: linaclotide Take 290 mcg by mouth daily as needed (constipation).   Methotrexate 25 MG/ML Sosy Inject 15 mg into the skin every Sunday.   multivitamin tablet Take 1 tablet by mouth daily.   omeprazole 40 MG capsule Commonly known as: PRILOSEC Take 40 mg by mouth 2 (two) times daily.   ondansetron 4 MG disintegrating tablet Commonly known as: ZOFRAN-ODT Take 4 mg by mouth every 8 (eight) hours as needed for nausea or vomiting.   ondansetron 4 MG tablet Commonly known as: Zofran Take 1 tablet (4 mg total) by mouth every 8 (eight) hours as needed for nausea or vomiting. To be taken after surgery   PROBIOTIC PO Take 1 capsule by mouth daily.   Saphris 10 MG Subl Generic drug: Asenapine Maleate Place 10 mg under the tongue at bedtime.   tiZANidine 4 MG tablet Commonly known as: ZANAFLEX Take 12 mg by mouth at bedtime.   Topamax 25 MG tablet Generic drug: topiramate Take 25-100 mg by mouth See admin instructions. Take 25 mg  in the morning and 100 mg at night   tranexamic acid 650 MG Tabs tablet Commonly known as: LYSTEDA Take 1,300 mg by mouth See admin instructions. Take 1300 mg 3 times daily start the day before procedure, taking for a total of 5 days then stop   verapamil 120 MG CR tablet Commonly known as: CALAN-SR Take 120 mg by mouth 2 (two) times daily.            Durable Medical Equipment  (From admission, onward)         Start     Ordered   08/22/20 1757  DME Walker rolling  Once       Question:  Patient needs a walker to treat with the following condition  Answer:  Total knee replacement status   08/22/20 1756   08/22/20 1757  DME 3 n 1  Once        11 /29/21 1756   08/22/20 1757  DME Bedside commode  Once       Question:  Patient needs a bedside commode to treat with the following condition  Answer:  Total knee replacement status   08/22/20 1756          Diagnostic Studies: DG Chest 2 View  Result Date: 08/11/2020 CLINICAL DATA:  Preoperative EXAM: CHEST - 2 VIEW COMPARISON:  03/10/2019 FINDINGS: The heart size and mediastinal contours are within normal limits. Both lungs are clear. The visualized skeletal structures are unremarkable. IMPRESSION: No acute abnormality of the lungs. Electronically Signed   By: Eddie Candle M.D.   On: 08/11/2020 18:57   DG Knee Right Port  Result Date: 08/22/2020 CLINICAL DATA:  Post right total knee replacement EXAM: PORTABLE RIGHT KNEE - 1-2 VIEW COMPARISON:  Radiographs 07/27/2020 FINDINGS: Overlying dressings may obscure fine bone and soft-tissue detail. Patient is post right total knee arthroplasty with posterior patellar resurfacing. Hardware is in expected alignment without acute complication. Expected postsurgical soft tissue changes including soft tissue gas are present. Few punctate joint bodies along the anterior joint line,  can be seen in the postoperative state. IMPRESSION: Status post right total knee arthroplasty without acute  complication. Electronically Signed   By: Lovena Le M.D.   On: 08/22/2020 15:59   XR KNEE 3 VIEW RIGHT  Result Date: 07/27/2020 Extensive tricompartmental degenerative joint disease with varus deformity.   Disposition: Discharge disposition: 03-Skilled Nursing Facility          Follow-up Information    Leandrew Koyanagi, MD. Schedule an appointment as soon as possible for a visit in 2 weeks.   Specialty: Orthopedic Surgery Contact information: [address] [place] [address] [phone]                Signed: Aundra Dubin 08/25/2020, 7:54 AM

## 2020-08-24 NOTE — TOC Progression Note (Addendum)
Transition of Care [facility]) - Progression Note    Patient Details  Name: Kimberly Herring MRN: [id number] Date of Birth: Sep 03, 1966  Transition of Care [facility]) CM/SW [place], [place] Phone Number: 08/24/2020, 9:43 AM  Clinical Narrative:      CSW met with pt to discuss SNF choice. Pt chooses [name].   CSW called [facility] admissions; left message requesting to confirm offer and to start insurance auth.   1042: CSW reached out to [name] with [facility] and informed her that [facility] Kauf[facility] would need to be started.   1147: CSW received call from [place] at [facility]. She informed CSW that she has spent an hour trying to contact [place] with no success. She is requesting if CSW has a contact or a pt care coordinator contact with [facility]. CSW does not have a contact and has not been contacted by a [employer], television/film/video. CSW provides [place] with provider service and pre certification numbers located on back of pt insurance card.   Expected Discharge Plan: Skilled Nursing Facility Barriers to Discharge: SNF Pending bed offer  Expected Discharge Plan and Services Expected Discharge Plan: [place]         Expected Discharge Date: 08/24/20                                     Social Determinants of Health (SDOH) Interventions    Readmission Risk Interventions No flowsheet data found.

## 2020-08-24 NOTE — Progress Notes (Signed)
Patient noted to have purewick. I informed patient it is recommended she calls to use the bedside commode if she has to use the bathroom. She informed me that she has had accidents three times today due to urgency and she requested to use the purewick overnight, I educated patient on the importance of getting out of bed.    She also informed me she has used her CPM for 2 hours today. I called Ortho tech to place her on the CPM. Ortho Tech informed me that she was  in the ED and it will take her 30 mins to get to her bedside. Patient then declined to use the CPM at this time.  She requested to be placed on the CPM at 8:00 am instead.

## 2020-08-24 NOTE — Progress Notes (Signed)
Physical Therapy Treatment Patient Details Name: Kimberly Herring MRN: [id number] DOB: 22-Mar-1966 Today's Date: 08/24/2020    History of Present Illness Pt is a 54 y.o. female s/p elective R TKA on 08/22/20. PMH includes HTN, fibromyalgia, arthritis, bipolar 2 disorder, depression, anxiety, bilateral knee sx, hip sx.   PT Comments    Pt slowly progressing with mobility. Today's session focused on transfer and gait training with RW. Pt remains limited by pain, generalized weakness, decreased activity tolerance and impaired balance strategies; at high risk for falls. Pt also dependent for LB ADL tasks. Continue to recommend SNF-level therapies to maximize functional mobility and independence prior to return home.    Follow Up Recommendations  SNF;Supervision for mobility/OOB;Follow surgeon's recommendation for DC plan and follow-up therapies     Equipment Recommendations  None recommended by PT    Recommendations for Other Services       Precautions / Restrictions Precautions Precautions: Knee;Fall Restrictions Weight Bearing Restrictions: Yes RLE Weight Bearing: Weight bearing as tolerated    Mobility  Bed Mobility Overal bed mobility: Needs Assistance Bed Mobility: Supine to Sit     Supine to sit: Supervision;HOB elevated     General bed mobility comments: Increased time and effort, reliant on use of bed rail; UE support to assist RLE to EOB  Transfers Overall transfer level: Needs assistance Equipment used: Rolling walker (2 wheeled) Transfers: Sit to/from Stand Sit to Stand: Min guard         General transfer comment: Increased time and effort to stand due to knee pain; able to stand from EOB and recliner to RW with min guard for balance; poor eccentric control due to pain with knee flexion; heavy reliance on BUE support  Ambulation/Gait Ambulation/Gait assistance: Min guard Gait Distance (Feet): 60 Feet Assistive device: Rolling walker (2 wheeled) Gait  Pattern/deviations: Step-to pattern;Trunk flexed;Antalgic;Decreased weight shift to left Gait velocity: Decreased Gait velocity interpretation: <1.31 ft/sec, indicative of household ambulator General Gait Details: Slow, antalgic gait with RW and min guard for balance; cues for increased step length and heel to toe gait pattern with RLE; improved sequencing with RW; pt declined further distance secondary to pain and fatigue   Stairs             Wheelchair Mobility    Modified Rankin (Stroke Patients Only)       Balance Overall balance assessment: Needs assistance   Sitting balance-Leahy Scale: Fair       Standing balance-Leahy Scale: Fair                              Cognition Arousal/Alertness: Awake/alert Behavior During Therapy: WFL for tasks assessed/performed Overall Cognitive Status: Within Functional Limits for tasks assessed                                        Exercises      General Comments General comments (skin integrity, edema, etc.): Pt set-up for washup after session (NT aware)      Pertinent Vitals/Pain Pain Assessment: Faces Faces Pain Scale: Hurts little more Pain Location: R knee Pain Descriptors / Indicators: Discomfort;Grimacing;Operative site guarding Pain Intervention(s): Monitored during session;Premedicated before session    Home Living                      Prior Function  PT Goals (current goals can now be found in the care plan section) Progress towards PT goals: Progressing toward goals    Frequency    7X/week      PT Plan Current plan remains appropriate    Co-evaluation              AM-PAC PT "6 Clicks" Mobility   Outcome Measure  Help needed turning from your back to your side while in a flat bed without using bedrails?: A Little Help needed moving from lying on your back to sitting on the side of a flat bed without using bedrails?: A Little Help needed  moving to and from a bed to a chair (including a wheelchair)?: A Little Help needed standing up from a chair using your arms (e.g., wheelchair or bedside chair)?: A Little Help needed to walk in hospital room?: A Little Help needed climbing 3-5 steps with a railing? : A Lot 6 Click Score: 17    End of Session   Activity Tolerance: Patient tolerated treatment well;Patient limited by pain Patient left: in chair;with call bell/phone within reach (set-up for washup with NT) Nurse Communication: Mobility status PT Visit Diagnosis: Other abnormalities of gait and mobility (R26.89);Pain;Muscle weakness (generalized) (M62.81) Pain - Right/Left: Right Pain - part of body: Knee     Time: 1324-4010 PT Time Calculation (min) (ACUTE ONLY): 26 min  Charges:  $Gait Training: 8-22 mins $Therapeutic Activity: 8-22 mins                    Mabeline Caras, PT, DPT Acute Rehabilitation Services  Pager [phone] Office [place] 08/24/2020, 12:35 PM

## 2020-08-24 NOTE — Progress Notes (Signed)
Subjective: 2 Days Post-Op Procedure(s) (LRB): RIGHT TOTAL KNEE ARTHROPLASTY (Right) Patient reports pain as moderate.    Objective: Vital signs in last 24 hours: Temp:  [98.1 F (36.7 C)-98.5 F (36.9 C)] 98.1 F (36.7 C) (12/01 0453) Pulse Rate:  [84-95] 90 (12/01 0453) Resp:  [17-19] 18 (12/01 0453) BP: (96-129)/(54-75) 123/75 (12/01 0453) SpO2:  [97 %-100 %] 100 % (12/01 0453)  Intake/Output from previous day: 11/30 0701 - 12/01 0700 In: 960 [P.O.:960] Out: 1800 [Urine:1800] Intake/Output this shift: No intake/output data recorded.  Recent Labs    08/23/20 0252  HGB 10.6*   Recent Labs    08/23/20 0252  WBC 7.7  RBC 3.93  HCT 34.4*  PLT 198   Recent Labs    08/23/20 0252  NA 130*  K 3.9  CL 102  CO2 21*  BUN 9  CREATININE 0.84  GLUCOSE 132*  CALCIUM 8.2*   No results for input(s): LABPT, INR in the last 72 hours.  Neurologically intact Neurovascular intact Sensation intact distally Intact pulses distally Dorsiflexion/Plantar flexion intact Incision: scant drainage No cellulitis present Compartment soft   Assessment/Plan: 2 Days Post-Op Procedure(s) (LRB): RIGHT TOTAL KNEE ARTHROPLASTY (Right) Advance diet Up with therapy D/C IV fluids Discharge to SNF once insurance approves and bed available WBAT RLE ABLA- mild and stable D/c to snf covid test ordered this am  Anticipated LOS equal to or greater than 2 midnights due to - Age 52 and older with one or more of the following:  - Obesity  - Expected need for hospital services (PT, OT, Nursing) required for safe  discharge  - Anticipated need for postoperative skilled nursing care or inpatient rehab  - Active co-morbidities: platelet storage pool disease OR   - Unanticipated findings during/Post Surgery: Slow post-op progression: GI, pain control, mobility  - Patient is a high risk of re-admission due to: Barriers to post-acute care (logistical, no family support in home)    Aundra Dubin 08/24/2020, 8:02 AM

## 2020-08-25 DIAGNOSIS — M25761 Osteophyte, right knee: Secondary | ICD-10-CM | POA: Diagnosis present

## 2020-08-25 DIAGNOSIS — Z90710 Acquired absence of both cervix and uterus: Secondary | ICD-10-CM | POA: Diagnosis not present

## 2020-08-25 DIAGNOSIS — D62 Acute posthemorrhagic anemia: Secondary | ICD-10-CM | POA: Diagnosis not present

## 2020-08-25 DIAGNOSIS — Z881 Allergy status to other antibiotic agents status: Secondary | ICD-10-CM | POA: Diagnosis not present

## 2020-08-25 DIAGNOSIS — Z87892 Personal history of anaphylaxis: Secondary | ICD-10-CM | POA: Diagnosis not present

## 2020-08-25 DIAGNOSIS — F419 Anxiety disorder, unspecified: Secondary | ICD-10-CM | POA: Diagnosis present

## 2020-08-25 DIAGNOSIS — Z888 Allergy status to other drugs, medicaments and biological substances status: Secondary | ICD-10-CM | POA: Diagnosis not present

## 2020-08-25 DIAGNOSIS — E669 Obesity, unspecified: Secondary | ICD-10-CM | POA: Diagnosis present

## 2020-08-25 DIAGNOSIS — Z9884 Bariatric surgery status: Secondary | ICD-10-CM | POA: Diagnosis not present

## 2020-08-25 DIAGNOSIS — Z1159 Encounter for screening for other viral diseases: Secondary | ICD-10-CM | POA: Diagnosis not present

## 2020-08-25 DIAGNOSIS — F32A Depression, unspecified: Secondary | ICD-10-CM | POA: Diagnosis present

## 2020-08-25 DIAGNOSIS — Z79899 Other long term (current) drug therapy: Secondary | ICD-10-CM | POA: Diagnosis not present

## 2020-08-25 DIAGNOSIS — Z91048 Other nonmedicinal substance allergy status: Secondary | ICD-10-CM | POA: Diagnosis not present

## 2020-08-25 DIAGNOSIS — I10 Essential (primary) hypertension: Secondary | ICD-10-CM | POA: Diagnosis present

## 2020-08-25 DIAGNOSIS — F3181 Bipolar II disorder: Secondary | ICD-10-CM | POA: Diagnosis present

## 2020-08-25 DIAGNOSIS — J45909 Unspecified asthma, uncomplicated: Secondary | ICD-10-CM | POA: Diagnosis present

## 2020-08-25 DIAGNOSIS — Z885 Allergy status to narcotic agent status: Secondary | ICD-10-CM | POA: Diagnosis not present

## 2020-08-25 DIAGNOSIS — Z8701 Personal history of pneumonia (recurrent): Secondary | ICD-10-CM | POA: Diagnosis not present

## 2020-08-25 DIAGNOSIS — M1711 Unilateral primary osteoarthritis, right knee: Secondary | ICD-10-CM | POA: Diagnosis present

## 2020-08-25 DIAGNOSIS — M797 Fibromyalgia: Secondary | ICD-10-CM | POA: Diagnosis present

## 2020-08-25 DIAGNOSIS — Z20822 Contact with and (suspected) exposure to covid-19: Secondary | ICD-10-CM | POA: Diagnosis present

## 2020-08-25 DIAGNOSIS — Z6841 Body Mass Index (BMI) 40.0 and over, adult: Secondary | ICD-10-CM | POA: Diagnosis not present

## 2020-08-25 NOTE — Progress Notes (Signed)
Doyce Para to be D/C'd  per MD order. Discussed with the patient and all questions fully answered.  VSS, Skin clean, dry and intact without evidence of skin break down, no evidence of skin tears noted.  IV catheter discontinued intact. Site without signs and symptoms of complications. Dressing and pressure applied.  An After Visit Summary was printed and placed in chart for facilty. Patient received prescription.  D/c education completed with patient/family including follow up instructions, medication list, d/c activities limitations if indicated, with other d/c instructions as indicated by MD - patient able to verbalize understanding, all questions fully answered.   Patient instructed to return to ED, call 911, or call MD for any changes in condition.   Patient to be escorted via PTAR, and D/C to [place] via Ambulance transport.

## 2020-08-25 NOTE — Progress Notes (Signed)
Subjective: 3 Days Post-Op Procedure(s) (LRB): RIGHT TOTAL KNEE ARTHROPLASTY (Right) Patient reports pain as moderate.    Objective: Vital signs in last 24 hours: Temp:  [97.7 F (36.5 C)-98.3 F (36.8 C)] 97.9 F (36.6 C) (12/02 0621) Pulse Rate:  [80-93] 80 (12/02 0621) Resp:  [18] 18 (12/02 0621) BP: (112-125)/(59-72) 114/59 (12/02 0621) SpO2:  [98 %-100 %] 98 % (12/02 0621)  Intake/Output from previous day: 12/01 0701 - 12/02 0700 In: 840 [P.O.:840] Out: -  Intake/Output this shift: No intake/output data recorded.  Recent Labs    08/23/20 0252  HGB 10.6*   Recent Labs    08/23/20 0252  WBC 7.7  RBC 3.93  HCT 34.4*  PLT 198   Recent Labs    08/23/20 0252  NA 130*  K 3.9  CL 102  CO2 21*  BUN 9  CREATININE 0.84  GLUCOSE 132*  CALCIUM 8.2*   No results for input(s): LABPT, INR in the last 72 hours.  Neurologically intact Neurovascular intact Sensation intact distally Intact pulses distally Dorsiflexion/Plantar flexion intact Incision: scant drainage No cellulitis present Compartment soft   Assessment/Plan: 3 Days Post-Op Procedure(s) (LRB): RIGHT TOTAL KNEE ARTHROPLASTY (Right) Advance diet Up with therapy Discharge to SNF WBAT RLE   Anticipated LOS equal to or greater than 2 midnights due to - Age 52 and older with one or more of the following:  - Obesity  - Expected need for hospital services (PT, OT, Nursing) required for safe  discharge  - Anticipated need for postoperative skilled nursing care or inpatient rehab  - Active co-morbidities: None OR   - Unanticipated findings during/Post Surgery: Slow post-op progression: GI, pain control, mobility  - Patient is a high risk of re-admission due to: None    Kimberly Herring 08/25/2020, 7:52 AM

## 2020-08-25 NOTE — Anesthesia Postprocedure Evaluation (Signed)
Anesthesia Post Note  Patient: Kimberly Herring  Procedure(s) Performed: RIGHT TOTAL KNEE ARTHROPLASTY (Right Knee)     Patient location during evaluation: PACU Anesthesia Type: General and Regional Level of consciousness: awake and alert Pain management: pain level controlled Vital Signs Assessment: post-procedure vital signs reviewed and stable Respiratory status: spontaneous breathing, nonlabored ventilation, respiratory function stable and patient connected to nasal cannula oxygen Cardiovascular status: blood pressure returned to baseline and stable Postop Assessment: no apparent nausea or vomiting Anesthetic complications: no   No complications documented.  Last Vitals:  Vitals:   08/24/20 2057 08/25/20 0621  BP: 125/72 (!) 114/59  Pulse: 93 80  Resp: 18 18  Temp: 36.8 C 36.6 C  SpO2: 100% 98%    Last Pain:  Vitals:   08/25/20 1114  TempSrc:   PainSc: 7                  Akaysha Cobern

## 2020-08-25 NOTE — TOC Transition Note (Signed)
Transition of Care [facility]) - CM/SW Discharge Note   Patient Details  Name: Kimberly Herring MRN: [id number] Date of Birth: Aug 21, 1966  Transition of [facility]) CM/SW Contact:  Bethann Berkshire, [place] Phone Number: 08/25/2020, 10:26 AM   Clinical Narrative:    Eddie [facility] confirmed auth received and pt can admit today.   Patient will DC to: [facility] SNF Anticipated DC date: 08/25/20 Family notified: Pt notified her mother  Transport by: Corey Harold   Per MD patient ready for DC to [facility] SNF . RN, patient, patient's family, and facility notified of DC. Discharge Summary and FL2 sent to facility. RN to call report prior to discharge [phone]). DC packet on chart. Ambulance transport requested for patient once her mother arrives.    CSW will sign off for now as social work intervention is no longer needed. Please consult [place] again if new needs arise.    Final next level of care: Skilled Nursing Facility Barriers to Discharge: No Barriers Identified   Patient Goals and CMS Choice Patient states their goals for this hospitalization and ongoing recovery are:: SNF CMS Medicare.gov Compare Post Acute Care list provided to:: Patient Choice offered to / list presented to : Patient  Discharge Placement              Patient chooses bed at: [facility] Patient to be transferred to facility by: [place] Name of family member notified: Pt notified her mother Patient and family notified of of transfer: 08/25/20  Discharge Plan and Services                                     Social Determinants of Health (SDOH) Interventions     Readmission Risk Interventions No flowsheet data found.

## 2020-08-25 NOTE — Progress Notes (Signed)
Physical Therapy Treatment Patient Details Name: Kimberly Herring MRN: [id number] DOB: 1966/03/10 Today's Date: 08/25/2020    History of Present Illness Pt is a 54 y.o. female s/p elective R TKA on 08/22/20. PMH includes HTN, fibromyalgia, arthritis, bipolar 2 disorder, depression, anxiety, bilateral knee sx, hip sx.   PT Comments    Pt progressing well with mobility. Demonstrates improving tolerance to transfer and gait training with RW; remains limited by pain, generalized weakness, decreased activity tolerance and impaired balance. Continue to recommend SNF-level therapies to maximize functional mobility and independence. Noted pt's plan to d/c to SNF today.   Follow Up Recommendations  SNF;Supervision for mobility/OOB;Follow surgeon's recommendation for DC plan and follow-up therapies     Equipment Recommendations  None recommended by PT    Recommendations for Other Services       Precautions / Restrictions Precautions Precautions: Knee;Fall Precaution Comments: Verbally reviewed knee precautions Restrictions Weight Bearing Restrictions: Yes RLE Weight Bearing: Weight bearing as tolerated    Mobility  Bed Mobility Overal bed mobility: Needs Assistance Bed Mobility: Supine to Sit     Supine to sit: Supervision;HOB elevated     General bed mobility comments: Increased time and effort, reliant on use of bed rail; UE support to assist RLE to EOB  Transfers Overall transfer level: Needs assistance Equipment used: Rolling walker (2 wheeled) Transfers: Sit to/from Stand Sit to Stand: Min guard;Supervision         General transfer comment: Improving sequencing and eccentric control for sit<>stands to RW, reliant on momentum without armrests standing from bed; heavy reliance on UE support from [facility] (over toilet) and recliner; intermittent min guard for balance  Ambulation/Gait Ambulation/Gait assistance: Min guard Gait Distance (Feet): 90 Feet Assistive device: Rolling  walker (2 wheeled) Gait Pattern/deviations: Step-to pattern;Trunk flexed;Antalgic;Decreased weight shift to left Gait velocity: Decreased Gait velocity interpretation: <1.31 ft/sec, indicative of household ambulator General Gait Details: Slow, antalgic gait with RW and intermittent min guard for balance; cues for increased step length and heel to toe gait pattern with RLE; improved sequencing with RW   Stairs             Wheelchair Mobility    Modified Rankin (Stroke Patients Only)       Balance Overall balance assessment: Needs assistance   Sitting balance-Leahy Scale: Fair       Standing balance-Leahy Scale: Fair Standing balance comment: Can static stand at sink to wash hands without UE support; dynamic stability improved with UE support                            Cognition Arousal/Alertness: Awake/alert Behavior During Therapy: WFL for tasks assessed/performed Overall Cognitive Status: Within Functional Limits for tasks assessed                                        Exercises Total Joint Exercises Long Arc Quad: AROM;Right;Seated Knee Flexion: AAROM;Right;Seated (AAROM/pverpressure from PT)    General Comments General comments (skin integrity, edema, etc.): Mother present at end of session. Pt preparing for d/c to SNF this afternoon      Pertinent Vitals/Pain Pain Assessment: Faces Faces Pain Scale: Hurts little more Pain Location: R knee Pain Descriptors / Indicators: Discomfort;Grimacing;Operative site guarding Pain Intervention(s): Monitored during session;Limited activity within patient's tolerance;Ice applied    Home Living  Prior Function            PT Goals (current goals can now be found in the care plan section) Progress towards PT goals: Progressing toward goals    Frequency    7X/week      PT Plan Current plan remains appropriate    Co-evaluation               AM-PAC PT "6 Clicks" Mobility   Outcome Measure  Help needed turning from your back to your side while in a flat bed without using bedrails?: A Little Help needed moving from lying on your back to sitting on the side of a flat bed without using bedrails?: A Little Help needed moving to and from a bed to a chair (including a wheelchair)?: A Little Help needed standing up from a chair using your arms (e.g., wheelchair or bedside chair)?: A Little Help needed to walk in hospital room?: A Little Help needed climbing 3-5 steps with a railing? : A Lot 6 Click Score: 17    End of Session Equipment Utilized During Treatment: Gait belt Activity Tolerance: Patient tolerated treatment well Patient left: in chair;with call bell/phone within reach;with family/visitor present Nurse Communication: Mobility status PT Visit Diagnosis: Other abnormalities of gait and mobility (R26.89);Pain;Muscle weakness (generalized) (M62.81) Pain - Right/Left: Right Pain - part of body: Knee     Time: 1034-1101 PT Time Calculation (min) (ACUTE ONLY): 27 min  Charges:  $Gait Training: 8-22 mins $Therapeutic Activity: 8-22 mins                     Mabeline Caras, PT, DPT Acute Rehabilitation Services  Pager [phone] Office [place] 08/25/2020, 12:31 PM

## 2020-09-02 ENCOUNTER — Encounter: Payer: Self-pay | Admitting: Orthopaedic Surgery

## 2020-09-02 ENCOUNTER — Ambulatory Visit (INDEPENDENT_AMBULATORY_CARE_PROVIDER_SITE_OTHER): Payer: BC Managed Care – PPO | Admitting: Orthopaedic Surgery

## 2020-09-02 DIAGNOSIS — Z96651 Presence of right artificial knee joint: Secondary | ICD-10-CM

## 2020-09-02 NOTE — Progress Notes (Signed)
Post-Op Visit Note   Patient: Kimberly Herring           Date of Birth: [birth year]           MRN: [id number] Visit Date: 09/02/2020 PCP: Verdie Shire, MD   Assessment & Plan:  Chief Complaint:  Chief Complaint  Patient presents with  . Right Knee - Pain   Visit Diagnoses:  1. Status post total knee replacement, right     Plan:   Ms. Nong is status post right total knee replacement on 08/22/2020.  Currently at [facility].  Her Iceman machine broke.  She is taking Norco and Tylenol for the pain.  Denies any fevers or chills or constitutional symptoms.  Right knee shows an intact incision.  Scant amount of serous drainage from 1 the sutures.  Range of motion is progressing nicely.  No signs of infection.  We will keep the sutures intact today.  Betadine paint was applied and a new sterile dressing was placed.  Thigh-high TED hose during the day.  Continue with PT.  I would like her antibiotics to be extended for another week.  Follow-up next Tuesday for suture removal.  Follow-Up Instructions: Return in about 4 days (around 09/06/2020).   Orders:  No orders of the defined types were placed in this encounter.  No orders of the defined types were placed in this encounter.   Imaging: No results found.  PMFS History: Patient Active Problem List   Diagnosis Date Noted  . Status post total knee replacement, right 08/22/2020  . Primary osteoarthritis of right knee 08/21/2020  . Leg edema 06/22/2019  . Essential hypertension 06/22/2019  . Fatigue 06/22/2019   Past Medical History:  Diagnosis Date  . Anemia   . Anxiety   . Arthritis   . Asthma   . Bipolar 2 disorder ([place])   . Complication of anesthesia    hard to put to sleep  . Depression   . Family history of adverse reaction to anesthesia    Mother is hard to wake up  . Fibromyalgia   . Hypertension   . Hypoglycemia following gastrointestinal surgery    post-bariatric surgery hypoglycemia (followed at [facility])  . Lung nodules    lung nodules ~1999; diagnosed wtih "Legionnaires", reported follow-up imaging showed nodules had resolved but some "scar tissue" present  . Migraine aura, persistent   . Platelet storage pool disease [facility])    with history of abnormal post-operative bleeding. (Hematologist Dr. Sterling Big, MD at [facility])  . Pneumonia   . Undifferentiated connective tissue disease [facility])    [facility]    History reviewed. No pertinent family history.  Past Surgical History:  Procedure Laterality Date  . ABDOMINAL HYSTERECTOMY    . CESAREAN SECTION    . ESOPHAGOGASTRODUODENOSCOPY    . FOOT SURGERY Bilateral   . GASTRIC BYPASS    . HERNIA REPAIR    . HIP SURGERY    . KNEE SURGERY Bilateral   . LAPAROSCOPIC OOPHERECTOMY    . SHOULDER SURGERY Bilateral   . TONSILLECTOMY    . TOTAL KNEE ARTHROPLASTY Right 08/22/2020   Procedure: RIGHT TOTAL KNEE ARTHROPLASTY;  Surgeon: Leandrew Koyanagi, MD;  Location: [place];  Service: Orthopedics;  Laterality: Right;  . TUBAL LIGATION     Social History   Occupational History  . Not on file  Tobacco Use  . Smoking status: Never Smoker  . Smokeless tobacco: Never Used  Vaping Use  . Vaping  Use: Never used  Substance and Sexual Activity  . Alcohol use: Yes    Comment: socially  . Drug use: No  . Sexual activity: Not on file

## 2020-09-06 ENCOUNTER — Telehealth: Payer: Self-pay | Admitting: Orthopaedic Surgery

## 2020-09-06 ENCOUNTER — Ambulatory Visit (INDEPENDENT_AMBULATORY_CARE_PROVIDER_SITE_OTHER): Payer: BC Managed Care – PPO | Admitting: Physician Assistant

## 2020-09-06 DIAGNOSIS — Z96651 Presence of right artificial knee joint: Secondary | ICD-10-CM

## 2020-09-06 NOTE — Telephone Encounter (Signed)
Patient's medical records/disability request sent to [employer].

## 2020-09-06 NOTE — Telephone Encounter (Signed)
Received disability paperwork from patient and $25.00 cash    Forwarding to [employer] today

## 2020-09-06 NOTE — Progress Notes (Signed)
Post-Op Visit Note   Patient: Kimberly Herring           Date of Birth: May 10, 1966           MRN: [id number] Visit Date: 09/06/2020 PCP: Verdie Shire, MD   Assessment & Plan:  Chief Complaint:  Chief Complaint  Patient presents with  . Right Knee - Routine Post Op   Visit Diagnoses:  1. Hx of total knee replacement, right     Plan: Patient is a pleasant 54 year old female who comes in today 2 weeks out right total knee replacement.  She has been residing in a skilled nursing facility.  She has been getting physical therapy.  Examination of her right knee reveals a fully healed surgical scar without complication.  Sutures in place.  No evidence of infection.  Calf is soft nontender.  Today, sutures were removed and Steri-Strips applied.  We have recommended that she continues with therapy once discharged from skilled nursing facility.  If they do not arrange this she has been instructed to let us know.  She will follow up with [place] in 4 weeks time for repeat evaluation and 2 view x-rays of the right knee.  Dental prophylaxis reinforced.  Call with concerns or questions.  Follow-Up Instructions: Return in about 4 weeks (around 10/04/2020).   Orders:  No orders of the defined types were placed in this encounter.  No orders of the defined types were placed in this encounter.   Imaging: No new imaging  PMFS History: Patient Active Problem List   Diagnosis Date Noted  . Status post total knee replacement, right 08/22/2020  . Primary osteoarthritis of right knee 08/21/2020  . Leg edema 06/22/2019  . Essential hypertension 06/22/2019  . Fatigue 06/22/2019   Past Medical History:  Diagnosis Date  . Anemia   . Anxiety   . Arthritis   . Asthma   . Bipolar 2 disorder ([place])   . Complication of anesthesia    hard to put to sleep  . Depression   . Family history of adverse reaction to anesthesia    Mother is hard to wake up  . Fibromyalgia   . Hypertension   . Hypoglycemia  following gastrointestinal surgery    post-bariatric surgery hypoglycemia (followed at [facility])  . Lung nodules    lung nodules ~1999; diagnosed wtih "Legionnaires", reported follow-up imaging showed nodules had resolved but some "scar tissue" present  . Migraine aura, persistent   . 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 (Hematologist Dr. Sterling Big, MD at [facility])  . Pneumonia   . Undifferentiated connective tissue disease ([place])    [facility]    No family history on file.  Past Surgical History:  Procedure Laterality Date  . ABDOMINAL HYSTERECTOMY    . CESAREAN SECTION    . ESOPHAGOGASTRODUODENOSCOPY    . FOOT SURGERY Bilateral   . GASTRIC BYPASS    . HERNIA REPAIR    . HIP SURGERY    . KNEE SURGERY Bilateral   . LAPAROSCOPIC OOPHERECTOMY    . SHOULDER SURGERY Bilateral   . TONSILLECTOMY    . TOTAL KNEE ARTHROPLASTY Right 08/22/2020   Procedure: RIGHT TOTAL KNEE ARTHROPLASTY;  Surgeon: Leandrew Koyanagi, MD;  Location: [place];  Service: Orthopedics;  Laterality: Right;  . TUBAL LIGATION     Social History   Occupational History  . Not on file  Tobacco Use  . Smoking status: Never Smoker  .  Smokeless tobacco: Never Used  Vaping Use  . Vaping Use: Never used  Substance and Sexual Activity  . Alcohol use: Yes    Comment: socially  . Drug use: No  . Sexual activity: Not on file

## 2020-09-09 ENCOUNTER — Telehealth: Payer: Self-pay | Admitting: Orthopaedic Surgery

## 2020-09-09 ENCOUNTER — Other Ambulatory Visit: Payer: Self-pay

## 2020-09-09 DIAGNOSIS — Z96651 Presence of right artificial knee joint: Secondary | ICD-10-CM

## 2020-09-09 NOTE — Telephone Encounter (Signed)
Received call from [employer] worker needing an order for out patient (PT) faxed to [facility] (PT) The fax# is [phone]  The number to contact Velna Hatchet is [phone]

## 2020-09-09 NOTE — Telephone Encounter (Signed)
Order in chart s/p a right total knee

## 2020-09-12 ENCOUNTER — Telehealth: Payer: Self-pay | Admitting: Orthopaedic Surgery

## 2020-09-12 NOTE — Telephone Encounter (Signed)
[employer] form received. Sent to [employer].

## 2020-09-14 ENCOUNTER — Other Ambulatory Visit: Payer: Self-pay

## 2020-09-14 ENCOUNTER — Encounter: Payer: Self-pay | Admitting: Physical Therapy

## 2020-09-14 ENCOUNTER — Ambulatory Visit: Payer: BC Managed Care – PPO | Attending: Obstetrics and Gynecology | Admitting: Physical Therapy

## 2020-09-14 DIAGNOSIS — R262 Difficulty in walking, not elsewhere classified: Secondary | ICD-10-CM | POA: Insufficient documentation

## 2020-09-14 DIAGNOSIS — M25661 Stiffness of right knee, not elsewhere classified: Secondary | ICD-10-CM | POA: Insufficient documentation

## 2020-09-14 DIAGNOSIS — M25561 Pain in right knee: Secondary | ICD-10-CM | POA: Insufficient documentation

## 2020-09-14 DIAGNOSIS — M62838 Other muscle spasm: Secondary | ICD-10-CM | POA: Diagnosis present

## 2020-09-14 DIAGNOSIS — R293 Abnormal posture: Secondary | ICD-10-CM | POA: Insufficient documentation

## 2020-09-14 DIAGNOSIS — M6281 Muscle weakness (generalized): Secondary | ICD-10-CM | POA: Insufficient documentation

## 2020-09-14 DIAGNOSIS — Z9181 History of falling: Secondary | ICD-10-CM | POA: Diagnosis present

## 2020-09-14 DIAGNOSIS — M4125 Other idiopathic scoliosis, thoracolumbar region: Secondary | ICD-10-CM | POA: Diagnosis present

## 2020-09-14 DIAGNOSIS — G8929 Other chronic pain: Secondary | ICD-10-CM | POA: Insufficient documentation

## 2020-09-14 NOTE — Therapy (Signed)
[facility] [address], [place], [address] Phone: [phone]   Fax:  [phone]  Physical Therapy Evaluation  Patient Details  Name: Thecla Forgione MRN: [id number] Date of Birth: [birth year] Referring Provider (PT): Naiping M. Roda Shutters, MD   Encounter Date: 09/14/2020   PT End of Session - 09/14/20 1241    Visit Number 1    Number of Visits 24    Date for PT Re-Evaluation 12/07/20    Authorization Type [employer] reporting period from 09/14/2020    Progress Note Due on Visit 10    PT Start Time 1130    PT Stop Time 1215    PT Time Calculation (min) 45 min    Activity Tolerance Patient tolerated treatment well;Patient limited by pain    Behavior During Therapy [facility] for tasks assessed/performed           Past Medical History:  Diagnosis Date   Anemia    Anxiety    Arthritis    Asthma    Bipolar 2 disorder (HCC)    Complication of anesthesia    hard to put to sleep   Depression    Family history of adverse reaction to anesthesia    Mother is hard to wake up   Fibromyalgia    Hypertension    Hypoglycemia following gastrointestinal surgery    post-bariatric surgery hypoglycemia (followed at [facility])   Lung nodules    lung nodules ~1999; diagnosed wtih "[employer]", reported follow-up imaging showed nodules had resolved but some "scar tissue" present   Migraine aura, persistent    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 (Hematologist Dr. Kerry Dory, MD at 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[facility])    [facility]    Past Surgical History:  Procedure Laterality Date   ABDOMINAL HYSTERECTOMY     CESAREAN SECTION     ESOPHAGOGASTRODUODENOSCOPY     FOOT SURGERY Bilateral    GASTRIC BYPASS     HERNIA REPAIR     HIP SURGERY     KNEE SURGERY Bilateral    LAPAROSCOPIC  OOPHERECTOMY     SHOULDER SURGERY Bilateral    TONSILLECTOMY     TOTAL KNEE ARTHROPLASTY Right 08/22/2020   Procedure: RIGHT TOTAL KNEE ARTHROPLASTY;  Surgeon: Tarry Kos, MD;  Location: [facility] OR;  Service: Orthopedics;  Laterality: Right;   TUBAL LIGATION      There were no vitals filed for this visit.    Subjective Assessment - 09/14/20 1148    Subjective Patient states she is here to continue rehab for her R TKA performed 09/21/2020. She did not have any complications after the surgery. She had to go to short term rehab [facility]. She discharged this Monday and has not had home health PT. She lives at home and her mother is helping a little bit like driving to help her with transportation. She is unsure when she will be released to drive. Will return to doctor in 3.5 weeks. She is concerned about preparing meals and bathing. Did order tub bench. She has fear of falling. She is used to sitting in the tub. She has a tendency to get light headed. She has a hand held shower head. Her knee got worse and worse over time and eventually she needed a TKA. She has been having issues since 2012. Plans to  go back to work when she recovers . CPM machine. Needs TKA on left knee. Also has hand pain.   Lots of problems with surgery.  States she has been walking with a cane for the last 2.5 years due to knee pain.    Pertinent History Patient is a 54 y.o. female who presents to outpatient physical therapy with a referral for medical diagnosis s/p R TKA. 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   Relevant past medical history and comorbidities include platelet  storage pool disease (doesn't clot quickly enough, carries a medication to take in case she needs it), fibromyalgia, asthma (has rescue inhaler), lung nodules (being followed), connective tissue disease, bipolar 2 with hallucinations, depression (sees mental health therapist weekly), arthritis, anemia, previous pelvic floor PT, hernia repair, hip surgery, bilateral knee surgery, bilateral shoulder surgery, hypertension, leg edema, fatigue. .  Patient denies hx of cancer, stroke, seizures, lung problem (except asthma and lung nodule being watched), major cardiac events (except clotting disorder), diabetes, unexplained weight loss, changes in bowel or bladder problems, new onset stumbling or dropping things apart from described below.    Limitations Lifting;Standing;Walking;House hold activities    Patient Stated Goals to get better    Currently in Pain? Yes    Pain Score 6    W: 9/10; B: 6/10   Pain Location Knee    Pain Orientation Right;Left   bilateral   Pain Descriptors / Indicators Aching;Dull;Cramping    Pain Radiating Towards left knee and down front of legs to toes.    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              [facility] PT Assessment - 09/14/20 0001      Assessment   Medical Diagnosis s/p R TKA    Referring Provider (PT) [place] M. Erlinda Hong, MD    Onset Date/Surgical Date 08/22/20    Next MD Visit 3.5 weeks    Prior Therapy short term rehab at [facility] [place] ([place] 09/12/20)      Precautions   Precautions Fall      Restrictions   Weight Bearing Restrictions --   none given      Balance Screen   Has the patient fallen in the past 6 months Yes    How many times? 2   fell in bedroom, knee gave out one time   Has the patient had a decrease in activity level because of a fear of falling?  Yes    Is the patient reluctant to leave their home because of a fear of falling?  Yes      [place] residence    Living Arrangements Alone    Available Help at Discharge Family   mother helps  some   Type of Home Apartment    Home Access Level entry    Home Layout One level    Home Equipment Walker - 2 wheels;Tub bench;Hand held shower head;Grab bars - tub/shower;Toilet riser;Bedside commode   CPM machine, reacher, sock aide, shoe horn     Prior Function   Level of Independence Independent    Vocation Full time employment    Vocation Requirements sitting, walking,    Leisure getting together with her freinds for lunch      Cognition   Overall Cognitive Status Within Functional Limits for tasks assessed      Observation/Other Assessments   Focus on Therapeutic Outcomes (FOTO)  32           OBJECTIVE  OBSERVATION/INSPECTION  Tremor: none  Muscle bulk: low in B lower legs. Limited visualization at thighs due to body habitus.   Skin: The incision sites appear to be healing well with no excessive redness, warmth, drainage or signs of infection present.    Bed mobility: supine <> sit and rolling mod I for extra time and caution.   Transfers: sit <> stand from chair and 18.5 inch plinth mod I to RW with BUE support on sitting surface.   Gait: able to ambulate household and short community distance mod I with RW. Dependent on UE support and mild antalgic gait favoring R LE.   PERIPHERAL JOINT MOTION (in degrees) Passive Range of Motion (PROM) *Indicates pain 09/14/20 Date Date  Joint/Motion R/L R/L R/L  Knee     Extension 0/0 / /  Flexoin 103*/113 / /  Comments: B hips and ankles WFL except possibly some restriction in B hip  extension.   MUSCLE PERFORMANCE (MMT):  *Indicates pain 09/14/20 Date Date  Joint/Motion R/L R/L R/L  Hip     Flexion 2+*/4 / /  Abduction / / /  Adduction / / /  External rotation / / /  Internal rotation  / / /  Knee     Extension 4+/4+ / /  Flexion 3+/4 / /  Comments:  09/14/20: B ankles and feet grossly 4+/5 in seated position except B PF ~4/5. Struggled with R active SLR at first and needed assistance bu then was able to complete 5 reps with minimal extensor lag.   FUNCTIONAL/BALANCE TESTS: Five Time Sit to Stand (5TSTS): 13 seconds from 18.5 inch plinth to RW with B UE support on plinth.  Six Minute Walk Test ( ): deferred to next session.   EDUCATION/COGNITION: Patient is alert and oriented X 4.  Objective measurements completed on examination: See above findings.   TREATMENT:   Therapeutic exercise: to centralize symptoms and improve ROM, strength, muscular endurance, and activity tolerance required for successful completion of functional activities.  - Education on diagnosis, prognosis, POC, anatomy and physiology of current condition.  - supine R quad set x 5 - supine R active SLR x 7  - sit <> stand x 6 with BUE support - Education on HEP including handout    HOME EXERCISE PROGRAM  Access Code: [facility] URL: [email or web address] Date: 09/14/2020 Prepared by: Norton Blizzard  Exercises Supine Active Straight Leg Raise - 1 x daily - 3 sets - 10 reps Sit to Stand with Armchair - 1 x daily - 3 sets - 5-10 reps     PT Education - 09/14/20 1241    Education Details Exercise purpose/form. Self management techniques. Education on diagnosis, prognosis, POC, anatomy and physiology of current condition Education on HEP including  handout    Person(s) Educated Patient    Methods Explanation;Demonstration;Tactile cues;Verbal cues;Handout    Comprehension Verbalized understanding;Returned demonstration;Verbal cues required;Tactile cues required;Need  further instruction            PT Short Term Goals - 09/14/20 1245      PT SHORT TERM GOAL #1   Title Be independent with initial home exercise program for self-management of symptoms.    Baseline initial HEP provided at IE (09/14/2020);    Time 3    Period Weeks    Status New    Target Date 10/05/20             PT Long Term Goals - 09/14/20 1245      PT LONG TERM GOAL #1   Title Be independent with a long-term home exercise program for self-management of symptoms.    Baseline initial HEP provided at IE (09/14/2020);    Time 12    Period Weeks    Status New   TARGET DATE FOR ALL LONG TERM GOALS: 12/07/2020     PT LONG TERM GOAL #2   Title Patient will complete 5 Time Sit To Stand Test in equal or less than 12 seconds from 18.5 inch plinth with no UE support to improve ability to move around at home and at work while carrying items.    Baseline 13 seconds from 18.5 inch plinth to RW with B UE support on plinth (09/14/2020);    Time 12    Period Weeks    Status New      PT LONG TERM GOAL #3   Title Demonstrate improved FOTO score to equal or greater than 41 by visit #15 to demonstrate improvement in overall condition and self-reported functional ability.    Baseline 32 (09/14/2020);    Time 12    Period Weeks    Status New      PT LONG TERM GOAL #4   Title Patient will demonstrate R knee PROM 0 - 120 to improve her ability to navigate over obstacles in her community without difficulty.    Baseline 0-103 (09/14/2020);    Time 12    Period Weeks    Status New      PT LONG TERM GOAL #5   Title Patient will ambulate equal or greater than 1000 feet using LRAD on the 6 Minute Walk Test to demonstrate improved community mobility.    Baseline to be tested session 2 as appropriate (09/14/2020);    Time 12    Period Weeks    Status New      Additional Long Term Goals   Additional Long Term Goals Yes      PT LONG TERM GOAL #6   Title Complete community, work and/or  recreational activities without limitation due to current condition.    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 female referred to outpatient physical therapy with a medical diagnosis of s/p R TKA who presents with signs and symptoms consistent with R knee stiffness, pain, generalized weakness and mobility deficits  following R TKA performed 08/22/2020. She is currently dependent on a RW for mobility and having difficulty with meals due to inability to carry her food. She lacks R knee ROM and requires B UE support to get up from a chair.  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 Patient will benefit from skilled physical therapy intervention to address current body structure impairments and activity limitations to improve function and work towards goals set in current POC in order to return to prior level of function or maximal functional improvement.    Personal Factors and Comorbidities Age;Comorbidity 3+;Past/Current Experience;Fitness;Time since onset of injury/illness/exacerbation;Transportation    Comorbidities Anxiety, Arthritis, Bipolar disorder, Depression, Fibromyalgia, Migraines,Had an abdominal hysterectomy, c-section, B hammer-toe repair, gastric bypass (needs revision), hernia repair, hip-surgery (labral repair), B knee surgery  to regenerate meniscus, B oophorectomy, B shoulder surgeries for rotator cuff repair in 2012 and 2016, tonsillectomy, and  tubal ligation, obesity.    Examination-Activity Limitations Lift;Stairs;Continence;Bend;Locomotion Level;Bathing;Bed Mobility;Squat;Stand;Caring for Others;Carry;Self Feeding;Dressing;Sleep;Transfers    Examination-Participation Restrictions Yard Work;Cleaning;Laundry;Community Activity;Driving;Occupation;Interpersonal Relationship;Meal Prep;Shop    Stability/Clinical Decision Making Evolving/Moderate complexity    Clinical Decision Making Moderate    Rehab Potential Good    PT Frequency 2x / week    PT Duration 12 weeks    PT Treatment/Interventions ADLs/Self Care Home Management;Moist Heat;Electrical Stimulation;Therapeutic activities;Functional mobility training;Stair training;Gait training;Therapeutic exercise;Balance training;Neuromuscular re-education;Patient/family education;Manual techniques;Dry needling;Taping;Spinal Manipulations;Joint Manipulations;Cryotherapy;Passive range of motion;Scar mobilization;Energy conservation;Manual lymph drainage    PT Next Visit Plan assess , progress HEP as appropriate    PT Home Exercise Plan Medbridge Access Code: [email or web address]    Consulted and Agree with Plan of Care Patient           Patient will benefit from skilled therapeutic intervention in order to improve the following deficits and impairments:  Abnormal gait,Decreased endurance,Difficulty walking,Obesity,Improper body mechanics,Pain,Postural dysfunction,Decreased strength,Decreased coordination,Decreased activity tolerance,Decreased skin integrity,Decreased scar mobility,Decreased mobility,Prosthetic Dependency,Impaired perceived functional ability,Hypomobility,Decreased range of motion,Decreased balance,Increased edema,Impaired flexibility  Visit Diagnosis: Chronic pain of right knee  Stiffness of right knee, not elsewhere classified  Muscle weakness  (generalized)  Difficulty in walking, not elsewhere classified  History of falling     Problem List Patient Active Problem List   Diagnosis Date Noted   Status post total knee replacement, right 08/22/2020   Primary osteoarthritis of right knee 08/21/2020   Leg edema 06/22/2019   Essential hypertension 06/22/2019   Fatigue 06/22/2019    Luretha Murphy. Ilsa Iha, PT, DPT 09/14/20, 12:57 PM  [facility]IC[facility] [address] [place], [address] Phone: [phone]   Fax:  [phone]  Name: Delonda Coley MRN: [id number] Date of Birth: 1965/11/21

## 2020-09-17 ENCOUNTER — Encounter: Payer: Self-pay | Admitting: Orthopaedic Surgery

## 2020-09-18 ENCOUNTER — Encounter: Payer: Self-pay | Admitting: Orthopaedic Surgery

## 2020-09-20 ENCOUNTER — Ambulatory Visit: Payer: BC Managed Care – PPO

## 2020-09-20 ENCOUNTER — Encounter: Payer: BC Managed Care – PPO | Admitting: Physical Therapy

## 2020-09-20 ENCOUNTER — Other Ambulatory Visit: Payer: Self-pay

## 2020-09-20 ENCOUNTER — Encounter: Payer: Self-pay | Admitting: Physical Therapy

## 2020-09-20 DIAGNOSIS — M25661 Stiffness of right knee, not elsewhere classified: Secondary | ICD-10-CM

## 2020-09-20 DIAGNOSIS — Z9181 History of falling: Secondary | ICD-10-CM

## 2020-09-20 DIAGNOSIS — R262 Difficulty in walking, not elsewhere classified: Secondary | ICD-10-CM

## 2020-09-20 DIAGNOSIS — M25561 Pain in right knee: Secondary | ICD-10-CM

## 2020-09-20 DIAGNOSIS — M6281 Muscle weakness (generalized): Secondary | ICD-10-CM

## 2020-09-20 NOTE — Therapy (Signed)
[facility] [address], [place], [address] Phone: [phone]   Fax:  [phone]  Physical Therapy Treatment  Patient Details  Name: Kimberly Herring MRN: [id number] Date of Birth: 07-Sep-1966 Referring Provider (PT): Naiping M. Erlinda Hong, MD   Encounter Date: 09/20/2020   PT End of Session - 09/20/20 1815    Visit Number 2    Number of Visits 24    Date for PT Re-Evaluation 12/07/20    Authorization Type [employer] reporting period from 09/14/2020    Authorization Time Period 06/16/20 through 08/25/20    PT Start Time 1803    PT Stop Time 1845    PT Time Calculation (min) 42 min    Activity Tolerance Patient tolerated treatment well;Patient limited by pain    Behavior During Therapy WFL for tasks assessed/performed           Past Medical History:  Diagnosis Date   Anemia    Anxiety    Arthritis    Asthma    Bipolar 2 disorder ([place])    Complication of anesthesia    hard to put to sleep   Depression    Family history of adverse reaction to anesthesia    Mother is hard to wake up   Fibromyalgia    Hypertension    Hypoglycemia following gastrointestinal surgery    post-bariatric surgery hypoglycemia (followed at [facility])   Lung nodules    lung nodules ~1999; diagnosed wtih "Legionnaires", reported follow-up imaging showed nodules had resolved but some "scar tissue" present   Migraine aura, persistent    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 (Hematologist Dr. 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[facility])    [facility]    Past Surgical History:  Procedure Laterality Date   ABDOMINAL HYSTERECTOMY     CESAREAN SECTION     ESOPHAGOGASTRODUODENOSCOPY     FOOT SURGERY Bilateral    GASTRIC BYPASS     HERNIA REPAIR     HIP SURGERY     KNEE SURGERY Bilateral     LAPAROSCOPIC OOPHERECTOMY     SHOULDER SURGERY Bilateral    TONSILLECTOMY     TOTAL KNEE ARTHROPLASTY Right 08/22/2020   Procedure: RIGHT TOTAL KNEE ARTHROPLASTY;  Surgeon: Leandrew Koyanagi, MD;  Location: [place];  Service: Orthopedics;  Laterality: Right;   TUBAL LIGATION      There were no vitals filed for this visit.   Subjective Assessment - 09/20/20 1809    Subjective Patient reported she is having pain today, and is still having swelling on her operated leg. Did report lots of pain after PT session. Has had pain medication prior to PT.    Pertinent History Patient is a 54 y.o. female who presents to outpatient physical therapy with a referral for medical diagnosis s/p R TKA. 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   Relevant past medical history and comorbidities include platelet storage pool disease (doesn't clot quickly enough, carries a medication to take in case she needs it), fibromyalgia, asthma (has rescue inhaler), lung nodules (  being followed), connective tissue disease, bipolar 2 with hallucinations, depression (sees mental health therapist weekly), arthritis, anemia, previous pelvic floor PT, hernia repair, hip surgery, bilateral knee surgery, bilateral shoulder surgery, hypertension, leg edema, fatigue. .  Patient denies hx of cancer, stroke, seizures, lung problem (except asthma and lung nodule being watched), major cardiac events (except clotting disorder), diabetes, unexplained weight loss, changes in bowel or bladder problems, new onset stumbling or dropping things apart from described below.    Limitations Lifting;Standing;Walking;House hold activities     Patient Stated Goals to get better    Currently in Pain? Yes    Pain Score 5     Pain Location Knee    Pain Orientation Right;Left    Pain Descriptors / Indicators Aching;Dull;Cramping    Pain Onset More than a month ago           6 minute walk test: 762ft with RW   Objective measurements, See above findings.    TREATMENT:    Therapeutic exercise: to centralize symptoms and improve ROM, strength, muscular endurance, and activity tolerance required for successful completion of functional activities.  - Education on diagnosis, prognosis, POC, anatomy and physiology of current condition.   -bicycle to improve ROM, monitored throughout, adjusted as able to maximize knee flexion x 5 mins. Seat at level 12, initially very stiff, but pt able to perform full revolution with less complaints of stiffness - supine R quad set x 10 x 3 sec holds - supine R active SLR 2 x 10  Supine heel slides  x10 x 3 sec holds - sit <> stand x 10 with BUE support Seated LAQ/ knee flexion (feet off floor) focused on relaxation x 40mins Total gym x5 bilaterally 50/50 at 18, unable to tolerate more reps due to bilateral pain, assisted off total gym  Seated knee flexion with GTB 2x10   Pt response/clinical impression: 6 MWT assessed this visit, pt able to perform 798ft as a distance with RW. No rest breaks needed. Pt instructed in sit to stand technique to improve safety at home. Total gym squats attempted to improve ROM as well as strength, but pt unable to complete due to elevated bilateral knee pain. The patient would benefit from further skilled PT intervention to maximize function and pain management.   HOME EXERCISE PROGRAM  Access Code: [email or web address] URL: [email or web address] Date: 09/14/2020 Prepared by: [name]   Exercises Supine Active Straight Leg Raise - 1 x daily - 3 sets - 10 reps Sit to Stand with Armchair - 1 x daily - 3 sets - 5-10 reps     PT Education - 09/20/20 1811     Education Details exercise form/technique    Methods Explanation;Demonstration;Tactile cues;Verbal cues    Comprehension Verbalized understanding;Returned demonstration;Verbal cues required;Tactile cues required;Need further instruction            PT Short Term Goals - 09/14/20 1245      PT SHORT TERM GOAL #1   Title Be independent with initial home exercise program for self-management of symptoms.    Baseline initial HEP provided at IE (09/14/2020);    Time 3    Period Weeks    Status New    Target Date 10/05/20             PT Long Term Goals - 09/20/20 1817      PT LONG TERM GOAL #1   Title Be independent with a long-term home exercise program for self-management of symptoms.    Baseline  initial HEP provided at IE (09/14/2020);    Time 12    Period Weeks    Status New   TARGET DATE FOR ALL LONG TERM GOALS: 12/07/2020     PT LONG TERM GOAL #2   Title Patient will complete 5 Time Sit To Stand Test in equal or less than 12 seconds from 18.5 inch plinth with no UE support to improve ability to move around at home and at work while carrying items.    Baseline 13 seconds from 18.5 inch plinth to RW with B UE support on plinth (09/14/2020);    Time 12    Period Weeks    Status New      PT LONG TERM GOAL #3   Title Demonstrate improved FOTO score to equal or greater than 41 by visit #15 to demonstrate improvement in overall condition and self-reported functional ability.    Baseline 32 (09/14/2020);    Time 12    Period Weeks    Status New      PT LONG TERM GOAL #4   Title Patient will demonstrate R knee PROM 0 - 120 to improve her ability to navigate over obstacles in her community without difficulty.    Baseline 0-103 (09/14/2020);    Time 12    Period Weeks    Status New      PT LONG TERM GOAL #5   Title Patient will ambulate equal or greater than 1000 feet using LRAD on the 6 Minute Walk Test to demonstrate improved community mobility.    Baseline to be tested  session 2 as appropriate (09/14/2020); 09/14/2020 773ft    Time 12    Period Weeks    Status New      PT LONG TERM GOAL #6   Title Complete community, work and/or recreational activities without limitation due to current condition.    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 No rest breaks needed. Pt instructed in sit to stand technique to improve safety at home. Total gym squats attempted to improve ROM as well as strength, but pt unable to complete due to elevated bilateral knee pain. The patient would benefit from further skilled PT intervention to maximize function and pain management.    Personal Factors and Comorbidities Age;Comorbidity 3+;Past/Current Experience;Fitness;Time since onset of injury/illness/exacerbation;Transportation    Comorbidities Anxiety, Arthritis, Bipolar disorder, Depression, Fibromyalgia, Migraines,Had an abdominal hysterectomy, c-section, B hammer-toe repair, gastric bypass (needs revision), hernia repair, hip-surgery (labral repair), B knee surgery to regenerate meniscus, B oophorectomy, B shoulder surgeries for rotator cuff repair in 2012 and 2016, tonsillectomy, and  tubal ligation, obesity.    Examination-Activity Limitations Lift;Stairs;Continence;Bend;Locomotion Level;Bathing;Bed Mobility;Squat;Stand;Caring for Others;Carry;Self Feeding;Dressing;Sleep;Transfers    Examination-Participation Restrictions Yard Work;Cleaning;Laundry;Community Activity;Driving;Occupation;Interpersonal Relationship;Meal Prep;Shop    Stability/Clinical Decision Making Evolving/Moderate  complexity    Rehab Potential Good    PT Frequency 2x / week    PT Duration 12 weeks    PT Treatment/Interventions ADLs/Self Care Home Management;Moist Heat;Electrical Stimulation;Therapeutic activities;Functional mobility training;Stair training;Gait  training;Therapeutic exercise;Balance training;Neuromuscular re-education;Patient/family education;Manual techniques;Dry needling;Taping;Spinal Manipulations;Joint Manipulations;Cryotherapy;Passive range of motion;Scar mobilization;Energy conservation;Manual lymph drainage    PT Next Visit Plan progress HEP as appropriate    PT Home Exercise Plan Medbridge Access Code: [email or web address]    Consulted and Agree with Plan of Care Patient           Patient will benefit from skilled therapeutic intervention in order to improve the following deficits and impairments:  Abnormal gait,Decreased endurance,Difficulty walking,Obesity,Improper body mechanics,Pain,Postural dysfunction,Decreased strength,Decreased coordination,Decreased activity tolerance,Decreased skin integrity,Decreased scar mobility,Decreased mobility,Prosthetic Dependency,Impaired perceived functional ability,Hypomobility,Decreased range of motion,Decreased balance,Increased edema,Impaired flexibility  Visit Diagnosis: Chronic pain of right knee  Stiffness of right knee, not elsewhere classified  Muscle weakness (generalized)  Difficulty in walking, not elsewhere classified  History of falling     Problem List Patient Active Problem List   Diagnosis Date Noted   Status post total knee replacement, right 08/22/2020   Primary osteoarthritis of right knee 08/21/2020   Leg edema 06/22/2019   Essential hypertension 06/22/2019   Fatigue 06/22/2019    Lieutenant Diego PT, DPT 9:09 AM,09/21/20   [facility] [address] [place], [address] Phone: [phone]   Fax:  [phone]  Name: Kimberly Herring MRN: [id number] Date of Birth: [birth year]

## 2020-09-22 ENCOUNTER — Ambulatory Visit: Payer: BC Managed Care – PPO

## 2020-09-22 ENCOUNTER — Other Ambulatory Visit: Payer: Self-pay

## 2020-09-22 ENCOUNTER — Encounter: Payer: Self-pay | Admitting: Physical Therapy

## 2020-09-22 DIAGNOSIS — G8929 Other chronic pain: Secondary | ICD-10-CM

## 2020-09-22 DIAGNOSIS — M62838 Other muscle spasm: Secondary | ICD-10-CM

## 2020-09-22 DIAGNOSIS — M6281 Muscle weakness (generalized): Secondary | ICD-10-CM

## 2020-09-22 DIAGNOSIS — R293 Abnormal posture: Secondary | ICD-10-CM

## 2020-09-22 DIAGNOSIS — M4125 Other idiopathic scoliosis, thoracolumbar region: Secondary | ICD-10-CM

## 2020-09-22 DIAGNOSIS — M25561 Pain in right knee: Secondary | ICD-10-CM | POA: Diagnosis not present

## 2020-09-22 DIAGNOSIS — M25661 Stiffness of right knee, not elsewhere classified: Secondary | ICD-10-CM

## 2020-09-22 NOTE — Therapy (Signed)
[place] [address], [place], [address] Phone: [phone]   Fax:  [phone]  Physical Therapy Treatment  Patient Details  Name: Kimberly Herring MRN: [id number] Date of Birth: Jul 12, 1966 Referring Provider (PT): Naiping M. Erlinda Hong, MD   Encounter Date: 09/22/2020   PT End of Session - 09/22/20 1737    Visit Number 3    Number of Visits 24    Date for PT Re-Evaluation 12/07/20    Authorization Type [employer] reporting period from 09/14/2020    Authorization Time Period 06/16/20 through 08/25/20    PT Start Time 1730    PT Stop Time 1810    PT Time Calculation (min) 40 min    Activity Tolerance Patient tolerated treatment well;Patient limited by pain    Behavior During Therapy [facility] for tasks assessed/performed           Past Medical History:  Diagnosis Date  . Anemia   . Anxiety   . Arthritis   . Asthma   . Bipolar 2 disorder ([place])   . Complication of anesthesia    hard to put to sleep  . Depression   . Family history of adverse reaction to anesthesia    Mother is hard to wake up  . Fibromyalgia   . Hypertension   . Hypoglycemia following gastrointestinal surgery    post-bariatric surgery hypoglycemia ([facility])  . Lung nodules    lung nodules ~1999; diagnosed wtih "Legionnaires", reported follow-up imaging showed nodules had resolved but some "scar tissue" present  . Migraine aura, persistent   . 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 (Hematologist Dr. Sterling Big, MD at [facility])  . Pneumonia   . 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 ABDOMINAL HYSTERECTOMY    . CESAREAN SECTION    . ESOPHAGOGASTRODUODENOSCOPY    . FOOT SURGERY Bilateral   . GASTRIC BYPASS    . HERNIA REPAIR    . HIP SURGERY    . KNEE SURGERY Bilateral   .  LAPAROSCOPIC OOPHERECTOMY    . SHOULDER SURGERY Bilateral   . TONSILLECTOMY    . TOTAL KNEE ARTHROPLASTY Right 08/22/2020   Procedure: RIGHT TOTAL KNEE ARTHROPLASTY;  Surgeon: Leandrew Koyanagi, MD;  Location: [place];  Service: Orthopedics;  Laterality: Right;  . TUBAL LIGATION      There were no vitals filed for this visit.   Subjective Assessment - 09/22/20 1738    Subjective Patient stated she is tired today, has had a very full busy day. Did take medicine prior to PT.    Pertinent History Patient is a 54 y.o. female who presents to outpatient physical therapy with a referral for medical diagnosis s/p R TKA. 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   Relevant past medical history and comorbidities include platelet storage pool disease (doesn't clot quickly enough, carries a medication to take in case she needs it), fibromyalgia, asthma (has rescue inhaler), lung nodules (being followed), connective tissue disease, bipolar 2 with hallucinations, depression (sees mental  health therapist weekly), arthritis, anemia, previous pelvic floor PT, hernia repair, hip surgery, bilateral knee surgery, bilateral shoulder surgery, hypertension, leg edema, fatigue. .  Patient denies hx of cancer, stroke, seizures, lung problem (except asthma and lung nodule being watched), major cardiac events (except clotting disorder), diabetes, unexplained weight loss, changes in bowel or bladder problems, new onset stumbling or dropping things apart from described below.    Limitations Lifting;Standing;Walking;House hold activities    Patient Stated Goals to get better    Currently in Pain? Yes    Pain Score 6      Pain Location Knee    Pain Orientation Right;Left    Pain Descriptors / Indicators Aching;Dull;Cramping    Pain Type Acute pain;Chronic pain    Pain Onset More than a month ago             TREATMENT:    Therapeutic exercise: to centralize symptoms and improve ROM, strength, muscular endurance, and activity tolerance required for successful completion of functional activities.  - Education on diagnosis, prognosis, POC, anatomy and physiology of current condition.    -bicycle to improve ROM, monitored throughout, adjusted as able to maximize knee flexion x 5 mins. Seat at level 12, initially very stiff, but pt able to perform full revolution with less complaints of stiffness -sidleying hip abduction 2x10 AW 2# - supine R active SLR 2 x 10  AW 2# -Seated heel slides  x10 x 3 sec holds Long arc quads bilaterally with 2# AW 2x15 - sit <> stand x 10 without UE support, chair + pad x10 -R heel slides 2x20 seated Leg press RLE only x10 at 10# x10 at 20#    Pt response/clinical impression: Patient tolerated session well, difficulty with sit <> stands from standard surface level without UE. Able to complete with chair + pad height, but did cite increased R knee pain with exercise that gradually decreased again with rest. Leg press initiated, pt able to progress to 20# with single leg press. The patient would benefit from further skilled PT intervention to continue to progress towards goals.     HOME EXERCISE PROGRAM  Access Code: [email or web address] URL: [email or web address] Date: 09/14/2020 Prepared by: Norton Blizzard   Exercises Supine Active Straight Leg Raise - 1 x daily - 3 sets - 10 reps Sit to Stand with Armchair - 1 x daily - 3 sets - 5-10 reps     PT Education - 09/22/20 1732    Education Details exercise form/technique    Person(s) Educated Patient    Methods Explanation;Demonstration;Tactile cues;Verbal cues    Comprehension Verbalized understanding;Returned  demonstration;Verbal cues required;Tactile cues required;Need further instruction            PT Short Term Goals - 09/14/20 1245      PT SHORT TERM GOAL #1   Title Be independent with initial home exercise program for self-management of symptoms.    Baseline initial HEP provided at IE (09/14/2020);    Time 3    Period Weeks    Status New    Target Date 10/05/20             PT Long Term Goals - 09/20/20 1817      PT LONG TERM GOAL #1   Title Be independent with a long-term home exercise program for self-management of symptoms.    Baseline initial HEP provided at IE (09/14/2020);    Time 12    Period Weeks    Status New   TARGET DATE FOR  ALL LONG TERM GOALS: 12/07/2020     PT LONG TERM GOAL #2   Title Patient will complete 5 Time Sit To Stand Test in equal or less than 12 seconds from 18.5 inch plinth with no UE support to improve ability to move around at home and at work while carrying items.    Baseline 13 seconds from 18.5 inch plinth to RW with B UE support on plinth (09/14/2020);    Time 12    Period Weeks    Status New      PT LONG TERM GOAL #3   Title Demonstrate improved FOTO score to equal or greater than 41 by visit #15 to demonstrate improvement in overall condition and self-reported functional ability.    Baseline 32 (09/14/2020);    Time 12    Period Weeks    Status New      PT LONG TERM GOAL #4   Title Patient will demonstrate R knee PROM 0 - 120 to improve her ability to navigate over obstacles in her community without difficulty.    Baseline 0-103 (09/14/2020);    Time 12    Period Weeks    Status New      PT LONG TERM GOAL #5   Title Patient will ambulate equal or greater than 1000 feet using LRAD on the 6 Minute Walk Test to demonstrate improved community mobility.    Baseline to be tested session 2 as appropriate (09/14/2020); 09/14/2020 73ft    Time 12    Period Weeks    Status New      PT LONG TERM GOAL #6   Title Complete community, work  and/or recreational activities without limitation due to current condition.    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 Able to complete with chair + pad height, but did cite increased R knee pain with exercise that gradually decreased again with rest. Leg press initiated, pt able to progress to 20# with single leg press. The patient would benefit from further skilled PT intervention to continue to progress towards goals.    Personal Factors and Comorbidities Age;Comorbidity 3+;Past/Current Experience;Fitness;Time since onset of injury/illness/exacerbation;Transportation    Comorbidities Anxiety, Arthritis, Bipolar disorder, Depression, Fibromyalgia, Migraines,Had an abdominal hysterectomy, c-section, B hammer-toe repair, gastric bypass (needs revision), hernia repair, hip-surgery (labral repair), B knee surgery to regenerate meniscus, B oophorectomy, B shoulder surgeries for rotator cuff repair in 2012 and 2016, tonsillectomy, and  tubal ligation, obesity.    Examination-Activity Limitations Lift;Stairs;Continence;Bend;Locomotion Level;Bathing;Bed Mobility;Squat;Stand;Caring for Others;Carry;Self Feeding;Dressing;Sleep;Transfers    Examination-Participation Restrictions Yard Work;Cleaning;Laundry;Community Activity;Driving;Occupation;Interpersonal Relationship;Meal Prep;Shop    Stability/Clinical Decision Making Evolving/Moderate complexity    Rehab Potential Good    PT Frequency 2x / week    PT Duration 12 weeks    PT Treatment/Interventions ADLs/Self Care Home Management;Moist  Heat;Electrical Stimulation;Therapeutic activities;Functional mobility training;Stair training;Gait training;Therapeutic exercise;Balance training;Neuromuscular re-education;Patient/family education;Manual techniques;Dry needling;Taping;Spinal Manipulations;Joint Manipulations;Cryotherapy;Passive range of motion;Scar mobilization;Energy conservation;Manual lymph drainage    PT Next Visit Plan progress HEP as appropriate  PT Home Exercise Plan Medbridge Access Code: [email or web address]    Consulted and Agree with Plan of Care Patient           Patient will benefit from skilled therapeutic intervention in order to improve the following deficits and impairments:  Abnormal gait,Decreased endurance,Difficulty walking,Obesity,Improper body mechanics,Pain,Postural dysfunction,Decreased strength,Decreased coordination,Decreased activity tolerance,Decreased skin integrity,Decreased scar mobility,Decreased mobility,Prosthetic Dependency,Impaired perceived functional ability,Hypomobility,Decreased range of motion,Decreased balance,Increased edema,Impaired flexibility  Visit Diagnosis: Chronic pain of right knee  Other idiopathic scoliosis, thoracolumbar region  Stiffness of right knee, not elsewhere classified  Abnormal posture  Other muscle spasm  Muscle weakness (generalized)     Problem List Patient Active Problem List   Diagnosis Date Noted  . Status post total knee replacement, right 08/22/2020  . Primary osteoarthritis of right knee 08/21/2020  . Leg edema 06/22/2019  . Essential hypertension 06/22/2019  . Fatigue 06/22/2019    Lieutenant Diego PT, DPT 6:14 PM,09/22/20   [facility] [address], [place], [address] Phone: [phone]   Fax:  [phone]  Name: Kimberly Herring MRN: [id number] Date of Birth: 1966/06/03

## 2020-09-26 ENCOUNTER — Encounter: Payer: Self-pay | Admitting: Physical Therapy

## 2020-09-26 ENCOUNTER — Other Ambulatory Visit: Payer: Self-pay

## 2020-09-26 ENCOUNTER — Ambulatory Visit: Payer: BC Managed Care – PPO | Attending: Obstetrics and Gynecology | Admitting: Physical Therapy

## 2020-09-26 DIAGNOSIS — R278 Other lack of coordination: Secondary | ICD-10-CM | POA: Insufficient documentation

## 2020-09-26 DIAGNOSIS — M25561 Pain in right knee: Secondary | ICD-10-CM | POA: Diagnosis present

## 2020-09-26 DIAGNOSIS — M62838 Other muscle spasm: Secondary | ICD-10-CM | POA: Diagnosis present

## 2020-09-26 DIAGNOSIS — M4125 Other idiopathic scoliosis, thoracolumbar region: Secondary | ICD-10-CM | POA: Diagnosis present

## 2020-09-26 DIAGNOSIS — R293 Abnormal posture: Secondary | ICD-10-CM | POA: Diagnosis present

## 2020-09-26 DIAGNOSIS — M6281 Muscle weakness (generalized): Secondary | ICD-10-CM | POA: Diagnosis present

## 2020-09-26 DIAGNOSIS — M25661 Stiffness of right knee, not elsewhere classified: Secondary | ICD-10-CM | POA: Insufficient documentation

## 2020-09-26 DIAGNOSIS — G8929 Other chronic pain: Secondary | ICD-10-CM | POA: Insufficient documentation

## 2020-09-26 DIAGNOSIS — Z9181 History of falling: Secondary | ICD-10-CM | POA: Diagnosis present

## 2020-09-26 DIAGNOSIS — R262 Difficulty in walking, not elsewhere classified: Secondary | ICD-10-CM | POA: Insufficient documentation

## 2020-09-26 NOTE — Therapy (Signed)
[facility] PHYSICAL AND SPORTS MEDICINE [address], [place], [address] Phone: [phone]   Fax:  [phone]  Physical Therapy Treatment  Patient Details  Name: Kimberly Herring MRN: [id number] Date of Birth: Nov 03, 1965 Referring Provider (PT): Naiping M. Roda Shutters, MD   Encounter Date: 09/26/2020   PT End of Session - 09/26/20 1456    Visit Number 4    Number of Visits 24    Date for PT Re-Evaluation 12/07/20    Authorization Type [employer] reporting period from 09/14/2020    Authorization Time Period --    Progress Note Due on Visit 10    PT Start Time 1437    PT Stop Time 1517    PT Time Calculation (min) 40 min    Activity Tolerance Patient tolerated treatment well;Patient limited by pain    Behavior During Therapy [facility] for tasks assessed/performed           Past Medical History:  Diagnosis Date  . Anemia   . Anxiety   . Arthritis   . Asthma   . Bipolar 2 disorder (HCC)   . Complication of anesthesia    hard to put to sleep  . Depression   . Family history of adverse reaction to anesthesia    Mother is hard to wake up  . Fibromyalgia   . Hypertension   . Hypoglycemia following gastrointestinal surgery    post-bariatric surgery hypoglycemia (followed at [facility])  . Lung nodules    lung nodules ~1999; diagnosed wtih "[employer]", reported follow-up imaging showed nodules had resolved but some "scar tissue" present  . Migraine aura, persistent   . 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 (Hematologist Dr. Kerry Dory, MD at [facility])  . Pneumonia   . Undifferentiated connective tissue disease [facility])    [facility]    Past Surgical History:  Procedure Laterality Date  . ABDOMINAL HYSTERECTOMY    . CESAREAN SECTION    . ESOPHAGOGASTRODUODENOSCOPY    . FOOT SURGERY Bilateral   . GASTRIC BYPASS    . HERNIA REPAIR    . HIP SURGERY    . KNEE SURGERY  Bilateral   . LAPAROSCOPIC OOPHERECTOMY    . SHOULDER SURGERY Bilateral   . TONSILLECTOMY    . TOTAL KNEE ARTHROPLASTY Right 08/22/2020   Procedure: RIGHT TOTAL KNEE ARTHROPLASTY;  Surgeon: Tarry Kos, MD;  Location: [facility] OR;  Service: Orthopedics;  Laterality: Right;  . TUBAL LIGATION      There were no vitals filed for this visit.   Subjective Assessment - 09/26/20 1441    Subjective Patient reports her right knee is hurting at the lateral aspect of the knee rated 4/10 upon arrival. States she twisted it getting up last Thursday evening after her PT session. States she felt a pop and sudden pain. It feels much better with the ace wrap she applied and hurts more when it is off. She wonders if PT could dry needle that spot because she has had dry needling in the past with good result.  She took some medication prior to PT. She felt okay following last treatment session.    Pertinent History Patient is a 55 y.o. female who presents to outpatient physical therapy with a referral for medical diagnosis s/p R TKA. 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   Relevant past medical history and comorbidities include platelet storage pool disease (doesn't clot quickly enough, carries a medication to take in case she needs it), fibromyalgia, asthma (has rescue inhaler), lung nodules (being followed), connective tissue disease, bipolar 2 with hallucinations, depression (sees mental health therapist weekly), arthritis, anemia, previous pelvic floor PT, hernia repair, hip surgery, bilateral knee surgery, bilateral shoulder surgery, hypertension, leg edema, fatigue. .  Patient denies hx of cancer,  stroke, seizures, lung problem (except asthma and lung nodule being watched), major cardiac events (except clotting disorder), diabetes, unexplained weight loss, changes in bowel or bladder problems, new onset stumbling or dropping things apart from described below.    Limitations Lifting;Standing;Walking;House hold activities    Patient Stated Goals to get better    Currently in Pain? Yes    Pain Score 4     Pain Location Knee    Pain Orientation Right;Lateral    Pain Onset More than a month ago           TREATMENT:  Therapeutic exercise:to centralize symptoms and improve ROM, strength, muscular endurance, and activity tolerance required for successful completion of functional activities.  -bicycle to improve ROM, monitored throughout, adjusted as able to maximize knee flexionx 5 mins at seat at level 12, x 3 min at seat level 10. Patient apprehensive about moving up but able to perform with good tolerance. Leg press 3x10 at 20# each side one leg at a time. (one hole showing on seat side).  - close chain AAROM R knee flexion on 2nd step on stair, 2x10 with 3 second hold. BUE support on railings.  - Long arc quads with 7.5# AW 3x10 each side - sit <> stand 2x10without UE support, 19.5/18.5 inch plinth. Touch down UE support on RW.  - inspection and palpation of R posteriolateral proximal triceps surae region where patient had the increased pain after getting up with a pop on Thursday night. Is tender to deep palpation but no excessive swelling, warmth. Unable to fully visualize due to bandage and pants with narrow cuff.    HOME EXERCISE PROGRAM Access Code: [email or web address] URL: [email or web address] Date: 09/14/2020 Prepared by: [name]  Exercises Supine Active Straight Leg Raise - 1 x daily - 3 sets - 10 reps Sit to Stand with Armchair - 1 x daily - 3 sets - 5-10 reps     PT Education - 09/26/20 1456    Education Details Exercise purpose/form. Self  management techniques.    Person(s) Educated Patient    Methods Explanation    Comprehension Verbalized understanding;Returned demonstration;Verbal cues required;Tactile cues required;Need further instruction            PT Short Term Goals - 09/14/20 1245      PT SHORT TERM GOAL #1   Title Be independent with initial home exercise program for self-management of symptoms.    Baseline initial HEP provided at IE (09/14/2020);    Time 3    Period Weeks    Status New    Target Date 10/05/20             PT Long Term Goals - 09/20/20 1817      PT LONG TERM GOAL #1   Title Be independent with a long-term home exercise program for self-management of symptoms.  Baseline initial HEP provided at IE (09/14/2020);    Time 12    Period Weeks    Status New   TARGET DATE FOR ALL LONG TERM GOALS: 12/07/2020     PT LONG TERM GOAL #2   Title Patient will complete 5 Time Sit To Stand Test in equal or less than 12 seconds from 18.5 inch plinth with no UE support to improve ability to move around at home and at work while carrying items.    Baseline 13 seconds from 18.5 inch plinth to RW with B UE support on plinth (09/14/2020);    Time 12    Period Weeks    Status New      PT LONG TERM GOAL #3   Title Demonstrate improved FOTO score to equal or greater than 41 by visit #15 to demonstrate improvement in overall condition and self-reported functional ability.    Baseline 32 (09/14/2020);    Time 12    Period Weeks    Status New      PT LONG TERM GOAL #4   Title Patient will demonstrate R knee PROM 0 - 120 to improve her ability to navigate over obstacles in her community without difficulty.    Baseline 0-103 (09/14/2020);    Time 12    Period Weeks    Status New      PT LONG TERM GOAL #5   Title Patient will ambulate equal or greater than 1000 feet using LRAD on the 6 Minute Walk Test to demonstrate improved community mobility.    Baseline to be tested session 2 as appropriate  (09/14/2020); 09/14/2020 76ft    Time 12    Period Weeks    Status New      PT LONG TERM GOAL #6   Title Complete community, work and/or recreational activities without limitation due to current condition.    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 No palpable abnormality there and no excessive warmth, or tenderness. Able to progress to more advanced closed chain exercise and tolerate increased weight for long arc quad exercise. Would like to work at getting off the walker next session. Patient would benefit from continued management of limiting condition by skilled physical therapist to address remaining impairments and functional limitations to work towards stated goals and return to PLOF or maximal functional independence.    Personal Factors and Comorbidities Age;Comorbidity 3+;Past/Current Experience;Fitness;Time since onset of injury/illness/exacerbation;Transportation    Comorbidities Anxiety, Arthritis, Bipolar disorder, Depression, Fibromyalgia, Migraines,Had an abdominal hysterectomy, c-section, B hammer-toe repair, gastric bypass (needs revision), hernia repair, hip-surgery (labral repair), B knee surgery to regenerate meniscus, B oophorectomy, B shoulder surgeries for rotator cuff repair in 2012 and 2016, tonsillectomy, and  tubal ligation, obesity.    Examination-Activity Limitations Lift;Stairs;Continence;Bend;Locomotion Level;Bathing;Bed Mobility;Squat;Stand;Caring for Others;Carry;Self Feeding;Dressing;Sleep;Transfers    Examination-Participation Restrictions Yard  Work;Cleaning;Laundry;Community Activity;Driving;Occupation;Interpersonal Relationship;Meal Prep;Shop    Stability/Clinical Decision Making Evolving/Moderate complexity    Rehab Potential Good    PT Frequency 2x / week  PT Duration 12 weeks    PT Treatment/Interventions ADLs/Self Care Home Management;Moist Heat;Electrical Stimulation;Therapeutic activities;Functional mobility training;Stair training;Gait training;Therapeutic exercise;Balance training;Neuromuscular re-education;Patient/family education;Manual techniques;Dry needling;Taping;Spinal Manipulations;Joint Manipulations;Cryotherapy;Passive range of motion;Scar mobilization;Energy conservation;Manual lymph drainage    PT Next Visit Plan progress HEP as appropriate    PT Home Exercise Plan Medbridge Access Code: [email or web address]    Consulted and Agree with Plan of Care Patient           Patient will benefit from skilled therapeutic intervention in order to improve the following deficits and impairments:  Abnormal gait,Decreased endurance,Difficulty walking,Obesity,Improper body mechanics,Pain,Postural dysfunction,Decreased strength,Decreased coordination,Decreased activity tolerance,Decreased skin integrity,Decreased scar mobility,Decreased mobility,Prosthetic Dependency,Impaired perceived functional ability,Hypomobility,Decreased range of motion,Decreased balance,Increased edema,Impaired flexibility  Visit Diagnosis: Chronic pain of right knee  Stiffness of right knee, not elsewhere classified  Muscle weakness (generalized)  Difficulty in walking, not elsewhere classified  History of falling     Problem List Patient Active Problem List   Diagnosis Date Noted  . Status post total knee replacement, right 08/22/2020  . Primary osteoarthritis of right knee 08/21/2020  . Leg edema 06/22/2019  . Essential hypertension 06/22/2019  . Fatigue 06/22/2019   Everlean Alstrom. Graylon Good, PT, DPT 09/26/20, 4:18 PM  [facility] PHYSICAL AND SPORTS MEDICINE [address], [place], [address] Phone: [phone]   Fax:  [phone]  Name: Kimberly Herring MRN: [id number] Date of Birth: [birth year]

## 2020-09-27 ENCOUNTER — Encounter: Payer: Self-pay | Admitting: Orthopaedic Surgery

## 2020-09-28 ENCOUNTER — Other Ambulatory Visit: Payer: Self-pay

## 2020-09-28 ENCOUNTER — Encounter: Payer: Self-pay | Admitting: Physical Therapy

## 2020-09-28 ENCOUNTER — Ambulatory Visit: Payer: BC Managed Care – PPO | Admitting: Physical Therapy

## 2020-09-28 DIAGNOSIS — R262 Difficulty in walking, not elsewhere classified: Secondary | ICD-10-CM

## 2020-09-28 DIAGNOSIS — Z9181 History of falling: Secondary | ICD-10-CM

## 2020-09-28 DIAGNOSIS — G8929 Other chronic pain: Secondary | ICD-10-CM

## 2020-09-28 DIAGNOSIS — M6281 Muscle weakness (generalized): Secondary | ICD-10-CM

## 2020-09-28 DIAGNOSIS — M25661 Stiffness of right knee, not elsewhere classified: Secondary | ICD-10-CM

## 2020-09-28 DIAGNOSIS — M25561 Pain in right knee: Secondary | ICD-10-CM | POA: Diagnosis not present

## 2020-09-28 NOTE — Therapy (Signed)
[place] [facility] [address] [place], [address] Phone: [phone]   Fax:  [phone]  Physical Therapy Treatment  Patient Details  Name: Kimberly Herring MRN: [id number] Date of Birth: [birth year] Referring Provider (PT): Naiping M. Erlinda Hong, MD   Encounter Date: 09/28/2020   PT End of Session - 09/28/20 1453    Visit Number 5    Number of Visits 24    Date for PT Re-Evaluation 12/07/20    Authorization Type [employer] reporting period from 09/14/2020    Progress Note Due on Visit 10    PT Start Time 1443    PT Stop Time 1515    PT Time Calculation (min) 32 min    Activity Tolerance Patient tolerated treatment well;Patient limited by pain    Behavior During Therapy [facility] for tasks assessed/performed           Past Medical History:  Diagnosis Date  . Anemia   . Anxiety   . Arthritis   . Asthma   . Bipolar 2 disorder ([place])   . Complication of anesthesia    hard to put to sleep  . Depression   . Family history of adverse reaction to anesthesia    Mother is hard to wake up  . Fibromyalgia   . Hypertension   . Hypoglycemia following gastrointestinal surgery    post-bariatric surgery hypoglycemia (followed at [facility])  . Lung nodules    lung nodules ~1999; diagnosed wtih "Legionnaires", reported follow-up imaging showed nodules had resolved but some "scar tissue" present  . Migraine aura, persistent   . 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 (Hematologist Dr. Sterling Big, MD at [facility])  . Pneumonia   . Undifferentiated connective tissue disease [facility])    [facility]    Past Surgical History:  Procedure Laterality Date  . ABDOMINAL HYSTERECTOMY    . CESAREAN SECTION    . ESOPHAGOGASTRODUODENOSCOPY    . FOOT SURGERY Bilateral   . GASTRIC BYPASS    . HERNIA REPAIR    . HIP SURGERY    . KNEE SURGERY Bilateral   . LAPAROSCOPIC OOPHERECTOMY     . SHOULDER SURGERY Bilateral   . TONSILLECTOMY    . TOTAL KNEE ARTHROPLASTY Right 08/22/2020   Procedure: RIGHT TOTAL KNEE ARTHROPLASTY;  Surgeon: Leandrew Koyanagi, MD;  Location: [place];  Service: Orthopedics;  Laterality: Right;  . TUBAL LIGATION      There were no vitals filed for this visit.   Subjective Assessment - 09/28/20 1448    Subjective Patient reports the spot on her right leg just distal and lateral to her R knee is still really bothering her and no better. She states she is keeping it wraped in an ace bandage that helped it feel better. She states she had more pain following last treatment session than previously but it was acceptable to her and she used her ice and medications to control it and was able to sleep okay. She rates her pain at the lateral distal R knee as 4/10 but it goes up when she flexes her knee. Her CPM machine was picked up tomorrow.    Pertinent History Patient is a 55 y.o. female who presents to outpatient physical therapy with a referral for medical diagnosis s/p R TKA. 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   Relevant past medical history and comorbidities include platelet storage pool disease (doesn't clot quickly enough, carries a medication to take in case she needs it), fibromyalgia, asthma (has rescue inhaler), lung nodules (being followed), connective tissue disease, bipolar 2 with hallucinations, depression (sees mental health therapist weekly), arthritis, anemia, previous pelvic floor PT, hernia repair, hip surgery, bilateral knee surgery, bilateral shoulder surgery, hypertension, leg edema, fatigue. .  Patient denies hx of cancer, stroke,  seizures, lung problem (except asthma and lung nodule being watched), major cardiac events (except clotting disorder), diabetes, unexplained weight loss, changes in bowel or bladder problems, new onset stumbling or dropping things apart from described below.    Limitations Lifting;Standing;Walking;House hold activities    Patient Stated Goals to get better    Currently in Pain? Yes    Pain Score 4     Pain Onset More than a month ago            TREATMENT:  Therapeutic exercise:to centralize symptoms and improve ROM, strength, muscular endurance, and activity tolerance required for successful completion of functional activities.  -bicycle to improve ROM, monitored throughout, adjusted as able to maximize knee flexionx 5 mins at seat at level 12, x 2 min at seat level 10. Stiff and painful at first but calmed down with time.  - ambulation with CGA and SPC held in L UE x 5 feet. Step to gait pattern. Very careful and reports increased pain with increased weight bearing on R knee.  - ambulation 150 feet with RW and attempting to offload weight through BUE. Challenging for patient. Discussed how to incorporate practice into home activities in preparation for transitioning to cane or then less restrictive assistive device.  - Leg press 3x10 at 25# each side one leg at a time. (one hole showing on seat side).   Pt required multimodal cuing for proper technique and to facilitate improved neuromuscular control, strength, range of motion, and functional ability resulting in improved performance and form.   HOME EXERCISE PROGRAM Access Code: [email or web address] URL: [email or web address] Date: 09/14/2020 Prepared by: Norton Blizzard  Exercises Supine Active Straight Leg Raise - 1 x daily - 3 sets - 10 reps Sit to Stand with Armchair - 1 x daily - 3 sets - 5-10 reps    PT Education - 09/28/20 1451    Education Details Exercise purpose/form. Self management techniques.    Person(s)  Educated Patient    Methods Explanation;Demonstration;Tactile cues;Verbal cues    Comprehension Verbalized understanding;Returned demonstration;Verbal cues required;Tactile cues required;Need further instruction            PT Short Term Goals - 09/14/20 1245      PT SHORT TERM GOAL #1   Title Be independent with initial home exercise program for self-management of symptoms.    Baseline initial HEP provided at IE (09/14/2020);    Time 3    Period Weeks    Status New    Target Date 10/05/20             PT Long Term Goals - 09/20/20 1817      PT LONG TERM GOAL #1   Title Be independent with a long-term home exercise program for self-management of symptoms.    Baseline initial HEP provided at IE (09/14/2020);  Time 12    Period Weeks    Status New   TARGET DATE FOR ALL LONG TERM GOALS: 12/07/2020     PT LONG TERM GOAL #2   Title Patient will complete 5 Time Sit To Stand Test in equal or less than 12 seconds from 18.5 inch plinth with no UE support to improve ability to move around at home and at work while carrying items.    Baseline 13 seconds from 18.5 inch plinth to RW with B UE support on plinth (09/14/2020);    Time 12    Period Weeks    Status New      PT LONG TERM GOAL #3   Title Demonstrate improved FOTO score to equal or greater than 41 by visit #15 to demonstrate improvement in overall condition and self-reported functional ability.    Baseline 32 (09/14/2020);    Time 12    Period Weeks    Status New      PT LONG TERM GOAL #4   Title Patient will demonstrate R knee PROM 0 - 120 to improve her ability to navigate over obstacles in her community without difficulty.    Baseline 0-103 (09/14/2020);    Time 12    Period Weeks    Status New      PT LONG TERM GOAL #5   Title Patient will ambulate equal or greater than 1000 feet using LRAD on the 6 Minute Walk Test to demonstrate improved community mobility.    Baseline to be tested session 2 as appropriate  (09/14/2020); 09/14/2020 750ft    Time 12    Period Weeks    Status New      PT LONG TERM GOAL #6   Title Complete community, work and/or recreational activities without limitation due to current condition.    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 Advised her to contact her physician and see if he would like to see her sooner than previously planned. No signs of gross instability and pain is better with continued gentle exercise. Patient appears to want to progress to needling less restrictive assistive device so attempted to ambulate with SPC. Patient reported increased discomfort at R knee with increase wieght bearing after walking for ~ 5 feet. She felt more comfortable workiing on increased LE weight bearing and less off loading with the UEs while using the RW but did ambulate very slowly and felt apprehensive about going 200 feet so it was discontinued at 150 feet. Patient able to increase weight on leg press. Session was not as long as usual due to patient being 13 minutes late. Patient would benefit from continued management of limiting condition by skilled physical therapist to address remaining impairments and functional limitations to work towards stated goals and return to PLOF or maximal functional independence.    Personal Factors and Comorbidities Age;Comorbidity 3+;Past/Current Experience;Fitness;Time since onset of injury/illness/exacerbation;Transportation    Comorbidities Anxiety, Arthritis, Bipolar disorder, Depression, Fibromyalgia,  Migraines,Had an  abdominal hysterectomy, c-section, B hammer-toe repair, gastric bypass (needs revision), hernia repair, hip-surgery (labral repair), B knee surgery to regenerate meniscus, B oophorectomy, B shoulder surgeries for rotator cuff repair in 2012 and 2016, tonsillectomy, and  tubal ligation, obesity.    Examination-Activity Limitations Lift;Stairs;Continence;Bend;Locomotion Level;Bathing;Bed Mobility;Squat;Stand;Caring for Others;Carry;Self Feeding;Dressing;Sleep;Transfers    Examination-Participation Restrictions Yard Work;Cleaning;Laundry;Community Activity;Driving;Occupation;Interpersonal Relationship;Meal Prep;Shop    Stability/Clinical Decision Making Evolving/Moderate complexity    Rehab Potential Good    PT Frequency 2x / week    PT Duration 12 weeks    PT Treatment/Interventions ADLs/Self Care Home Management;Moist Heat;Electrical Stimulation;Therapeutic activities;Functional mobility training;Stair training;Gait training;Therapeutic exercise;Balance training;Neuromuscular re-education;Patient/family education;Manual techniques;Dry needling;Taping;Spinal Manipulations;Joint Manipulations;Cryotherapy;Passive range of motion;Scar mobilization;Energy conservation;Manual lymph drainage    PT Next Visit Plan progress HEP as appropriate    PT Home Exercise Plan Medbridge Access Code: [email or web address]    Consulted and Agree with Plan of Care Patient           Patient will benefit from skilled therapeutic intervention in order to improve the following deficits and impairments:  Abnormal gait,Decreased endurance,Difficulty walking,Obesity,Improper body mechanics,Pain,Postural dysfunction,Decreased strength,Decreased coordination,Decreased activity tolerance,Decreased skin integrity,Decreased scar mobility,Decreased mobility,Prosthetic Dependency,Impaired perceived functional ability,Hypomobility,Decreased range of motion,Decreased balance,Increased edema,Impaired flexibility  Visit  Diagnosis: Chronic pain of right knee  Stiffness of right knee, not elsewhere classified  Muscle weakness (generalized)  Difficulty in walking, not elsewhere classified  History of falling     Problem List Patient Active Problem List   Diagnosis Date Noted  . Status post total knee replacement, right 08/22/2020  . Primary osteoarthritis of right knee 08/21/2020  . Leg edema 06/22/2019  . Essential hypertension 06/22/2019  . Fatigue 06/22/2019    Luretha Murphy. Ilsa Iha, PT, DPT 09/28/20, 7:08 PM  [facility] [address] [place], [address] Phone: [phone]   Fax:  [phone]  Name: Kimberly Herring MRN: [id number] Date of Birth: [birth year]

## 2020-10-03 ENCOUNTER — Ambulatory Visit: Payer: BC Managed Care – PPO | Admitting: Physical Therapy

## 2020-10-04 ENCOUNTER — Encounter: Payer: BC Managed Care – PPO | Admitting: Physical Therapy

## 2020-10-05 ENCOUNTER — Ambulatory Visit: Payer: BC Managed Care – PPO | Admitting: Orthopaedic Surgery

## 2020-10-06 ENCOUNTER — Encounter: Payer: BC Managed Care – PPO | Admitting: Physical Therapy

## 2020-10-10 ENCOUNTER — Ambulatory Visit: Payer: BC Managed Care – PPO | Admitting: Physical Therapy

## 2020-10-12 ENCOUNTER — Other Ambulatory Visit: Payer: Self-pay

## 2020-10-12 ENCOUNTER — Ambulatory Visit (INDEPENDENT_AMBULATORY_CARE_PROVIDER_SITE_OTHER): Payer: BC Managed Care – PPO | Admitting: Orthopaedic Surgery

## 2020-10-12 ENCOUNTER — Encounter: Payer: Self-pay | Admitting: Orthopaedic Surgery

## 2020-10-12 ENCOUNTER — Ambulatory Visit: Payer: BC Managed Care – PPO

## 2020-10-12 ENCOUNTER — Ambulatory Visit (INDEPENDENT_AMBULATORY_CARE_PROVIDER_SITE_OTHER): Payer: BC Managed Care – PPO

## 2020-10-12 DIAGNOSIS — R278 Other lack of coordination: Secondary | ICD-10-CM

## 2020-10-12 DIAGNOSIS — M25661 Stiffness of right knee, not elsewhere classified: Secondary | ICD-10-CM

## 2020-10-12 DIAGNOSIS — M25561 Pain in right knee: Secondary | ICD-10-CM | POA: Diagnosis not present

## 2020-10-12 DIAGNOSIS — Z96651 Presence of right artificial knee joint: Secondary | ICD-10-CM

## 2020-10-12 DIAGNOSIS — Z9181 History of falling: Secondary | ICD-10-CM

## 2020-10-12 DIAGNOSIS — R293 Abnormal posture: Secondary | ICD-10-CM

## 2020-10-12 DIAGNOSIS — M62838 Other muscle spasm: Secondary | ICD-10-CM

## 2020-10-12 DIAGNOSIS — M4125 Other idiopathic scoliosis, thoracolumbar region: Secondary | ICD-10-CM

## 2020-10-12 DIAGNOSIS — R262 Difficulty in walking, not elsewhere classified: Secondary | ICD-10-CM

## 2020-10-12 DIAGNOSIS — G8929 Other chronic pain: Secondary | ICD-10-CM

## 2020-10-12 DIAGNOSIS — M6281 Muscle weakness (generalized): Secondary | ICD-10-CM

## 2020-10-12 NOTE — Therapy (Signed)
[place] [facility] [address] [place], [address] Phone: [phone]   Fax:  [phone]  Physical Therapy Treatment  Patient Details  Name: Kimberly Herring MRN: [id number] Date of Birth: October 08, 1965 Referring Provider (PT): Naiping M. Erlinda Hong, MD   Encounter Date: 10/12/2020   PT End of Session - 10/12/20 1122    Visit Number 5    Number of Visits 24    Date for PT Re-Evaluation 12/07/20    Authorization Type [employer] reporting period from 09/14/2020    Authorization Time Period 06/16/20 through 08/25/20    Authorization - Visit Number 4    Authorization - Number of Visits 10    Progress Note Due on Visit 10    PT Start Time 1116    PT Stop Time 1156    PT Time Calculation (min) 40 min    Activity Tolerance Patient tolerated treatment well;Patient limited by pain    Behavior During Therapy [facility] for tasks assessed/performed           Past Medical History:  Diagnosis Date  . Anemia   . Anxiety   . Arthritis   . Asthma   . Bipolar 2 disorder ([place])   . Complication of anesthesia    hard to put to sleep  . Depression   . Family history of adverse reaction to anesthesia    Mother is hard to wake up  . Fibromyalgia   . Hypertension   . Hypoglycemia following gastrointestinal surgery    post-bariatric surgery hypoglycemia (followed at [facility])  . Lung nodules    lung nodules ~1999; diagnosed wtih "Legionnaires", reported follow-up imaging showed nodules had resolved but some "scar tissue" present  . Migraine aura, persistent   . 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 (Hematologist [facility]. Sterling Big, [facility] at [facility])  . Pneumonia   . Undifferentiated connective tissue disease [facility])    [facility]    Past Surgical History:  Procedure Laterality Date  . ABDOMINAL HYSTERECTOMY    . CESAREAN SECTION    . ESOPHAGOGASTRODUODENOSCOPY    . FOOT  SURGERY Bilateral   . GASTRIC BYPASS    . HERNIA REPAIR    . HIP SURGERY    . KNEE SURGERY Bilateral   . LAPAROSCOPIC OOPHERECTOMY    . SHOULDER SURGERY Bilateral   . TONSILLECTOMY    . TOTAL KNEE ARTHROPLASTY Right 08/22/2020   Procedure: RIGHT TOTAL KNEE ARTHROPLASTY;  Surgeon: Leandrew Koyanagi, MD;  Location: [place];  Service: Orthopedics;  Laterality: Right;  . TUBAL LIGATION      There were no vitals filed for this visit.   Subjective Assessment - 10/12/20 1120    Subjective Pt reports lots of pain in both knees last night. She saw the orthopedst this morning. Pt reports difficulty with keeping up with her HEP last 2 weeks while sick with COVID19, this is her first visit back since being sick.    Pertinent History Patient is a 55 y.o. female who presents to outpatient physical therapy with a referral for medical diagnosis s/p R TKA. 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   Relevant past medical history and comorbidities include platelet storage pool disease (doesn't clot quickly enough, carries a medication to take in case she needs it), fibromyalgia, asthma (has rescue inhaler), lung nodules (being followed), connective tissue disease, bipolar 2 with hallucinations, depression (sees mental health therapist weekly), arthritis, anemia, previous pelvic floor PT, hernia repair, hip surgery, bilateral knee surgery, bilateral shoulder surgery, hypertension, leg edema, fatigue. .  Patient denies hx of cancer, stroke, seizures, lung problem (except asthma and lung nodule being watched), major cardiac events (except clotting disorder), diabetes, unexplained weight loss, changes in bowel  or bladder problems, new onset stumbling or dropping things apart from described below.    Limitations Lifting;Standing;Walking;House hold activities    Patient Stated Goals to get better    Currently in Pain? Yes    Pain Score 5     Pain Location --   surgical knee          INTERVENTION THIS DATE:  -AA/ROM using recumbent bike 5 minutes seat #12, then 2 minutes seat #11 (anxious about dropping to #10 this date) *very stiff, painful this date, but eases up after 60sec -Leg press 3x10 at 25# each side one leg at a time. (one hole showing on seat side).  -Overground AMB c RW (excellent symmetry) 0.49m/s  - close chain AAROM R knee flexion on 2nd step on stair, 2x10 with 3 second hold. BUE support on railings.  - Long arc quads with 7.5# AW 3x10 each side -Rt SLS c LLE toe taps (BUE supported on rails as needed) to improve single limb stability  *tapping 2nd step 2x15  -Lateral sidestep with blue TB at knees, 1x20 alternating sides, BUE support on treadmill  -standing resisted knee flexion 1x15 bilat  -standing bilat heel raise 1x20 in tandem   *good control on TKE in single limb stnace phase during latter exercises     PT Short Term Goals - 09/14/20 1245      PT SHORT TERM GOAL #1   Title Be independent with initial home exercise program for self-management of symptoms.    Baseline initial HEP provided at IE (09/14/2020);    Time 3    Period Weeks    Status New    Target Date 10/05/20             PT Long Term Goals - 09/20/20 1817      PT LONG TERM GOAL #1   Title Be independent with a long-term home exercise program for self-management of symptoms.    Baseline initial HEP provided at IE (09/14/2020);    Time 12    Period Weeks    Status New   TARGET DATE FOR ALL LONG TERM GOALS: 12/07/2020     PT LONG TERM GOAL #2   Title Patient will complete 5 Time Sit To Stand Test in equal or less than 12 seconds from 18.5 inch plinth with no UE support to improve ability to move  around at home and at work while carrying items.    Baseline 13 seconds from 18.5 inch plinth to RW with B UE support on plinth (09/14/2020);    Time 12    Period Weeks    Status New      PT LONG TERM GOAL #3   Title Demonstrate improved FOTO score to equal or greater than 41 by visit #15 to demonstrate improvement in overall condition and self-reported functional ability.    Baseline 32 (09/14/2020);    Time  12    Period Weeks    Status New      PT LONG TERM GOAL #4   Title Patient will demonstrate R knee PROM 0 - 120 to improve her ability to navigate over obstacles in her community without difficulty.    Baseline 0-103 (09/14/2020);    Time 12    Period Weeks    Status New      PT LONG TERM GOAL #5   Title Patient will ambulate equal or greater than 1000 feet using LRAD on the 6 Minute Walk Test to demonstrate improved community mobility.    Baseline to be tested session 2 as appropriate (09/14/2020); 09/14/2020 719ft    Time 12    Period Weeks    Status New      PT LONG TERM GOAL #6   Title Complete community, work and/or recreational activities without limitation due to current condition.    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 Pt remains motivated to advance progress toward goals. Rest breaks provided as needed, pt quick to ask when needed. Author maintains all interventions within appropriate level of intensity as not to purposefully exacerbate pain. Pt does require varying levels of assistance and cuing for completion of exercises for correct form and sometimes  due to pain/weakness. Pt continues to demonstrate progress toward goals AEB progression of some interventions this date either in volume or intensity. No updates to HEP this date.    Personal Factors and Comorbidities Age;Comorbidity 3+;Past/Current Experience;Fitness;Time since onset of injury/illness/exacerbation;Transportation    Comorbidities Anxiety, Arthritis, Bipolar disorder, Depression, Fibromyalgia, Migraines,Had an abdominal hysterectomy, c-section, B hammer-toe repair, gastric bypass (needs revision), hernia repair, hip-surgery (labral repair), B knee surgery to regenerate meniscus, B oophorectomy, B shoulder surgeries for rotator cuff repair in 2012 and 2016, tonsillectomy, and  tubal ligation, obesity.    Examination-Activity Limitations Lift;Stairs;Continence;Bend;Locomotion Level;Bathing;Bed Mobility;Squat;Stand;Caring for Others;Carry;Self Feeding;Dressing;Sleep;Transfers    Examination-Participation Restrictions Yard Work;Cleaning;Laundry;Community Activity;Driving;Occupation;Interpersonal Relationship;Meal Prep;Shop    Stability/Clinical Decision Making Evolving/Moderate complexity    Clinical Decision Making Moderate    Rehab Potential Good    PT Frequency 2x / week    PT Duration 12 weeks    PT Treatment/Interventions ADLs/Self Care Home Management;Moist Heat;Electrical Stimulation;Therapeutic activities;Functional mobility training;Stair training;Gait training;Therapeutic exercise;Balance training;Neuromuscular re-education;Patient/family education;Manual techniques;Dry needling;Taping;Spinal Manipulations;Joint Manipulations;Cryotherapy;Passive range of motion;Scar mobilization;Energy conservation;Manual lymph drainage    PT Next Visit Plan progress HEP as appropriate    PT Home Exercise Plan Medbridge Access Code: [email or web address]    Consulted and Agree with Plan of Care Patient           Patient will benefit from skilled therapeutic intervention in order to improve the following  deficits and impairments:  Abnormal gait,Decreased endurance,Difficulty walking,Obesity,Improper body mechanics,Pain,Postural dysfunction,Decreased strength,Decreased coordination,Decreased activity tolerance,Decreased skin integrity,Decreased scar mobility,Decreased mobility,Prosthetic Dependency,Impaired perceived functional ability,Hypomobility,Decreased range of motion,Decreased balance,Increased edema,Impaired flexibility  Visit Diagnosis: Chronic pain of right knee  Stiffness of right knee, not elsewhere classified  Muscle weakness (generalized)  Difficulty in walking, not  elsewhere classified  History of falling  Other idiopathic scoliosis, thoracolumbar region  Abnormal posture  Other muscle spasm  Other lack of coordination     Problem List Patient Active Problem List   Diagnosis Date Noted  . Status post total knee replacement, right 08/22/2020  . Primary osteoarthritis of right knee 08/21/2020  . Leg edema 06/22/2019  . Essential hypertension 06/22/2019  . Fatigue 06/22/2019   11:43 AM, 10/12/20 Etta Grandchild, PT, DPT Physical Therapist - [place] [phone] (Office)    Buccola,Allan C 10/12/2020, 11:25 AM  [place] [facility] [address], [place], [address] Phone: [phone]   Fax:  [phone]  Name: Kimberly Herring MRN: [id number] Date of Birth: 05-Dec-1965

## 2020-10-12 NOTE — Progress Notes (Addendum)
Post-Op Visit Note   Patient: Kimberly Herring           Date of Birth: 07/03/1966           MRN: [id number] Visit Date: 10/12/2020 PCP: Verdie Shire, MD   Assessment & Plan:  Chief Complaint:  Chief Complaint  Patient presents with  . Right Knee - Pain   Visit Diagnoses:  1. Status post total knee replacement, right     Plan:   [name] is a 7 weeks status post right total knee replacement.  Currently doing physical therapy twice a week in [place].  She has been out of work since surgery.  She feels like she is not ambulating as well as she would like.  No significant pain other than some mild discomfort with physical therapy.  She continues to functional loss that causes her to fatigue easily and to have difficulty with her job function.    Surgical scar of the right knee is fully healed.  She has full extension and about 95 degrees of flexion limited by her body habitus.  Stable to varus valgus stress.  X-rays demonstrate a total knee replacement in good position without any complications.  [name] is recovering well for this visit from my perspective.  Prophylaxis reinforced.  She will continue outpatient PT.  I do not believe that she is ready to return back to work given that she still needs additional physical therapy and she is just not ambulating as well as I would like her to be at this time.  She is having trouble with endurance and strength and needs more time to recover.  We will recheck her in 5 weeks.  Follow-Up Instructions: Return in about 5 weeks (around 11/16/2020).   Orders:  Orders Placed This Encounter  Procedures  . XR Knee 1-2 Views Right   No orders of the defined types were placed in this encounter.   Imaging: XR Knee 1-2 Views Right  Result Date: 10/12/2020 Stable total knee replacement in good alignment    PMFS History: Patient Active Problem List   Diagnosis Date Noted  . Status post total knee replacement, right 08/22/2020  . Primary  osteoarthritis of right knee 08/21/2020  . Leg edema 06/22/2019  . Essential hypertension 06/22/2019  . Fatigue 06/22/2019   Past Medical History:  Diagnosis Date  . Anemia   . Anxiety   . Arthritis   . Asthma   . Bipolar 2 disorder ([place])   . Complication of anesthesia    hard to put to sleep  . Depression   . Family history of adverse reaction to anesthesia    Mother is hard to wake up  . Fibromyalgia   . Hypertension   . Hypoglycemia following gastrointestinal surgery    post-bariatric surgery hypoglycemia (followed at [facility])  . Lung nodules    lung nodules ~1999; diagnosed wtih "Legionnaires", reported follow-up imaging showed nodules had resolved but some "scar tissue" present  . Migraine aura, persistent   . 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 (Hematologist Dr. Sterling Big, MD at [facility])  . Pneumonia   . Undifferentiated connective tissue disease [facility])    [facility]    History reviewed. No pertinent family history.  Past Surgical History:  Procedure Laterality Date  . ABDOMINAL HYSTERECTOMY    . CESAREAN SECTION    . ESOPHAGOGASTRODUODENOSCOPY    . FOOT SURGERY Bilateral   . GASTRIC BYPASS    .  HERNIA REPAIR    . HIP SURGERY    . KNEE SURGERY Bilateral   . LAPAROSCOPIC OOPHERECTOMY    . SHOULDER SURGERY Bilateral   . TONSILLECTOMY    . TOTAL KNEE ARTHROPLASTY Right 08/22/2020   Procedure: RIGHT TOTAL KNEE ARTHROPLASTY;  Surgeon: Leandrew Koyanagi, MD;  Location: [place];  Service: Orthopedics;  Laterality: Right;  . TUBAL LIGATION     Social History   Occupational History  . Not on file  Tobacco Use  . Smoking status: Never Smoker  . Smokeless tobacco: Never Used  Vaping Use  . Vaping Use: Never used  Substance and Sexual Activity  . Alcohol use: Yes    Comment: socially  . Drug use: No  . Sexual activity: Not on file

## 2020-10-17 ENCOUNTER — Ambulatory Visit: Payer: BC Managed Care – PPO | Admitting: Physical Therapy

## 2020-10-18 ENCOUNTER — Other Ambulatory Visit: Payer: Self-pay

## 2020-10-18 ENCOUNTER — Encounter: Payer: Self-pay | Admitting: Physical Therapy

## 2020-10-18 ENCOUNTER — Ambulatory Visit: Payer: BC Managed Care – PPO | Admitting: Physical Therapy

## 2020-10-18 DIAGNOSIS — Z9181 History of falling: Secondary | ICD-10-CM

## 2020-10-18 DIAGNOSIS — R262 Difficulty in walking, not elsewhere classified: Secondary | ICD-10-CM

## 2020-10-18 DIAGNOSIS — M6281 Muscle weakness (generalized): Secondary | ICD-10-CM

## 2020-10-18 DIAGNOSIS — G8929 Other chronic pain: Secondary | ICD-10-CM

## 2020-10-18 DIAGNOSIS — M25561 Pain in right knee: Secondary | ICD-10-CM | POA: Diagnosis not present

## 2020-10-18 DIAGNOSIS — M25661 Stiffness of right knee, not elsewhere classified: Secondary | ICD-10-CM

## 2020-10-18 NOTE — Therapy (Signed)
[facility] [address] [place], [address] Phone: [phone]   Fax:  [phone]  Physical Therapy Treatment  Patient Details  Name: Kimberly Herring MRN: [id number] Date of Birth: July 25, 1966 Referring Provider (PT): Naiping M. Erlinda Hong, MD   Encounter Date: 10/18/2020   PT End of Session - 10/18/20 1402    Visit Number 6    Number of Visits 24    Date for PT Re-Evaluation 12/07/20    Authorization Type [employer] reporting period from 09/14/2020    Authorization Time Period 06/16/20 through 08/25/20    Authorization - Visit Number --    Authorization - Number of Visits --    Progress Note Due on Visit 10    PT Start Time 1316    PT Stop Time 1350    PT Time Calculation (min) 34 min    Activity Tolerance Patient tolerated treatment well;Patient limited by pain    Behavior During Therapy [facility] for tasks assessed/performed           Past Medical History:  Diagnosis Date  . Anemia   . Anxiety   . Arthritis   . Asthma   . Bipolar 2 disorder ([place])   . Complication of anesthesia    hard to put to sleep  . Depression   . Family history of adverse reaction to anesthesia    Mother is hard to wake up  . Fibromyalgia   . Hypertension   . Hypoglycemia following gastrointestinal surgery    post-bariatric surgery hypoglycemia (followed at [facility])  . Lung nodules    lung nodules ~1999; diagnosed wtih "Legionnaires", reported follow-up imaging showed nodules had resolved but some "scar tissue" present  . Migraine aura, persistent   . 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 (Hematologist Dr. Sterling Big, MD at [facility])  . Pneumonia   . Undifferentiated connective tissue disease [facility])    [facility]    Past Surgical History:  Procedure Laterality Date  . ABDOMINAL HYSTERECTOMY    . CESAREAN SECTION    . ESOPHAGOGASTRODUODENOSCOPY    . FOOT  SURGERY Bilateral   . GASTRIC BYPASS    . HERNIA REPAIR    . HIP SURGERY    . KNEE SURGERY Bilateral   . LAPAROSCOPIC OOPHERECTOMY    . SHOULDER SURGERY Bilateral   . TONSILLECTOMY    . TOTAL KNEE ARTHROPLASTY Right 08/22/2020   Procedure: RIGHT TOTAL KNEE ARTHROPLASTY;  Surgeon: Leandrew Koyanagi, MD;  Location: [place];  Service: Orthopedics;  Laterality: Right;  . TUBAL LIGATION      There were no vitals filed for this visit.   Subjective Assessment - 10/18/20 1320    Subjective Patient reports her right knee is swollen and painful today after trying to use a cane almost all day yesterday instead of her RW. She had 6/10 pain there and took some pain medication. She is still having the pain at the right posteriolateral region. States her doctor thought it was a baker's cyst and would "loosen up" with time. States it has bothered her for a long time but this pain was not the reason for her TKA.    Pertinent History Patient is a 55 y.o. female who presents to outpatient physical therapy with a referral for medical diagnosis s/p R TKA. 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   Relevant past medical history and comorbidities include platelet storage pool disease (doesn't clot quickly enough, carries a medication to take in case she needs it), fibromyalgia, asthma (has rescue inhaler), lung nodules (being followed), connective tissue disease, bipolar 2 with hallucinations, depression (sees mental health therapist weekly), arthritis, anemia, previous pelvic floor PT, hernia repair, hip surgery, bilateral knee surgery, bilateral shoulder surgery, hypertension, leg edema, fatigue. .  Patient  denies hx of cancer, stroke, seizures, lung problem (except asthma and lung nodule being watched), major cardiac events (except clotting disorder), diabetes, unexplained weight loss, changes in bowel or bladder problems, new onset stumbling or dropping things apart from described below.    Limitations Lifting;Standing;Walking;House hold activities    Patient Stated Goals to get better    Currently in Pain? Yes    Pain Score 6            TREATMENT:   Therapeutic exercise: to centralize symptoms and improve ROM, strength, muscular endurance, and activity tolerance required for successful completion of functional activities.   -AA/ROM using recumbent bike 5 minutes seat #12, then 2 minutes seat #9 (painful at posterolateral R knee) -Leg press3x10 R knee 25/35/35# L knee 25# (attempted 35# for 3 reps and too painful) one leg at a time. (one hole showing on seat side).  - close chain AAROM R knee flexion on 2nd step on stair, 1x10 with 3 second hold. BUE support on railings.  -Rt SLS c LLE toe taps (BUE supported on rails as needed) to improve single limb stability.  tapping 2nd step 2x15  -Lateral sidestep with black TB at knees, 2x20 each side alternating sides, BUE support  -standing resisted knee flexion with 5# AW 2x10 bilat  -standing bilat heel raise 1x20 in parallel, BUE support -Long arc quads with7.5# AW3x10 each side  Pt required multimodal cuing for proper technique and to facilitate improved neuromuscular control, strength, range of motion, and functional ability resulting in improved performance and form.   HOME EXERCISE PROGRAM Access Code: [email or web address] URL: [email or web address] Date: 09/14/2020 Prepared by: [name]  Exercises Supine Active Straight Leg Raise - 1 x daily - 3 sets - 10 reps Sit to Stand with Armchair - 1 x daily - 3 sets - 5-10 reps     PT Education - 10/18/20 2012    Education Details Exercise purpose/form. Self management  techniques.    Person(s) Educated Patient    Methods Explanation;Demonstration;Verbal cues;Tactile cues    Comprehension Verbalized understanding;Returned demonstration;Verbal cues required;Tactile cues required;Need further instruction            PT Short Term Goals - 09/14/20 1245      PT SHORT TERM GOAL #1   Title Be independent with initial home exercise program for self-management of symptoms.    Baseline initial HEP provided at IE (09/14/2020);    Time 3    Period Weeks    Status New    Target Date 10/05/20             PT Long Term Goals - 09/20/20 1817      PT LONG TERM GOAL #1   Title Be independent with a long-term home exercise program for self-management of symptoms.    Baseline initial HEP provided at IE (09/14/2020);  Time 12    Period Weeks    Status New   TARGET DATE FOR ALL LONG TERM GOALS: 12/07/2020     PT LONG TERM GOAL #2   Title Patient will complete 5 Time Sit To Stand Test in equal or less than 12 seconds from 18.5 inch plinth with no UE support to improve ability to move around at home and at work while carrying items.    Baseline 13 seconds from 18.5 inch plinth to RW with B UE support on plinth (09/14/2020);    Time 12    Period Weeks    Status New      PT LONG TERM GOAL #3   Title Demonstrate improved FOTO score to equal or greater than 41 by visit #15 to demonstrate improvement in overall condition and self-reported functional ability.    Baseline 32 (09/14/2020);    Time 12    Period Weeks    Status New      PT LONG TERM GOAL #4   Title Patient will demonstrate R knee PROM 0 - 120 to improve her ability to navigate over obstacles in her community without difficulty.    Baseline 0-103 (09/14/2020);    Time 12    Period Weeks    Status New      PT LONG TERM GOAL #5   Title Patient will ambulate equal or greater than 1000 feet using LRAD on the 6 Minute Walk Test to demonstrate improved community mobility.    Baseline to be tested  session 2 as appropriate (09/14/2020); 09/14/2020 780ft    Time 12    Period Weeks    Status New      PT LONG TERM GOAL #6   Title Complete community, work and/or recreational activities without limitation due to current condition.    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 Patient is motivated to improve and reports she will put ice on it at home. She had some difficulty getting here on time related to being out of her long acting migraine medication that lead to an intense migraine yesterday and disrupted her travel to the clinic today. Plans to be back tomorrow for her second session this week. Continues to focus on LE strengthening and ROM as tolerated. Patient would benefit from continued management of limiting condition by skilled physical therapist to address remaining impairments and functional limitations to work towards stated goals and return to PLOF or maximal functional independence.    Personal Factors and Comorbidities Age;Comorbidity 3+;Past/Current Experience;Fitness;Time since onset of injury/illness/exacerbation;Transportation    Comorbidities Anxiety, Arthritis, Bipolar disorder, Depression, Fibromyalgia, Migraines,Had an abdominal hysterectomy, c-section, B hammer-toe repair, gastric bypass (needs revision), hernia repair, hip-surgery (labral repair), B knee surgery to regenerate meniscus, B oophorectomy, B shoulder surgeries for rotator cuff repair in 2012 and 2016, tonsillectomy, and  tubal ligation, obesity.    Examination-Activity Limitations  Lift;Stairs;Continence;Bend;Locomotion Level;Bathing;Bed Mobility;Squat;Stand;Caring for Others;Carry;Self Feeding;Dressing;Sleep;Transfers    Examination-Participation Restrictions Yard Work;Cleaning;Laundry;Community Activity;Driving;Occupation;Interpersonal Relationship;Meal Prep;Shop    Stability/Clinical  Decision Making Evolving/Moderate complexity    Rehab Potential Good    PT Frequency 2x / week    PT Duration 12 weeks    PT Treatment/Interventions ADLs/Self Care Home Management;Moist Heat;Electrical Stimulation;Therapeutic activities;Functional mobility training;Stair training;Gait training;Therapeutic exercise;Balance training;Neuromuscular re-education;Patient/family education;Manual techniques;Dry needling;Taping;Spinal Manipulations;Joint Manipulations;Cryotherapy;Passive range of motion;Scar mobilization;Energy conservation;Manual lymph drainage    PT Next Visit Plan progress HEP as appropriate    PT Home Exercise Plan Medbridge Access Code: [email or web address]    Consulted and Agree with Plan of Care Patient           Patient will benefit from skilled therapeutic intervention in order to improve the following deficits and impairments:  Abnormal gait,Decreased endurance,Difficulty walking,Obesity,Improper body mechanics,Pain,Postural dysfunction,Decreased strength,Decreased coordination,Decreased activity tolerance,Decreased skin integrity,Decreased scar mobility,Decreased mobility,Prosthetic Dependency,Impaired perceived functional ability,Hypomobility,Decreased range of motion,Decreased balance,Increased edema,Impaired flexibility  Visit Diagnosis: Chronic pain of right knee  Stiffness of right knee, not elsewhere classified  Muscle weakness (generalized)  Difficulty in walking, not elsewhere classified  History of falling     Problem List Patient Active Problem List   Diagnosis Date Noted  . Status post total knee replacement, right 08/22/2020  . Primary osteoarthritis of  right knee 08/21/2020  . Leg edema 06/22/2019  . Essential hypertension 06/22/2019  . Fatigue 06/22/2019    Everlean Alstrom. Graylon Good, PT, DPT 10/18/20, 8:13 PM  [facility] [address], [place], [address] Phone: [phone]   Fax:  [phone]  Name: Kimberly Herring MRN: [id number] Date of Birth: August 03, 1966

## 2020-10-19 ENCOUNTER — Ambulatory Visit: Payer: BC Managed Care – PPO | Admitting: Physical Therapy

## 2020-10-19 ENCOUNTER — Encounter: Payer: Self-pay | Admitting: Physical Therapy

## 2020-10-19 DIAGNOSIS — R262 Difficulty in walking, not elsewhere classified: Secondary | ICD-10-CM

## 2020-10-19 DIAGNOSIS — M6281 Muscle weakness (generalized): Secondary | ICD-10-CM

## 2020-10-19 DIAGNOSIS — Z9181 History of falling: Secondary | ICD-10-CM

## 2020-10-19 DIAGNOSIS — G8929 Other chronic pain: Secondary | ICD-10-CM

## 2020-10-19 DIAGNOSIS — M25561 Pain in right knee: Secondary | ICD-10-CM | POA: Diagnosis not present

## 2020-10-19 DIAGNOSIS — M25661 Stiffness of right knee, not elsewhere classified: Secondary | ICD-10-CM

## 2020-10-19 NOTE — Therapy (Signed)
[facility] [address], [place], [address] Phone: [phone]   Fax:  [phone]  Physical Therapy Treatment  Patient Details  Name: Kimberly Herring MRN: [id number] Date of Birth: [birth year] Referring Provider (PT): Naiping M. Erlinda Hong, MD   Encounter Date: 10/19/2020   PT End of Session - 10/19/20 0913    Visit Number 7    Number of Visits 24    Date for PT Re-Evaluation 12/07/20    Authorization Type [employer] reporting period from 09/14/2020    Authorization Time Period 06/16/20 through 08/25/20    Progress Note Due on Visit 10    PT Start Time 0905    PT Stop Time 0945    PT Time Calculation (min) 40 min    Activity Tolerance Patient tolerated treatment well;Patient limited by pain    Behavior During Therapy [facility] for tasks assessed/performed           Past Medical History:  Diagnosis Date  . Anemia   . Anxiety   . Arthritis   . Asthma   . Bipolar 2 disorder ([place])   . Complication of anesthesia    hard to put to sleep  . Depression   . Family history of adverse reaction to anesthesia    Mother is hard to wake up  . Fibromyalgia   . Hypertension   . Hypoglycemia following gastrointestinal surgery    post-bariatric surgery hypoglycemia (followed at [facility])  . Lung nodules    lung nodules ~1999; diagnosed wtih "Legionnaires", reported follow-up imaging showed nodules had resolved but some "scar tissue" present  . Migraine aura, persistent   . 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 (Hematologist Dr. Sterling Big, MD at [facility])  . Pneumonia   . Undifferentiated connective tissue disease [facility])    [facility]    Past Surgical History:  Procedure Laterality Date  . ABDOMINAL HYSTERECTOMY    . CESAREAN SECTION    . ESOPHAGOGASTRODUODENOSCOPY    . FOOT SURGERY Bilateral   . GASTRIC BYPASS    . HERNIA REPAIR    . HIP SURGERY    .  KNEE SURGERY Bilateral   . LAPAROSCOPIC OOPHERECTOMY    . SHOULDER SURGERY Bilateral   . TONSILLECTOMY    . TOTAL KNEE ARTHROPLASTY Right 08/22/2020   Procedure: RIGHT TOTAL KNEE ARTHROPLASTY;  Surgeon: Leandrew Koyanagi, MD;  Location: [place];  Service: Orthopedics;  Laterality: Right;  . TUBAL LIGATION      There were no vitals filed for this visit.   Subjective Assessment - 10/19/20 0910    Subjective Patient was last here yesterday. She said she took two norco and had a lot of pain after last session but was able to sleep a lot. She states she has 4/10 pain at the anterior R knee this morning and just got up so she is still feeling her morning stiffness/soreness. She is motivated to continue and felt she could handle the pain she had.    Pertinent History Patient is a 55 y.o. female who presents to outpatient physical therapy with a referral for medical diagnosis s/p R TKA. 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   Relevant past medical history and comorbidities include platelet storage pool disease (doesn't clot quickly enough, carries a medication to take in case she needs it), fibromyalgia, asthma (has rescue inhaler), lung nodules (being followed), connective tissue disease, bipolar 2 with hallucinations, depression (sees mental health therapist weekly), arthritis, anemia, previous pelvic floor PT, hernia repair, hip surgery, bilateral knee surgery, bilateral shoulder surgery, hypertension, leg edema, fatigue. .  Patient denies hx of cancer, stroke, seizures, lung problem (except asthma and lung nodule being watched), major cardiac events (except clotting disorder), diabetes,  unexplained weight loss, changes in bowel or bladder problems, new onset stumbling or dropping things apart from described below.    Limitations Lifting;Standing;Walking;House hold activities    Patient Stated Goals to get better    Currently in Pain? Yes    Pain Score 4            TREATMENT:   Therapeutic exercise:to centralize symptoms and improve ROM, strength, muscular endurance, and activity tolerance required for successful completion of functional activities.  -AA/ROM using recumbent bike 5 minutes seat #14, then 2 minutes seat #11(painful at posterolateral R knee) -Leg press3x10 R knee 25/35/35# L knee 25# (attempted 35# for 3 reps and too painful) one leg at a time. (one hole showing on seat side).  - SLS c LE toe taps (BUE supported on rails as needed) to improve single limb stability.  tapping 2nd step 2x15 each side.  -Lateral sidestep with black TB at knees, 2x20 each side alternating sides, BUE support  -standing resisted knee flexion with 5# AW 2x10 bilat  -standing bilat heel raise 2x20 in parallel, BUE support -Long arc quads with10# AW3x10 each side - ambulation with SPC in L UE x 100+50 feet with CGA - SBA progressing from step to to step through gait pattern.   Pt required multimodal cuing for proper technique and to facilitate improved neuromuscular control, strength, range of motion, and functional ability resulting in improved performance and form.   HOME EXERCISE PROGRAM Access Code: [email or web address] URL: [email or web address] Date: 09/14/2020 Prepared by: [name]  Exercises Supine Active Straight Leg Raise - 1 x daily - 3 sets - 10 reps Sit to Stand with Armchair - 1 x daily - 3 sets - 5-10 reps     PT Education - 10/19/20 0912    Education Details Exercise purpose/form. Self management techniques.    Person(s) Educated Patient    Methods Explanation;Demonstration;Tactile cues;Verbal cues    Comprehension Verbalized  understanding;Returned demonstration;Verbal cues required;Tactile cues required;Need further instruction            PT Short Term Goals - 09/14/20 1245      PT SHORT TERM GOAL #1   Title Be independent with initial home exercise program for self-management of symptoms.    Baseline initial HEP provided at IE (09/14/2020);    Time 3    Period Weeks    Status New    Target Date 10/05/20             PT Long Term Goals - 09/20/20 1817      PT LONG TERM GOAL #1   Title Be independent with a long-term home exercise program for self-management of symptoms.    Baseline initial HEP provided at IE (09/14/2020);    Time 12    Period Weeks    Status New   TARGET DATE FOR ALL LONG TERM GOALS: 12/07/2020  PT LONG TERM GOAL #2   Title Patient will complete 5 Time Sit To Stand Test in equal or less than 12 seconds from 18.5 inch plinth with no UE support to improve ability to move around at home and at work while carrying items.    Baseline 13 seconds from 18.5 inch plinth to RW with B UE support on plinth (09/14/2020);    Time 12    Period Weeks    Status New      PT LONG TERM GOAL #3   Title Demonstrate improved FOTO score to equal or greater than 41 by visit #15 to demonstrate improvement in overall condition and self-reported functional ability.    Baseline 32 (09/14/2020);    Time 12    Period Weeks    Status New      PT LONG TERM GOAL #4   Title Patient will demonstrate R knee PROM 0 - 120 to improve her ability to navigate over obstacles in her community without difficulty.    Baseline 0-103 (09/14/2020);    Time 12    Period Weeks    Status New      PT LONG TERM GOAL #5   Title Patient will ambulate equal or greater than 1000 feet using LRAD on the 6 Minute Walk Test to demonstrate improved community mobility.    Baseline to be tested session 2 as appropriate (09/14/2020); 09/14/2020 717ft    Time 12    Period Weeks    Status New      PT LONG TERM GOAL #6   Title  Complete community, work and/or recreational activities without limitation due to current condition.    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 Patient sore from PT yesterday but was able continue mild progression in resistance and worked more on practicing moving to a less restrictive AD today. Patient is also limited by pain in the left knee that she is planning to have surgery on in the future.  Patient would benefit from continued management of limiting condition by skilled physical therapist to address remaining impairments and functional limitations to work towards stated goals and return to PLOF or maximal functional independence.    Personal Factors and Comorbidities Age;Comorbidity 3+;Past/Current Experience;Fitness;Time since onset of injury/illness/exacerbation;Transportation    Comorbidities Anxiety, Arthritis, Bipolar disorder, Depression, Fibromyalgia, Migraines,Had an abdominal hysterectomy, c-section, B hammer-toe repair, gastric bypass (needs revision), hernia repair, hip-surgery (labral repair), B knee surgery to regenerate meniscus, B oophorectomy, B shoulder surgeries for rotator cuff repair in 2012 and 2016, tonsillectomy, and  tubal ligation, obesity.    Examination-Activity Limitations Lift;Stairs;Continence;Bend;Locomotion Level;Bathing;Bed Mobility;Squat;Stand;Caring for Others;Carry;Self Feeding;Dressing;Sleep;Transfers    Examination-Participation Restrictions Yard Work;Cleaning;Laundry;Community Activity;Driving;Occupation;Interpersonal Relationship;Meal Prep;Shop    Stability/Clinical  Decision Making Evolving/Moderate complexity    Rehab Potential Good    PT Frequency 2x / week    PT Duration 12 weeks    PT Treatment/Interventions ADLs/Self Care Home Management;Moist Heat;Electrical Stimulation;Therapeutic activities;Functional mobility training;Stair training;Gait training;Therapeutic exercise;Balance training;Neuromuscular re-education;Patient/family education;Manual techniques;Dry needling;Taping;Spinal Manipulations;Joint Manipulations;Cryotherapy;Passive range of motion;Scar  mobilization;Energy conservation;Manual lymph drainage    PT Next Visit Plan progress HEP as appropriate    PT Home Exercise Plan Medbridge Access Code: [email or web address]    Consulted and Agree with Plan of Care Patient           Patient will benefit from skilled therapeutic intervention in order to improve the following deficits and impairments:  Abnormal gait,Decreased endurance,Difficulty walking,Obesity,Improper body mechanics,Pain,Postural dysfunction,Decreased strength,Decreased coordination,Decreased activity tolerance,Decreased skin integrity,Decreased scar mobility,Decreased mobility,Prosthetic Dependency,Impaired perceived functional ability,Hypomobility,Decreased range of motion,Decreased balance,Increased edema,Impaired flexibility  Visit Diagnosis: Chronic pain of right knee  Stiffness of right knee, not elsewhere classified  Muscle weakness (generalized)  Difficulty in walking, not elsewhere classified  History of falling     Problem List Patient Active Problem List   Diagnosis Date Noted  . Status post total knee replacement, right 08/22/2020  . Primary osteoarthritis of right knee 08/21/2020  . Leg edema 06/22/2019  . Essential hypertension 06/22/2019  . Fatigue 06/22/2019    Everlean Alstrom. Graylon Good, PT, DPT 10/19/20, 9:37 AM  [facility] [address] [place], [address] Phone: [phone]   Fax:   [phone]  Name: Kimberly Herring MRN: [id number] Date of Birth: 1966/03/20

## 2020-10-20 ENCOUNTER — Encounter: Payer: Self-pay | Admitting: Orthopaedic Surgery

## 2020-10-22 ENCOUNTER — Encounter: Payer: Self-pay | Admitting: Orthopaedic Surgery

## 2020-10-24 ENCOUNTER — Other Ambulatory Visit: Payer: Self-pay

## 2020-10-24 ENCOUNTER — Ambulatory Visit: Payer: BC Managed Care – PPO | Admitting: Physical Therapy

## 2020-10-24 DIAGNOSIS — G8929 Other chronic pain: Secondary | ICD-10-CM

## 2020-10-24 DIAGNOSIS — M25661 Stiffness of right knee, not elsewhere classified: Secondary | ICD-10-CM

## 2020-10-24 DIAGNOSIS — R262 Difficulty in walking, not elsewhere classified: Secondary | ICD-10-CM

## 2020-10-24 DIAGNOSIS — M6281 Muscle weakness (generalized): Secondary | ICD-10-CM

## 2020-10-24 DIAGNOSIS — M25561 Pain in right knee: Secondary | ICD-10-CM | POA: Diagnosis not present

## 2020-10-24 DIAGNOSIS — Z9181 History of falling: Secondary | ICD-10-CM

## 2020-10-24 NOTE — Therapy (Signed)
[facility] [address] [place], [address] Phone: [phone]   Fax:  [phone]  Physical Therapy Treatment  Patient Details  Name: Kimberly Herring MRN: [id number] Date of Birth: [birth year] Referring Provider (PT): Naiping M. Erlinda Hong, MD   Encounter Date: 10/24/2020   PT End of Session - 10/24/20 1716    Visit Number 8    Number of Visits 24    Date for PT Re-Evaluation 12/07/20    Authorization Type [employer] reporting period from 09/14/2020    Authorization Time Period --    Progress Note Due on Visit 10    PT Start Time 1520    PT Stop Time 1600    PT Time Calculation (min) 40 min    Activity Tolerance Patient tolerated treatment well;Patient limited by pain    Behavior During Therapy [facility] for tasks assessed/performed           Past Medical History:  Diagnosis Date  . Anemia   . Anxiety   . Arthritis   . Asthma   . Bipolar 2 disorder ([place] Herring)   . Complication of anesthesia    hard to put to sleep  . Depression   . Family history of adverse reaction to anesthesia    Mother is hard to wake up  . Fibromyalgia   . Hypertension   . Hypoglycemia following gastrointestinal surgery    post-bariatric surgery hypoglycemia (followed at [facility])  . Lung nodules    lung nodules ~1999; diagnosed wtih "Legionnaires", reported follow-up imaging showed nodules had resolved but some "scar tissue" present  . Migraine aura, persistent   . 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 (Hematologist [facility]. Sterling Big, MD at [facility])  . Pneumonia   . Undifferentiated connective tissue disease [facility])    [facility]    Past Surgical History:  Procedure Laterality Date  . ABDOMINAL HYSTERECTOMY    . CESAREAN SECTION    . ESOPHAGOGASTRODUODENOSCOPY    . FOOT SURGERY Bilateral   . GASTRIC BYPASS    . HERNIA REPAIR    . HIP SURGERY    . KNEE SURGERY  Bilateral   . LAPAROSCOPIC OOPHERECTOMY    . SHOULDER SURGERY Bilateral   . TONSILLECTOMY    . TOTAL KNEE ARTHROPLASTY Right 08/22/2020   Procedure: RIGHT TOTAL KNEE ARTHROPLASTY;  Surgeon: Leandrew Koyanagi, MD;  Location: [place];  Service: Orthopedics;  Laterality: Right;  . TUBAL LIGATION      There were no vitals filed for this visit.   Subjective Assessment - 10/24/20 1523    Subjective Patient reports her right knee pain is not bad today, 2/10 a C shape around the medial side of the patella. L knee is bothering her more 7/10 everywhere. She has been using her came She reports she felt okay after her last treatment session. She has been increasing her use of the [facility] but she can ony increase by an hour every 3 days insead of every day. States cleaning is the hardest thing for her to do at home right now. Especially cleaining anything that is lower where she has to squat.    Pertinent History Patient is a 55 y.o. female who presents to outpatient physical therapy with a referral for medical diagnosis s/p R TKA. 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   Relevant past medical history and comorbidities include platelet storage pool disease (doesn't clot quickly enough, carries a medication to take in case she needs it), fibromyalgia, asthma (has rescue inhaler), lung nodules (being followed), connective tissue disease, bipolar 2 with hallucinations, depression (sees mental health therapist weekly), arthritis, anemia, previous pelvic floor PT, hernia repair, hip surgery, bilateral knee surgery, bilateral shoulder surgery, hypertension, leg edema, fatigue. .  Patient denies hx of cancer,  stroke, seizures, lung problem (except asthma and lung nodule being watched), major cardiac events (except clotting disorder), diabetes, unexplained weight loss, changes in bowel or bladder problems, new onset stumbling or dropping things apart from described below.    Limitations Lifting;Standing;Walking;House hold activities    Patient Stated Goals to get better    Currently in Pain? Yes    Pain Score 2            TREATMENT:  Therapeutic exercise:to centralize symptoms and improve ROM, strength, muscular endurance, and activity tolerance required for successful completion of functional activities. -AA/ROM using recumbent bike 2.5 minutes seat #13, then 2.5 minutes seat #11, 2 min seat #9 (limited by pain at posterolateral R knee) - standing R gastroc/soleus stretch 2x30 seconds.  -Leg press3x12R knee35/45/45#L knee 20# (attempted 25# for 3 reps and too painful)one leg at a time. (one hole showing on seat side).  - ambulation with SPC in L UE x 350 feet with CGA - SBA with much improved gait pattern, step through.  -Lateral sidestep withblackTB at knees, 2x20each sidealternating sides, BUE support  - Education on HEP including handout   Manual therapy: to reduce pain and tissue tension, improve range of motion, neuromodulation, in order to promote improved ability to complete functional activities. Seated position:  - joint mobilization to the right proximal tib/fib joint grade III to assess joint play and contribution to pain in the region.  - STM with and without "the stick" IASTM to R triceps surae, producing concordant pain with pressure at proximal lateral gastroc head. Patient report decreased pain   Pt required multimodal cuing for proper technique and to facilitate improved neuromuscular control, strength, range of motion, and functional ability resulting in improved performance and form.   HOME EXERCISE PROGRAM Access Code: [email or web address] URL:  [email or web address] Date: 10/24/2020 Prepared by: [name]  Exercises Sit to Stand with Armchair - 1 x daily - 3 sets - 5-10 reps Standing Gastroc Stretch at Counter - 1 x daily - 2 reps - 30 seconds hold Standing Soleus Stretch at Counter - 1 x daily - 2 reps - 30 seconds hold    PT Education - 10/24/20 1532    Education Details Exercise purpose/form. Self management techniques.    Person(s) Educated Patient    Methods Explanation;Verbal cues    Comprehension Verbalized understanding;Verbal cues required;Need further instruction            PT Short Term Goals - 09/14/20 1245      PT SHORT TERM GOAL #1   Title Be independent with initial home exercise program for self-management of symptoms.    Baseline initial HEP provided at IE (09/14/2020);    Time 3    Period Weeks    Status New    Target Date 10/05/20  PT Long Term Goals - 09/20/20 1817      PT LONG TERM GOAL #1   Title Be independent with a long-term home exercise program for self-management of symptoms.    Baseline initial HEP provided at IE (09/14/2020);    Time 12    Period Weeks    Status New   TARGET DATE FOR ALL LONG TERM GOALS: 12/07/2020     PT LONG TERM GOAL #2   Title Patient will complete 5 Time Sit To Stand Test in equal or less than 12 seconds from 18.5 inch plinth with no UE support to improve ability to move around at home and at work while carrying items.    Baseline 13 seconds from 18.5 inch plinth to RW with B UE support on plinth (09/14/2020);    Time 12    Period Weeks    Status New      PT LONG TERM GOAL #3   Title Demonstrate improved FOTO score to equal or greater than 41 by visit #15 to demonstrate improvement in overall condition and self-reported functional ability.    Baseline 32 (09/14/2020);    Time 12    Period Weeks    Status New      PT LONG TERM GOAL #4   Title Patient will demonstrate R knee PROM 0 - 120 to improve her ability to navigate  over obstacles in her community without difficulty.    Baseline 0-103 (09/14/2020);    Time 12    Period Weeks    Status New      PT LONG TERM GOAL #5   Title Patient will ambulate equal or greater than 1000 feet using LRAD on the 6 Minute Walk Test to demonstrate improved community mobility.    Baseline to be tested session 2 as appropriate (09/14/2020); 09/14/2020 731ft    Time 12    Period Weeks    Status New      PT LONG TERM GOAL #6   Title Complete community, work and/or recreational activities without limitation due to current condition.    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 Further assessment of the painful region revealed it is a painful place in the proximal lateral gastroc head that patient felt was better following manual and stretching. Patient demonstrates significant improvement in gait pattern and confidence with SPC ambulation. Function continues to be limited in left knee pain as well. Patient would benefit from continued management of limiting condition by skilled physical therapist to address remaining impairments and functional limitations to work towards stated goals and return to PLOF or maximal functional independence.    Personal Factors and Comorbidities Age;Comorbidity 3+;Past/Current Experience;Fitness;Time since onset of injury/illness/exacerbation;Transportation    Comorbidities Anxiety, Arthritis, Bipolar disorder, Depression, Fibromyalgia, Migraines,Had an abdominal hysterectomy, c-section, B hammer-toe repair, gastric bypass  (needs revision), hernia repair, hip-surgery (labral repair), B knee surgery to regenerate meniscus, B oophorectomy, B shoulder surgeries for rotator cuff repair in 2012 and 2016, tonsillectomy, and  tubal ligation, obesity.    Examination-Activity Limitations Lift;Stairs;Continence;Bend;Locomotion Level;Bathing;Bed Mobility;Squat;Stand;Caring for Others;Carry;Self Feeding;Dressing;Sleep;Transfers    Examination-Participation Restrictions Yard Work;Cleaning;Laundry;Community Activity;Driving;Occupation;Interpersonal Relationship;Meal Prep;Shop    Stability/Clinical Decision Making Evolving/Moderate complexity    Rehab Potential Good    PT Frequency 2x / week    PT Duration 12 weeks    PT Treatment/Interventions ADLs/Self Care Home Management;Moist Heat;Electrical Stimulation;Therapeutic activities;Functional mobility training;Stair training;Gait training;Therapeutic exercise;Balance training;Neuromuscular re-education;Patient/family education;Manual techniques;Dry needling;Taping;Spinal Manipulations;Joint Manipulations;Cryotherapy;Passive range of motion;Scar mobilization;Energy conservation;Manual lymph drainage    PT Next Visit Plan progressive functional LE strengthening and ROM    PT Home Exercise Plan Medbridge Access Code: [email or web address]    Consulted and Agree with Plan of Care Patient           Patient will benefit from skilled therapeutic intervention in order to improve the following deficits and impairments:  Abnormal gait,Decreased endurance,Difficulty walking,Obesity,Improper body mechanics,Pain,Postural dysfunction,Decreased strength,Decreased coordination,Decreased activity tolerance,Decreased skin integrity,Decreased scar mobility,Decreased mobility,Prosthetic Dependency,Impaired perceived functional ability,Hypomobility,Decreased range of motion,Decreased balance,Increased edema,Impaired flexibility  Visit Diagnosis: Chronic pain of right knee  Stiffness of right knee, not elsewhere  classified  Muscle weakness (generalized)  Difficulty in walking, not elsewhere classified  History of falling     Problem List Patient Active Problem List   Diagnosis Date Noted  . Status post total knee replacement, right 08/22/2020  . Primary osteoarthritis of right knee 08/21/2020  . Leg edema 06/22/2019  . Essential hypertension 06/22/2019  . Fatigue 06/22/2019    Everlean Alstrom. Graylon Good, PT, DPT 10/24/20, 5:22 PM  [facility] [address], [place], [address] Phone: [phone]   Fax:  [phone]  Name: Suhani Chelf MRN: [id number] Date of Birth: 1966/09/12

## 2020-10-24 NOTE — Telephone Encounter (Signed)
Yes that's fine to give her that note.  Thanks.

## 2020-10-26 ENCOUNTER — Other Ambulatory Visit: Payer: Self-pay

## 2020-10-26 ENCOUNTER — Encounter: Payer: Self-pay | Admitting: Physical Therapy

## 2020-10-26 ENCOUNTER — Ambulatory Visit: Payer: BC Managed Care – PPO | Attending: Obstetrics and Gynecology | Admitting: Physical Therapy

## 2020-10-26 DIAGNOSIS — R293 Abnormal posture: Secondary | ICD-10-CM | POA: Insufficient documentation

## 2020-10-26 DIAGNOSIS — G8929 Other chronic pain: Secondary | ICD-10-CM | POA: Diagnosis present

## 2020-10-26 DIAGNOSIS — R262 Difficulty in walking, not elsewhere classified: Secondary | ICD-10-CM | POA: Diagnosis present

## 2020-10-26 DIAGNOSIS — M25661 Stiffness of right knee, not elsewhere classified: Secondary | ICD-10-CM | POA: Insufficient documentation

## 2020-10-26 DIAGNOSIS — M62838 Other muscle spasm: Secondary | ICD-10-CM | POA: Diagnosis present

## 2020-10-26 DIAGNOSIS — M4125 Other idiopathic scoliosis, thoracolumbar region: Secondary | ICD-10-CM | POA: Diagnosis present

## 2020-10-26 DIAGNOSIS — Z9181 History of falling: Secondary | ICD-10-CM | POA: Insufficient documentation

## 2020-10-26 DIAGNOSIS — M6281 Muscle weakness (generalized): Secondary | ICD-10-CM | POA: Insufficient documentation

## 2020-10-26 DIAGNOSIS — R278 Other lack of coordination: Secondary | ICD-10-CM | POA: Diagnosis present

## 2020-10-26 DIAGNOSIS — M25561 Pain in right knee: Secondary | ICD-10-CM | POA: Diagnosis present

## 2020-10-26 NOTE — Therapy (Signed)
[facility] [address], [place], [address] Phone: [phone]   Fax:  [phone]  Physical Therapy Treatment  Patient Details  Name: Kimberly Herring MRN: [id number] Date of Birth: 17-Sep-1966 Referring Provider (PT): Naiping M. Erlinda Hong, MD   Encounter Date: 10/26/2020   PT End of Session - 10/26/20 2013    Visit Number 9    Number of Visits 24    Date for PT Re-Evaluation 12/07/20    Authorization Type [employer] reporting period from 09/14/2020    Progress Note Due on Visit 10    PT Start Time 1620    PT Stop Time 1700    PT Time Calculation (min) 40 min    Activity Tolerance Patient tolerated treatment well;Patient limited by pain    Behavior During Therapy [facility] for tasks assessed/performed           Past Medical History:  Diagnosis Date  . Anemia   . Anxiety   . Arthritis   . Asthma   . Bipolar 2 disorder ([place])   . Complication of anesthesia    hard to put to sleep  . Depression   . Family history of adverse reaction to anesthesia    Mother is hard to wake up  . Fibromyalgia   . Hypertension   . Hypoglycemia following gastrointestinal surgery    post-bariatric surgery hypoglycemia (followed at [facility])  . Lung nodules    lung nodules ~1999; diagnosed wtih "[employer]", reported follow-up imaging showed nodules had resolved but some "scar tissue" present  . Migraine aura, persistent   . 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 (Hematologist Dr. Sterling Big, MD at [facility])  . Pneumonia   . Undifferentiated connective tissue disease [facility])    [facility]    Past Surgical History:  Procedure Laterality Date  . ABDOMINAL HYSTERECTOMY    . CESAREAN SECTION    . ESOPHAGOGASTRODUODENOSCOPY    . FOOT SURGERY Bilateral   . GASTRIC BYPASS    . HERNIA REPAIR    . HIP SURGERY    . KNEE SURGERY Bilateral   . LAPAROSCOPIC OOPHERECTOMY     . SHOULDER SURGERY Bilateral   . TONSILLECTOMY    . TOTAL KNEE ARTHROPLASTY Right 08/22/2020   Procedure: RIGHT TOTAL KNEE ARTHROPLASTY;  Surgeon: Leandrew Koyanagi, MD;  Location: [facility];  Service: Orthopedics;  Laterality: Right;  . TUBAL LIGATION      There were no vitals filed for this visit.   Subjective Assessment - 10/26/20 1625    Subjective Patient reports she has 5/10 pain in the right anteriomedial knee upon arrival. States her bilateral hips hurt really bad in the groin area shortly after last treatment session. The pain went down both legs and it was so sore she could not do her exercises yesterday. Also had pain in the right posteriolateral knee but feels like the manual therapy there last session helped. Had to take two doses of pain medication. She has not heard back from Dr. Erlinda Hong about returning to work. Currently B groin is 6/10, left knee 6/10. Did not take any medication today prior to session. Reports she did a lot of errands following her last session. Is frustrated by continued pain and swelling with minimal activity.    Pertinent History Patient is a 55 y.o. female who presents to outpatient physical therapy with a referral for medical diagnosis s/p R  TKA. 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   Relevant past medical history and comorbidities include platelet storage pool disease (doesn't clot quickly enough, carries a medication to take in case she needs it), fibromyalgia, asthma (has rescue inhaler), lung nodules (being followed), connective tissue disease, bipolar 2 with hallucinations, depression (sees mental health therapist weekly), arthritis, anemia,  previous pelvic floor PT, hernia repair, hip surgery, bilateral knee surgery, bilateral shoulder surgery, hypertension, leg edema, fatigue. .  Patient denies hx of cancer, stroke, seizures, lung problem (except asthma and lung nodule being watched), major cardiac events (except clotting disorder), diabetes, unexplained weight loss, changes in bowel or bladder problems, new onset stumbling or dropping things apart from described below.    Limitations Lifting;Standing;Walking;House hold activities    Patient Stated Goals to get better    Currently in Pain? Yes    Pain Score 5            TREATMENT:  Therapeutic exercise:to centralize symptoms and improve ROM, strength, muscular endurance, and activity tolerance required for successful completion of functional activities. -AA/ROM using recumbent bike 5 minutes seat #13, then 2 minutes seat #12, (limited by pain at posterolateral R knee)  (manual therapy - see below)  - ambulation with SPC in L UE x 350 feet with SBA with much improved gait pattern, step through.  -Leg press3x12R knee35/45/45#L knee 25#, one leg at a time. (one hole showing on seat side).  -Lateral sidestep withblackTB at knees, 2x20each sidealternating sides, BUE support   Manual therapy: to reduce pain and tissue tension, improve range of motion, neuromodulation, in order to promote improved ability to complete functional activities. Reclined position:  - joint mobilization AP to the right tibiofemoral joint grade III, and right patellar mobs all directions.  - scar massage to R knee incision - STM with and without "the stick" IASTM to R triceps surae and quads.   Pt required multimodal cuing for proper technique and to facilitate improved neuromuscular control, strength, range of motion, and functional ability resulting in improved performance and form.   HOME EXERCISE PROGRAM Access Code: [email or web address] URL: [email or web address] Date:  10/24/2020 Prepared by: [name]  Exercises Sit to Stand with Armchair - 1 x daily - 3 sets - 5-10 reps Standing Gastroc Stretch at Counter - 1 x daily - 2 reps - 30 seconds hold Standing Soleus Stretch at Counter - 1 x daily - 2 reps - 30 seconds hold    PT Education - 10/26/20 2013    Education Details Exercise purpose/form. Self management techniques.    Person(s) Educated Patient    Methods Explanation;Demonstration;Verbal cues    Comprehension Verbalized understanding;Returned demonstration;Verbal cues required;Need further instruction            PT Short Term Goals - 09/14/20 1245      PT SHORT TERM GOAL #1   Title Be independent with initial home exercise program for self-management of symptoms.    Baseline initial HEP provided at IE (09/14/2020);    Time 3    Period Weeks    Status New    Target Date 10/05/20             PT Long Term Goals -  09/20/20 1817      PT LONG TERM GOAL #1   Title Be independent with a long-term home exercise program for self-management of symptoms.    Baseline initial HEP provided at [facility] (09/14/2020);    Time 12    Period Weeks    Status New   TARGET DATE FOR ALL LONG TERM GOALS: 12/07/2020     PT LONG TERM GOAL #2   Title Patient will complete 5 Time Sit To Stand Test in equal or less than 12 seconds from 18.5 inch plinth with no UE support to improve ability to move around at home and at work while carrying items.    Baseline 13 seconds from 18.5 inch plinth to RW with B UE support on plinth (09/14/2020);    Time 12    Period Weeks    Status New      PT LONG TERM GOAL #3   Title Demonstrate improved FOTO score to equal or greater than 41 by visit #15 to demonstrate improvement in overall condition and self-reported functional ability.    Baseline 32 (09/14/2020);    Time 12    Period Weeks    Status New      PT LONG TERM GOAL #4   Title Patient will demonstrate R knee PROM 0 - 120 to improve her ability to navigate over  obstacles in her community without difficulty.    Baseline 0-103 (09/14/2020);    Time 12    Period Weeks    Status New      PT LONG TERM GOAL #5   Title Patient will ambulate equal or greater than 1000 feet using LRAD on the 6 Minute Walk Test to demonstrate improved community mobility.    Baseline to be tested session 2 as appropriate (09/14/2020); 09/14/2020 720ft    Time 12    Period Weeks    Status New      PT LONG TERM GOAL #6   Title Complete community, work and/or recreational activities without limitation due to current condition.    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 More attention was given to pain control techniques including manual therapy, which patient reported decreased her pain. Patient requested more direction and to "push her" when PT gave her multiple options to help with communication for mitigation of pain. Patient continued with similar exercises to past sessions. Reported she felt okay at end of session, but that her difficulty with pain usually starts a couple hours later. Patient would benefit from continued management of limiting condition by skilled physical therapist to address remaining impairments and functional limitations to work towards stated goals and return to PLOF or maximal functional independence.    Personal Factors and Comorbidities Age;Comorbidity 3+;Past/Current Experience;Fitness;Time since onset of injury/illness/exacerbation;Transportation    Comorbidities Anxiety, Arthritis, Bipolar disorder, Depression, Fibromyalgia, Migraines,Had an  abdominal hysterectomy, c-section, B hammer-toe repair, gastric bypass (needs revision), hernia repair, hip-surgery (labral repair), B knee surgery to regenerate meniscus, B oophorectomy, B shoulder surgeries for rotator cuff  repair in 2012 and 2016, tonsillectomy, and  tubal ligation, obesity.    Examination-Activity Limitations Lift;Stairs;Continence;Bend;Locomotion Level;Bathing;Bed Mobility;Squat;Stand;Caring for Others;Carry;Self Feeding;Dressing;Sleep;Transfers    Examination-Participation Restrictions Yard Work;Cleaning;Laundry;Community Activity;Driving;Occupation;Interpersonal Relationship;Meal Prep;Shop    Stability/Clinical Decision Making Evolving/Moderate complexity    Rehab Potential Good    PT Frequency 2x / week    PT Duration 12 weeks    PT Treatment/Interventions ADLs/Self Care Home Management;Moist Heat;Electrical Stimulation;Therapeutic activities;Functional mobility training;Stair training;Gait training;Therapeutic exercise;Balance training;Neuromuscular re-education;Patient/family education;Manual techniques;Dry needling;Taping;Spinal Manipulations;Joint Manipulations;Cryotherapy;Passive range of motion;Scar mobilization;Energy conservation;Manual lymph drainage    PT Next Visit Plan progressive functional LE strengthening and ROM    PT Home Exercise Plan Medbridge Access Code: [email or web address]    Consulted and Agree with Plan of Care Patient           Patient will benefit from skilled therapeutic intervention in order to improve the following deficits and impairments:  Abnormal gait,Decreased endurance,Difficulty walking,Obesity,Improper body mechanics,Pain,Postural dysfunction,Decreased strength,Decreased coordination,Decreased activity tolerance,Decreased skin integrity,Decreased scar mobility,Decreased mobility,Prosthetic Dependency,Impaired perceived functional ability,Hypomobility,Decreased range of motion,Decreased balance,Increased edema,Impaired flexibility  Visit  Diagnosis: Chronic pain of right knee  Stiffness of right knee, not elsewhere classified  Muscle weakness (generalized)  Difficulty in walking, not elsewhere classified  History of falling     Problem List Patient Active Problem List   Diagnosis Date Noted  . Status post total knee replacement, right 08/22/2020  . Primary osteoarthritis of right knee 08/21/2020  . Leg edema 06/22/2019  . Essential hypertension 06/22/2019  . Fatigue 06/22/2019    Everlean Alstrom. Graylon Good, PT, DPT 10/26/20, 8:17 PM  [place] [facility] [address], [place], [address] Phone: [phone]   Fax:  [phone]  Name: Kimberly Herring MRN: [id number] Date of Birth: 1965/10/28

## 2020-10-27 ENCOUNTER — Ambulatory Visit: Payer: BC Managed Care – PPO | Admitting: Physical Therapy

## 2020-10-28 NOTE — Telephone Encounter (Signed)
Ok I have made the changes in the note.

## 2020-10-31 ENCOUNTER — Ambulatory Visit: Payer: BC Managed Care – PPO | Admitting: Physical Therapy

## 2020-11-01 ENCOUNTER — Ambulatory Visit: Payer: BC Managed Care – PPO | Admitting: Physical Therapy

## 2020-11-01 ENCOUNTER — Other Ambulatory Visit: Payer: Self-pay

## 2020-11-01 ENCOUNTER — Encounter: Payer: Self-pay | Admitting: Physical Therapy

## 2020-11-01 DIAGNOSIS — M25661 Stiffness of right knee, not elsewhere classified: Secondary | ICD-10-CM

## 2020-11-01 DIAGNOSIS — Z9181 History of falling: Secondary | ICD-10-CM

## 2020-11-01 DIAGNOSIS — M25561 Pain in right knee: Secondary | ICD-10-CM | POA: Diagnosis not present

## 2020-11-01 DIAGNOSIS — R262 Difficulty in walking, not elsewhere classified: Secondary | ICD-10-CM

## 2020-11-01 DIAGNOSIS — G8929 Other chronic pain: Secondary | ICD-10-CM

## 2020-11-01 DIAGNOSIS — M6281 Muscle weakness (generalized): Secondary | ICD-10-CM

## 2020-11-01 NOTE — Therapy (Signed)
[facility] [address] [place], [address] Phone: [phone]   Fax:  [phone]  Physical Therapy Treatment / Progress Note Reporting period: 09/14/2020 - 11/01/2020  Patient Details  Name: Kimberly Herring MRN: [id number] Date of Birth: 11/09/1965 Referring Provider (PT): Naiping M. Erlinda Hong, MD   Encounter Date: 11/01/2020   PT End of Session - 11/01/20 1837    Visit Number 10    Number of Visits 24    Date for PT Re-Evaluation 12/07/20    Authorization Type [employer] reporting period from 09/14/2020    Progress Note Due on Visit 10    PT Start Time 1829    PT Stop Time 1913    PT Time Calculation (min) 44 min    Activity Tolerance Patient tolerated treatment well;Patient limited by pain    Behavior During Therapy [facility] for tasks assessed/performed           Past Medical History:  Diagnosis Date  . Anemia   . Anxiety   . Arthritis   . Asthma   . Bipolar 2 disorder ([place])   . Complication of anesthesia    hard to put to sleep  . Depression   . Family history of adverse reaction to anesthesia    Mother is hard to wake up  . Fibromyalgia   . Hypertension   . Hypoglycemia following gastrointestinal surgery    post-bariatric surgery hypoglycemia (followed at [facility])  . Lung nodules    lung nodules ~1999; diagnosed wtih "[employer]", reported follow-up imaging showed nodules had resolved but some "scar tissue" present  . Migraine aura, persistent   . 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 (Hematologist Dr. Sterling Big, MD at [facility])  . Pneumonia   . Undifferentiated connective tissue disease [facility])    [facility]    Past Surgical History:  Procedure Laterality Date  . ABDOMINAL HYSTERECTOMY    . CESAREAN SECTION    . ESOPHAGOGASTRODUODENOSCOPY    . FOOT SURGERY Bilateral   . GASTRIC BYPASS    . HERNIA REPAIR    . HIP SURGERY     . KNEE SURGERY Bilateral   . LAPAROSCOPIC OOPHERECTOMY    . SHOULDER SURGERY Bilateral   . TONSILLECTOMY    . TOTAL KNEE ARTHROPLASTY Right 08/22/2020   Procedure: RIGHT TOTAL KNEE ARTHROPLASTY;  Surgeon: Leandrew Koyanagi, MD;  Location: [place];  Service: Orthopedics;  Laterality: Right;  . TUBAL LIGATION      There were no vitals filed for this visit.   Subjective Assessment - 11/01/20 1830    Subjective Patient reports her pain is 2/10 today and it is "really good." she started back to work yesterday for 4 hours a day that she can do any time and this is going well for her. She had a lot of pain on Sunday and missed church. Still having some pain at the right posteriolatral knee. States her knee continues to swell and hurt. She states she had a lot of pain following last treatment session.    Pertinent History Patient is a 55 y.o. female who presents to outpatient physical therapy with a referral for medical diagnosis s/p R TKA. 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   Relevant past medical history and comorbidities include platelet storage pool disease (doesn't clot quickly enough, carries a medication to take in case she needs it), fibromyalgia, asthma (has rescue inhaler), lung nodules (being followed), connective tissue disease, bipolar 2 with hallucinations, depression (sees mental health therapist weekly), arthritis, anemia, previous pelvic floor PT, hernia repair, hip surgery, bilateral knee surgery, bilateral shoulder surgery, hypertension, leg edema, fatigue. .  Patient denies hx of cancer, stroke, seizures, lung problem (except asthma and lung nodule being watched), major  cardiac events (except clotting disorder), diabetes, unexplained weight loss, changes in bowel or bladder problems, new onset stumbling or dropping things apart from described below.    Limitations Lifting;Standing;Walking;House hold activities    Patient Stated Goals to get better    Currently in Pain? Yes    Pain Score 2             OBJECTIVE  FOTO = 41 (11/01/2020)  PROM R KNEE Flexion: 107 painful at anterior knee and posteriolateral Extension:  0   FUNCTIONAL TESTS Five Time Sit to Stand (5TSTS): 9.44 seconds from 18.5 inch plinth with no UE use. Did offload L LE some.  6 Minute Walk Test: 760 feet with SPC in L UE and supervision.   TREATMENT:   TREATMENT:  Therapeutic exercise:to centralize symptoms and improve ROM, strength, muscular endurance, and activity tolerance required for successful completion of functional activities. -AA/ROM using recumbent bike25mnutes seat #14, then 212mutes seat #11/12, (limited bypainat posterolateral R knee) - ambulation for distance in [facility]  - sit <> stand from 18.5 plinth with no UE support x 1, x5 for speed. RW in front for confidence.  - measurements to assess progress (see above).   Manual therapy:to reduce pain and tissue tension, improve range of motion, neuromodulation, in order to promote improved ability to complete functional activities. Reclined position:  - joint mobilization AP and PA  to the right tibiofemoral joint grade III, and right patellar mobs all directions.  - scar massage to R knee incision - STM with and without "the stick" IASTM to R quads.   Pt required multimodal cuing for proper technique and to facilitate improved neuromuscular control, strength, range of motion, and functional ability resulting in improved performance and form.   HOME EXERCISE PROGRAM Access Code: [email or web address] URL: [email or web address] Date: 10/24/2020 Prepared by:  SaRosita KeaExercises Sit to Stand with Armchair - 1 x daily - 3 sets - 5-10 reps Standing Gastroc Stretch at Counter - 1 x daily - 2 reps - 30 seconds hold Standing Soleus Stretch at Counter - 1 x daily - 2 reps - 30 seconds hold    PT Education - 11/01/20 1836    Education Details Exercise purpose/form. Self management techniques. POC.    Person(s) Educated Patient    Methods Explanation;Demonstration;Verbal cues    Comprehension Verbalized understanding;Returned demonstration;Verbal cues required;Need further instruction            PT Short Term Goals - 09/14/20 1245      PT SHORT TERM GOAL #1   Title Be independent with initial home exercise program for self-management of symptoms.    Baseline initial HEP provided at IE (09/14/2020);    Time 3    Period Weeks    Status New    Target Date 10/05/20  PT Long Term Goals - 11/01/20 1847      PT LONG TERM GOAL #1   Title Be independent with a long-term home exercise program for self-management of symptoms.    Baseline initial HEP provided at IE (09/14/2020); currently participating in appropriate HEP (11/01/2020);    Time 12    Period Weeks    Status Partially Met   TARGET DATE FOR ALL LONG TERM GOALS: 12/07/2020   Target Date --      PT LONG TERM GOAL #2   Title Patient will complete 5 Time Sit To Stand Test in equal or less than 12 seconds from 18.5 inch plinth with no UE support to improve ability to move around at home and at work while carrying items.    Baseline 13 seconds from 18.5 inch plinth to RW with B UE support on plinth (09/14/2020);  9.44 seconds from 18.5 inch plinth with no UE use. Did offload L LE some (11/01/2020);    Time 12    Period Weeks    Status Achieved      PT LONG TERM GOAL #3   Title Demonstrate improved FOTO score to equal or greater than 41 by visit #15 to demonstrate improvement in overall condition and self-reported functional ability.    Baseline 32 (09/14/2020); 41 at  visit 10(11/01/20);    Time 12    Period Weeks    Status Achieved      PT LONG TERM GOAL #4   Title Patient will demonstrate R knee PROM 0 - 120 to improve her ability to navigate over obstacles in her community without difficulty.    Baseline 0-103 (09/14/2020); 0-107 (11/01/2020);    Time 12    Period Weeks    Status New      PT LONG TERM GOAL #5   Title Patient will ambulate equal or greater than 1000 feet using LRAD on the 6 Minute Walk Test to demonstrate improved community mobility.    Baseline to be tested session 2 as appropriate (09/14/2020); 09/14/2020 769f with RW (09/14/2020): 760 feet with SPC in L UE and supervision (11/01/2020);    Time 12    Period Weeks    Status Partially Met      PT LONG TERM GOAL #6   Title Complete community, work and/or recreational activities without limitation due to current condition.    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 She has met two long term goals and progressed towards remaining goals. She still lacks ROM which has been negatively affected by pinching sensation at posterolateral R knee and has not yet returned to PLOF. She continues to report activity limiting pain and swelling and has returned to work at half time with accommodations. Patient would benefit from continued management of limiting condition by skilled physical therapist  to address remaining  impairments and functional limitations to work towards stated goals and return to PLOF or maximal functional independence.    Personal Factors and Comorbidities Age;Comorbidity 3+;Past/Current Experience;Fitness;Time since onset of injury/illness/exacerbation;Transportation    Comorbidities Anxiety, Arthritis, Bipolar disorder, Depression, Fibromyalgia, Migraines,Had an abdominal hysterectomy, c-section, B hammer-toe repair, gastric bypass (needs revision), hernia repair, hip-surgery (labral repair), B knee surgery to regenerate meniscus, B oophorectomy, B shoulder surgeries for rotator cuff repair in 2012 and 2016, tonsillectomy, and  tubal ligation, obesity.    Examination-Activity Limitations Lift;Stairs;Continence;Bend;Locomotion Level;Bathing;Bed Mobility;Squat;Stand;Caring for Others;Carry;Self Feeding;Dressing;Sleep;Transfers    Examination-Participation Restrictions Yard Work;Cleaning;Laundry;Community Activity;Driving;Occupation;Interpersonal Relationship;Meal Prep;Shop    Stability/Clinical Decision Making Evolving/Moderate complexity    Rehab Potential Good    PT Frequency 2x / week    PT Duration 12 weeks    PT Treatment/Interventions ADLs/Self Care Home Management;Moist Heat;Electrical Stimulation;Therapeutic activities;Functional mobility training;Stair training;Gait training;Therapeutic exercise;Balance training;Neuromuscular re-education;Patient/family education;Manual techniques;Dry needling;Taping;Spinal Manipulations;Joint Manipulations;Cryotherapy;Passive range of motion;Scar mobilization;Energy conservation;Manual lymph drainage    PT Next Visit Plan progressive functional LE strengthening and ROM    PT Home Exercise Plan Medbridge Access Code: [email or web address]    Consulted and Agree with Plan of Care Patient           Patient will benefit from skilled therapeutic intervention in order to improve the following deficits and impairments:  Abnormal gait,Decreased  endurance,Difficulty walking,Obesity,Improper body mechanics,Pain,Postural dysfunction,Decreased strength,Decreased coordination,Decreased activity tolerance,Decreased skin integrity,Decreased scar mobility,Decreased mobility,Prosthetic Dependency,Impaired perceived functional ability,Hypomobility,Decreased range of motion,Decreased balance,Increased edema,Impaired flexibility  Visit Diagnosis: Chronic pain of right knee  Stiffness of right knee, not elsewhere classified  Muscle weakness (generalized)  Difficulty in walking, not elsewhere classified  History of falling     Problem List Patient Active Problem List   Diagnosis Date Noted  . Status post total knee replacement, right 08/22/2020  . Primary osteoarthritis of right knee 08/21/2020  . Leg edema 06/22/2019  . Essential hypertension 06/22/2019  . Fatigue 06/22/2019    Everlean Alstrom. Graylon Good, PT, DPT 11/01/20, 7:20 PM  [facility] [address] [place], [address] Phone: [phone]   Fax:  [phone]  Name: Ellamarie Naeve MRN: [id number] Date of Birth: September 16, 1966

## 2020-11-02 ENCOUNTER — Encounter: Payer: BC Managed Care – PPO | Admitting: Physical Therapy

## 2020-11-03 ENCOUNTER — Encounter: Payer: Self-pay | Admitting: Physical Therapy

## 2020-11-03 ENCOUNTER — Ambulatory Visit: Payer: BC Managed Care – PPO | Admitting: Physical Therapy

## 2020-11-03 ENCOUNTER — Telehealth: Payer: Self-pay | Admitting: Radiology

## 2020-11-03 ENCOUNTER — Other Ambulatory Visit: Payer: Self-pay

## 2020-11-03 ENCOUNTER — Telehealth: Payer: Self-pay | Admitting: Physical Therapy

## 2020-11-03 DIAGNOSIS — M25561 Pain in right knee: Secondary | ICD-10-CM | POA: Diagnosis not present

## 2020-11-03 DIAGNOSIS — Z9181 History of falling: Secondary | ICD-10-CM

## 2020-11-03 DIAGNOSIS — M25661 Stiffness of right knee, not elsewhere classified: Secondary | ICD-10-CM

## 2020-11-03 DIAGNOSIS — R262 Difficulty in walking, not elsewhere classified: Secondary | ICD-10-CM

## 2020-11-03 DIAGNOSIS — G8929 Other chronic pain: Secondary | ICD-10-CM

## 2020-11-03 DIAGNOSIS — M6281 Muscle weakness (generalized): Secondary | ICD-10-CM

## 2020-11-03 NOTE — Telephone Encounter (Signed)
Called referring physician's office to report concern about pain at the posteriolateral R knee into the proximal lateral gastroc that has not improved over the last several weeks and is blocking patient from making progress in ROM. Staff took message for him since he was in surgery today.   Everlean Alstrom. Graylon Good, PT, DPT 11/03/20, 3:36 PM

## 2020-11-03 NOTE — Therapy (Signed)
[facility] [address], [place], [address] Phone: [phone]   Fax:  [phone]  Physical Therapy Treatment  Patient Details  Name: Kimberly Herring MRN: [id number] Date of Birth: 09/04/1966 Referring Provider (PT): Naiping M. Erlinda Hong, MD   Encounter Date: 11/03/2020   PT End of Session - 11/03/20 1447    Visit Number 11    Number of Visits 24    Date for PT Re-Evaluation 12/07/20    Authorization Type [employer] reporting period from 11/01/2020    Progress Note Due on Visit 10    PT Start Time 1442    PT Stop Time 1522    PT Time Calculation (min) 40 min    Activity Tolerance Patient tolerated treatment well;Patient limited by pain    Behavior During Therapy [facility] for tasks assessed/performed           Past Medical History:  Diagnosis Date  . Anemia   . Anxiety   . Arthritis   . Asthma   . Bipolar 2 disorder ([place])   . Complication of anesthesia    hard to put to sleep  . Depression   . Family history of adverse reaction to anesthesia    Mother is hard to wake up  . Fibromyalgia   . Hypertension   . Hypoglycemia following gastrointestinal surgery    post-bariatric surgery hypoglycemia (followed at [facility])  . Lung nodules    lung nodules ~1999; diagnosed wtih "[employer]", reported follow-up imaging showed nodules had resolved but some "scar tissue" present  . Migraine aura, persistent   . 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 (Hematologist Dr. Sterling Big, MD at [facility]: [facility])  . Pneumonia   . Undifferentiated connective tissue disease [facility])    [facility]    Past Surgical History:  Procedure Laterality Date  . ABDOMINAL HYSTERECTOMY    . CESAREAN SECTION    . ESOPHAGOGASTRODUODENOSCOPY    . FOOT SURGERY Bilateral   . GASTRIC BYPASS    . HERNIA REPAIR    . HIP SURGERY    . KNEE SURGERY Bilateral   . LAPAROSCOPIC OOPHERECTOMY     . SHOULDER SURGERY Bilateral   . TONSILLECTOMY    . TOTAL KNEE ARTHROPLASTY Right 08/22/2020   Procedure: RIGHT TOTAL KNEE ARTHROPLASTY;  Surgeon: Leandrew Koyanagi, MD;  Location: [place];  Service: Orthopedics;  Laterality: Right;  . TUBAL LIGATION      There were no vitals filed for this visit.   Subjective Assessment - 11/03/20 1445    Subjective Patient reports she has 7/10 in her right anterior knee and proximal lateral calf after some tylenol and hydrocodone. She thinks she overdid walking on her cane yesterday and she was in "agony' last night due to high level of pain. Reports she felt okay following last treatment session.    Pertinent History Patient is a 55 y.o. female who presents to outpatient physical therapy with a referral for medical diagnosis s/p R TKA. 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   Relevant past medical history and comorbidities include platelet storage pool disease (doesn't clot quickly  enough, carries a medication to take in case she needs it), fibromyalgia, asthma (has rescue inhaler), lung nodules (being followed), connective tissue disease, bipolar 2 with hallucinations, depression (sees mental health therapist weekly), arthritis, anemia, previous pelvic floor PT, hernia repair, hip surgery, bilateral knee surgery, bilateral shoulder surgery, hypertension, leg edema, fatigue. .  Patient denies hx of cancer, stroke, seizures, lung problem (except asthma and lung nodule being watched), major cardiac events (except clotting disorder), diabetes, unexplained weight loss, changes in bowel or bladder problems, new onset stumbling or dropping things apart from  described below.    Limitations Lifting;Standing;Walking;House hold activities    Patient Stated Goals to get better    Currently in Pain? Yes    Pain Score 7             OBJECTIVE  PROM R KNEE Flexion: 105 mild painful stretch at anterior knee and intense sharp pain to 10/10 posteriolateral knee along lateral proximal gastroc, sharp/shooting at end range. Restricts further stretch into ROM, empty end feel.   TREATMENT:  Therapeutic exercise:to centralize symptoms and improve ROM, strength, muscular endurance, and activity tolerance required for successful completion of functional activities. -AA/ROM using recumbent bike56mnutes seat #14, then 272mutes seat #13, 2 min at seat #12, 2 min at seat #11 (limited bypainat posterolateral R knee)   Manual therapy:to reduce pain and tissue tension, improve range of motion, neuromodulation, in order to promote improved ability to complete functional activities.  Seated position:  - R Tibiofemoral distraction with AP, PA, and rotational mobilization bilateral directions, grade III-IV as tolerated.  - STM to R tricep surae focusing on lateral gatroc region. Slight difference in quality of tissue palpable in region of pain.   Supine position:  - joint mobilizationAP and PA to the righttibiofemoraljoint grade III, and right patellar mobs all directions.  - scar massage to R knee incision - STM with and without "the stick" IASTM to R and triceps surae.  - PROM R knee flexion with PA mobilization with PT forearm supporting behind knee.  - PROM R knee flexion to tolerance, 2x10, 5 seconds. Limited by posterolateral pain along calf/knee and jumps off table with end range motion, empty end feel. Unable to move past 105 degrees flexion due to severe pain at posteriolateral knee.   Pt required multimodal cuing for proper technique and to facilitate improved neuromuscular control, strength, range of motion, and functional ability  resulting in improved performance and form.  HOME EXERCISE PROGRAM Access Code: [email or web address] URL: [email or web address] Date: 10/24/2020 Prepared by: SaRosita KeaExercises Sit to Stand with Armchair - 1 x daily - 3 sets - 5-10 reps Standing Gastroc Stretch at Counter - 1 x daily - 2 reps - 30 seconds hold Standing Soleus Stretch at Counter - 1 x daily - 2 reps - 30 seconds hold     PT Education - 11/03/20 1447    Education Details Exercise purpose/form. Self management techniques.    Person(s) Educated Patient    Methods Explanation;Demonstration;Tactile cues;Verbal cues    Comprehension Verbalized understanding;Returned demonstration;Verbal cues required;Tactile cues required;Need further instruction            PT Short Term Goals - 09/14/20 1245      PT SHORT TERM GOAL #1   Title Be independent with initial home exercise program for self-management of symptoms.    Baseline initial HEP provided at IE (09/14/2020);    Time 3    Period Weeks    Status New    Target  Date 10/05/20             PT Long Term Goals - 11/01/20 1847      PT LONG TERM GOAL #1   Title Be independent with a long-term home exercise program for self-management of symptoms.    Baseline initial HEP provided at IE (09/14/2020); currently participating in appropriate HEP (11/01/2020);    Time 12    Period Weeks    Status Partially Met   TARGET DATE FOR ALL LONG TERM GOALS: 12/07/2020   Target Date --      PT LONG TERM GOAL #2   Title Patient will complete 5 Time Sit To Stand Test in equal or less than 12 seconds from 18.5 inch plinth with no UE support to improve ability to move around at home and at work while carrying items.    Baseline 13 seconds from 18.5 inch plinth to RW with B UE support on plinth (09/14/2020);  9.44 seconds from 18.5 inch plinth with no UE use. Did offload L LE some (11/01/2020);    Time 12    Period Weeks    Status Achieved      PT LONG TERM GOAL #3   Title  Demonstrate improved FOTO score to equal or greater than 41 by visit #15 to demonstrate improvement in overall condition and self-reported functional ability.    Baseline 32 (09/14/2020); 41 at visit 10(11/01/20);    Time 12    Period Weeks    Status Achieved      PT LONG TERM GOAL #4   Title Patient will demonstrate R knee PROM 0 - 120 to improve her ability to navigate over obstacles in her community without difficulty.    Baseline 0-103 (09/14/2020); 0-107 (11/01/2020);    Time 12    Period Weeks    Status New      PT LONG TERM GOAL #5   Title Patient will ambulate equal or greater than 1000 feet using LRAD on the 6 Minute Walk Test to demonstrate improved community mobility.    Baseline to be tested session 2 as appropriate (09/14/2020); 09/14/2020 80f with RW (09/14/2020): 760 feet with SPC in L UE and supervision (11/01/2020);    Time 12    Period Weeks    Status Partially Met      PT LONG TERM GOAL #6   Title Complete community, work and/or recreational activities without limitation due to current condition.    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 Patient's knee continues to be reactive with pain and swelling but patient appears to be managing it okay and responding appropriately. Has returned to 4 hours/day at work. Focused on improved flexion ROM today but  patient was severely limited by sharp and intense pain at the posteriolateral knee joint and proximal lateral gastroc region with  end range flexion. This started suddenly several weeks ago when patient twisted her knee when standing from sitting per her report and has not improved. Patient may need further medical evaluation as this is the greatest limitation in her progress. Called referring office to report concerns to physician and left a message with staff. Patient would benefit from continued management of limiting condition by skilled physical therapist to address remaining impairments and functional limitations to work towards stated goals and return to PLOF or maximal functional independence.    Personal Factors and Comorbidities Age;Comorbidity 3+;Past/Current Experience;Fitness;Time since onset of injury/illness/exacerbation;Transportation    Comorbidities Anxiety, Arthritis, Bipolar disorder, Depression, Fibromyalgia, Migraines,Had an abdominal hysterectomy, c-section, B hammer-toe repair, gastric bypass (needs revision), hernia repair, hip-surgery (labral repair), B knee surgery to regenerate meniscus, B oophorectomy, B shoulder surgeries for rotator cuff repair in 2012 and 2016, tonsillectomy, and  tubal ligation, obesity.    Examination-Activity Limitations Lift;Stairs;Continence;Bend;Locomotion Level;Bathing;Bed Mobility;Squat;Stand;Caring for Others;Carry;Self Feeding;Dressing;Sleep;Transfers    Examination-Participation Restrictions Yard Work;Cleaning;Laundry;Community Activity;Driving;Occupation;Interpersonal Relationship;Meal Prep;Shop    Stability/Clinical Decision Making Evolving/Moderate complexity    Rehab Potential Good    PT Frequency 2x / week    PT Duration 12 weeks    PT Treatment/Interventions ADLs/Self Care Home Management;Moist Heat;Electrical Stimulation;Therapeutic activities;Functional mobility training;Stair training;Gait training;Therapeutic exercise;Balance  training;Neuromuscular re-education;Patient/family education;Manual techniques;Dry needling;Taping;Spinal Manipulations;Joint Manipulations;Cryotherapy;Passive range of motion;Scar mobilization;Energy conservation;Manual lymph drainage    PT Next Visit Plan progressive functional LE strengthening and ROM    PT Home Exercise Plan Medbridge Access Code: [email or web address]    Consulted and Agree with Plan of Care Patient           Patient will benefit from skilled therapeutic intervention in order to improve the following deficits and impairments:  Abnormal gait,Decreased endurance,Difficulty walking,Obesity,Improper body mechanics,Pain,Postural dysfunction,Decreased strength,Decreased coordination,Decreased activity tolerance,Decreased skin integrity,Decreased scar mobility,Decreased mobility,Prosthetic Dependency,Impaired perceived functional ability,Hypomobility,Decreased range of motion,Decreased balance,Increased edema,Impaired flexibility  Visit Diagnosis: Chronic pain of right knee  Stiffness of right knee, not elsewhere classified  Muscle weakness (generalized)  Difficulty in walking, not elsewhere classified  History of falling     Problem List Patient Active Problem List   Diagnosis Date Noted  . Status post total knee replacement, right 08/22/2020  . Primary osteoarthritis of right knee 08/21/2020  . Leg edema 06/22/2019  . Essential hypertension 06/22/2019  . Fatigue 06/22/2019    Everlean Alstrom. Graylon Good, PT, DPT 11/03/20, 3:48 PM  [facility] [address] [place], [address] Phone: [phone]   Fax:  [phone]  Name: Kimberly Herring MRN: [id number] Date of Birth: Feb 15, 1966

## 2020-11-03 NOTE — Telephone Encounter (Signed)
Received call from Dorcas Carrow, PT at [facility] is complaining of pain in her proximal/lateral calf, from gastroc to knee. This is limiting them in regards to her flexion and range of motion.  Judson Roch was unsure if Dr. Erlinda Hong would like to see patient back in the office sooner than regularly scheduled appointment.  Patient has already left PT.  CB for Judson Roch [phone]  Patient's phone:  [phone]

## 2020-11-04 ENCOUNTER — Other Ambulatory Visit: Payer: Self-pay

## 2020-11-04 DIAGNOSIS — Z96651 Presence of right artificial knee joint: Secondary | ICD-10-CM

## 2020-11-04 NOTE — Telephone Encounter (Signed)
Called back no answer. See message below

## 2020-11-04 NOTE — Telephone Encounter (Signed)
No I feel that this has been an ongoing complaint of hers.  Thanks for letting me know

## 2020-11-04 NOTE — Telephone Encounter (Signed)
Please order doppler to r/o DVT

## 2020-11-07 ENCOUNTER — Encounter: Payer: Self-pay | Admitting: Physical Therapy

## 2020-11-07 ENCOUNTER — Other Ambulatory Visit: Payer: Self-pay

## 2020-11-07 ENCOUNTER — Ambulatory Visit: Payer: BC Managed Care – PPO | Admitting: Physical Therapy

## 2020-11-07 DIAGNOSIS — M25561 Pain in right knee: Secondary | ICD-10-CM | POA: Diagnosis not present

## 2020-11-07 DIAGNOSIS — M25661 Stiffness of right knee, not elsewhere classified: Secondary | ICD-10-CM

## 2020-11-07 DIAGNOSIS — Z9181 History of falling: Secondary | ICD-10-CM

## 2020-11-07 DIAGNOSIS — G8929 Other chronic pain: Secondary | ICD-10-CM

## 2020-11-07 DIAGNOSIS — R262 Difficulty in walking, not elsewhere classified: Secondary | ICD-10-CM

## 2020-11-07 DIAGNOSIS — M6281 Muscle weakness (generalized): Secondary | ICD-10-CM

## 2020-11-07 NOTE — Therapy (Signed)
[facility] [address], [place], [address] Phone: [phone]   Fax:  [phone]  Physical Therapy Treatment  Patient Details  Name: Kimberly Herring MRN: [id number] Date of Birth: 12/19/1965 Referring Provider (PT): Naiping M. Erlinda Hong, MD   Encounter Date: 11/07/2020   PT End of Session - 11/07/20 1430    Visit Number 12    Number of Visits 24    Date for PT Re-Evaluation 12/07/20    Authorization Type [employer] reporting period from 11/01/2020    Progress Note Due on Visit 10    PT Start Time 1436    PT Stop Time 1510    PT Time Calculation (min) 34 min    Activity Tolerance Patient tolerated treatment well;Patient limited by pain    Behavior During Therapy [facility] for tasks assessed/performed           Past Medical History:  Diagnosis Date  . Anemia   . Anxiety   . Arthritis   . Asthma   . Bipolar 2 disorder ([place])   . Complication of anesthesia    hard to put to sleep  . Depression   . Family history of adverse reaction to anesthesia    Mother is hard to wake up  . Fibromyalgia   . Hypertension   . Hypoglycemia following gastrointestinal surgery    post-bariatric surgery hypoglycemia (followed at [facility])  . Lung nodules    lung nodules ~1999; diagnosed wtih "[employer]", reported follow-up imaging showed nodules had resolved but some "scar tissue" present  . Migraine aura, persistent   . 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 (Hematologist Dr. Sterling Big, MD at [facility])  . Pneumonia   . Undifferentiated connective tissue disease [facility])    [facility]    Past Surgical History:  Procedure Laterality Date  . ABDOMINAL HYSTERECTOMY    . CESAREAN SECTION    . ESOPHAGOGASTRODUODENOSCOPY    . FOOT SURGERY Bilateral   . GASTRIC BYPASS    . HERNIA REPAIR    . HIP SURGERY    . KNEE SURGERY Bilateral   . LAPAROSCOPIC OOPHERECTOMY     . SHOULDER SURGERY Bilateral   . TONSILLECTOMY    . TOTAL KNEE ARTHROPLASTY Right 08/22/2020   Procedure: RIGHT TOTAL KNEE ARTHROPLASTY;  Surgeon: Leandrew Koyanagi, MD;  Location: [facility];  Service: Orthopedics;  Laterality: Right;  . TUBAL LIGATION      There were no vitals filed for this visit.   Subjective Assessment - 11/07/20 1435    Subjective Patient arrived at 2:28pm but needed extra time to change until 2:36pm before starting her session. Patient states her pain was terrible following her last treatment session, but that was a really painful day in general. States Dr. Phoebe Sharps office has not contacted her. Reports her pain is 4/10 generally at the R knee and 2/10 at the proximal lateral R gastroc.    Pertinent History Patient is a 55 y.o. female who presents to outpatient physical therapy with a referral for medical diagnosis s/p R TKA. 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   Relevant  past medical history and comorbidities include platelet storage pool disease (doesn't clot quickly enough, carries a medication to take in case she needs it), fibromyalgia, asthma (has rescue inhaler), lung nodules (being followed), connective tissue disease, bipolar 2 with hallucinations, depression (sees mental health therapist weekly), arthritis, anemia, previous pelvic floor PT, hernia repair, hip surgery, bilateral knee surgery, bilateral shoulder surgery, hypertension, leg edema, fatigue. .  Patient denies hx of cancer, stroke, seizures, lung problem (except asthma and lung nodule being watched), major cardiac events (except clotting disorder), diabetes, unexplained weight loss, changes in bowel  or bladder problems, new onset stumbling or dropping things apart from described below.    Limitations Lifting;Standing;Walking;House hold activities    Patient Stated Goals to get better    Currently in Pain? Yes    Pain Score 4           TREATMENT:  Therapeutic exercise:to centralize symptoms and improve ROM, strength, muscular endurance, and activity tolerance required for successful completion of functional activities. - ambulation for distance in 6 minute in clinic with [facility] and supervision: 700 feet  - R step up to 6 inch step with BUE support, 2x10 forwards, 1x10 sideways (more painful and challenging). Attempted with L LE, but too painful.  - seated LAQ 3x10 each side: R 07/08/14# AW, R 07/03/09# AW.  - standing side stepping with black theraband around knees, 3x15 feet each side with SPC and CGA for safety.  - seated R AAROM flexion/extension rolling red soccer ball with foot, 2x20.   Pt required multimodal cuing for proper technique and to facilitate improved neuromuscular control, strength, range of motion, and functional ability resulting in improved performance and form.   HOME EXERCISE PROGRAM Access Code: [email or web address] URL: [email or web address] Date: 10/24/2020 Prepared by: [name]  Exercises Sit to Stand with Armchair - 1 x daily - 3 sets - 5-10 reps Standing Gastroc Stretch at Counter - 1 x daily - 2 reps - 30 seconds hold Standing Soleus Stretch at Counter - 1 x daily - 2 reps - 30 seconds hold    PT Education - 11/07/20 1433    Education Details Exercise purpose/form. Self management techniques.    Person(s) Educated Patient    Methods Explanation;Demonstration;Tactile cues;Verbal cues    Comprehension Verbalized understanding;Returned demonstration;Verbal cues required;Tactile cues required;Need further instruction            PT Short Term Goals - 09/14/20 1245      PT SHORT TERM GOAL #1   Title Be independent with initial home  exercise program for self-management of symptoms.    Baseline initial HEP provided at IE (09/14/2020);    Time 3    Period Weeks    Status New    Target Date 10/05/20             PT Long Term Goals - 11/01/20 1847      PT LONG TERM GOAL #1   Title Be independent with a long-term home exercise program for self-management of symptoms.    Baseline initial HEP provided at IE (09/14/2020); currently participating in appropriate HEP (11/01/2020);    Time 12    Period Weeks    Status Partially Met   TARGET DATE FOR ALL LONG TERM GOALS: 12/07/2020   Target Date --      PT LONG TERM GOAL #2   Title Patient will complete 5 Time Sit To Stand Test in equal or less than 12 seconds from 18.5 inch plinth with no UE support to  improve ability to move around at home and at work while carrying items.    Baseline 13 seconds from 18.5 inch plinth to RW with B UE support on plinth (09/14/2020);  9.44 seconds from 18.5 inch plinth with no UE use. Did offload L LE some (11/01/2020);    Time 12    Period Weeks    Status Achieved      PT LONG TERM GOAL #3   Title Demonstrate improved FOTO score to equal or greater than 41 by visit #15 to demonstrate improvement in overall condition and self-reported functional ability.    Baseline 32 (09/14/2020); 41 at visit 10(11/01/20);    Time 12    Period Weeks    Status Achieved      PT LONG TERM GOAL #4   Title Patient will demonstrate R knee PROM 0 - 120 to improve her ability to navigate over obstacles in her community without difficulty.    Baseline 0-103 (09/14/2020); 0-107 (11/01/2020);    Time 12    Period Weeks    Status New      PT LONG TERM GOAL #5   Title Patient will ambulate equal or greater than 1000 feet using LRAD on the 6 Minute Walk Test to demonstrate improved community mobility.    Baseline to be tested session 2 as appropriate (09/14/2020); 09/14/2020 759f with RW (09/14/2020): 760 feet with SPC in L UE and supervision (11/01/2020);    Time 12     Period Weeks    Status Partially Met      PT LONG TERM GOAL #6   Title Complete community, work and/or recreational activities without limitation due to current condition.    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 Patient also declined to complete heel raises due to history of making her pain worse for hours. Patient would benefit from continued management of limiting condition by skilled physical therapist to address remaining impairments and functional limitations to work towards stated goals and return to PLOF or maximal functional independence.    Personal Factors and Comorbidities Age;Comorbidity 3+;Past/Current Experience;Fitness;Time since onset of injury/illness/exacerbation;Transportation    Comorbidities Anxiety, Arthritis, Bipolar disorder, Depression, Fibromyalgia, Migraines,Had an abdominal hysterectomy, c-section, B hammer-toe repair, gastric bypass (needs revision), hernia repair, hip-surgery (labral repair), B knee surgery to regenerate meniscus, B oophorectomy, B shoulder surgeries for rotator cuff repair in 2012 and 2016, tonsillectomy, and  tubal ligation, obesity.    Examination-Activity Limitations Lift;Stairs;Continence;Bend;Locomotion Level;Bathing;Bed Mobility;Squat;Stand;Caring for Others;Carry;Self  Feeding;Dressing;Sleep;Transfers    Examination-Participation Restrictions Yard Work;Cleaning;Laundry;Community Activity;Driving;Occupation;Interpersonal Relationship;Meal Prep;Shop    Stability/Clinical Decision Making Evolving/Moderate complexity    Rehab Potential Good    PT Frequency 2x / week    PT Duration 12 weeks    PT Treatment/Interventions ADLs/Self Care Home Management;Moist Heat;Electrical Stimulation;Therapeutic  activities;Functional mobility training;Stair training;Gait training;Therapeutic exercise;Balance training;Neuromuscular re-education;Patient/family education;Manual techniques;Dry needling;Taping;Spinal Manipulations;Joint Manipulations;Cryotherapy;Passive range of motion;Scar mobilization;Energy conservation;Manual lymph drainage    PT Next Visit Plan progressive functional LE strengthening and ROM    PT Home Exercise Plan Medbridge Access Code: [email or web address]    Consulted and Agree with Plan of Care Patient           Patient will benefit from skilled therapeutic intervention in order to improve the following deficits and impairments:  Abnormal gait,Decreased endurance,Difficulty walking,Obesity,Improper body mechanics,Pain,Postural dysfunction,Decreased strength,Decreased coordination,Decreased activity tolerance,Decreased skin integrity,Decreased scar mobility,Decreased mobility,Prosthetic Dependency,Impaired perceived functional ability,Hypomobility,Decreased range of motion,Decreased balance,Increased edema,Impaired flexibility  Visit Diagnosis: Chronic pain of right knee  Stiffness of right knee, not elsewhere classified  Muscle weakness (generalized)  Difficulty in walking, not elsewhere classified  History of falling     Problem List Patient Active Problem List   Diagnosis Date Noted  . Status post total knee replacement, right 08/22/2020  . Primary osteoarthritis of right knee 08/21/2020  . Leg edema 06/22/2019  . Essential hypertension 06/22/2019  .  Fatigue 06/22/2019    Everlean Alstrom. Graylon Good, PT, DPT 11/07/20, 3:32 PM  [facility] [address], [place], [address] Phone: [phone]   Fax:  [phone]  Name: Kimberly Herring MRN: [id number] Date of Birth: 12/09/1965

## 2020-11-07 NOTE — Telephone Encounter (Signed)
I'm so confused as to what exactly she needs.  Can you clarify with her what she wants exactly so that she is satisfied.  Thanks.

## 2020-11-09 ENCOUNTER — Encounter: Payer: BC Managed Care – PPO | Admitting: Physical Therapy

## 2020-11-09 ENCOUNTER — Telehealth: Payer: Self-pay

## 2020-11-09 NOTE — Telephone Encounter (Signed)
Called patient no answer LMOM. Need clarification on what needs to be on note.

## 2020-11-10 ENCOUNTER — Ambulatory Visit: Payer: BC Managed Care – PPO

## 2020-11-10 ENCOUNTER — Other Ambulatory Visit: Payer: Self-pay

## 2020-11-10 DIAGNOSIS — R262 Difficulty in walking, not elsewhere classified: Secondary | ICD-10-CM

## 2020-11-10 DIAGNOSIS — G8929 Other chronic pain: Secondary | ICD-10-CM

## 2020-11-10 DIAGNOSIS — M4125 Other idiopathic scoliosis, thoracolumbar region: Secondary | ICD-10-CM

## 2020-11-10 DIAGNOSIS — M25561 Pain in right knee: Secondary | ICD-10-CM | POA: Diagnosis not present

## 2020-11-10 DIAGNOSIS — Z9181 History of falling: Secondary | ICD-10-CM

## 2020-11-10 DIAGNOSIS — M25661 Stiffness of right knee, not elsewhere classified: Secondary | ICD-10-CM

## 2020-11-10 DIAGNOSIS — M6281 Muscle weakness (generalized): Secondary | ICD-10-CM

## 2020-11-10 DIAGNOSIS — R293 Abnormal posture: Secondary | ICD-10-CM

## 2020-11-10 DIAGNOSIS — R278 Other lack of coordination: Secondary | ICD-10-CM

## 2020-11-10 DIAGNOSIS — M62838 Other muscle spasm: Secondary | ICD-10-CM

## 2020-11-10 NOTE — Therapy (Signed)
[place] [facility] [address] [place], [address] Phone: [phone]   Fax:  [phone]  Physical Therapy Treatment  Patient Details  Name: Kimberly Herring MRN: [id number] Date of Birth: 07-Jun-1966 Referring Provider (PT): Naiping M. Erlinda Hong, MD   Encounter Date: 11/10/2020   PT End of Session - 11/10/20 1436    Visit Number 13    Number of Visits 24    Date for PT Re-Evaluation 12/07/20    Authorization Time Period 06/16/20 through 08/25/20    PT Start Time 1347    PT Stop Time 1427    PT Time Calculation (min) 40 min    Activity Tolerance Patient tolerated treatment well;Patient limited by pain    Behavior During Therapy [facility] for tasks assessed/performed           Past Medical History:  Diagnosis Date  . Anemia   . Anxiety   . Arthritis   . Asthma   . Bipolar 2 disorder ([place])   . Complication of anesthesia    hard to put to sleep  . Depression   . Family history of adverse reaction to anesthesia    Mother is hard to wake up  . Fibromyalgia   . Hypertension   . Hypoglycemia following gastrointestinal surgery    post-bariatric surgery hypoglycemia (followed at [facility])  . Lung nodules    lung nodules ~1999; diagnosed wtih "Legionnaires", reported follow-up imaging showed nodules had resolved but some "scar tissue" present  . Migraine aura, persistent   . 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 (Hematologist Dr. Sterling Big, MD at [facility])  . Pneumonia   . Undifferentiated connective tissue disease [facility])    [facility]    Past Surgical History:  Procedure Laterality Date  . ABDOMINAL HYSTERECTOMY    . CESAREAN SECTION    . ESOPHAGOGASTRODUODENOSCOPY    . FOOT SURGERY Bilateral   . GASTRIC BYPASS    . HERNIA REPAIR    . HIP SURGERY    . KNEE SURGERY Bilateral   . LAPAROSCOPIC OOPHERECTOMY    . SHOULDER SURGERY Bilateral   . TONSILLECTOMY    .  TOTAL KNEE ARTHROPLASTY Right 08/22/2020   Procedure: RIGHT TOTAL KNEE ARTHROPLASTY;  Surgeon: Leandrew Koyanagi, MD;  Location: [place];  Service: Orthopedics;  Laterality: Right;  . TUBAL LIGATION      There were no vitals filed for this visit.   Subjective Assessment - 11/10/20 1349    Subjective Patient reported she still has her calf pain, that it is still there, and is experiencing the pain at rest today as well.    Pertinent History Patient is a 55 y.o. female who presents to outpatient physical therapy with a referral for medical diagnosis s/p R TKA. 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   Relevant past medical history and comorbidities include platelet storage pool disease (doesn't clot quickly enough, carries a medication to take in case she needs it), fibromyalgia, asthma (has rescue inhaler), lung nodules (being followed), connective tissue disease, bipolar 2 with hallucinations, depression (sees mental health therapist weekly), arthritis, anemia, previous pelvic floor PT,  hernia repair, hip surgery, bilateral knee surgery, bilateral shoulder surgery, hypertension, leg edema, fatigue. .  Patient denies hx of cancer, stroke, seizures, lung problem (except asthma and lung nodule being watched), major cardiac events (except clotting disorder), diabetes, unexplained weight loss, changes in bowel or bladder problems, new onset stumbling or dropping things apart from described below.    Limitations Lifting;Standing;Walking;House hold activities    Patient Stated Goals to get better    Currently in Pain? Yes    Pain Score 3     Pain Location --   surgical leg   Pain Orientation  Right;Lateral    Pain Descriptors / Indicators Dull;Cramping;Penetrating;Pressure;Squeezing    Pain Type Chronic pain;Acute pain           TREATMENT:       Therapeutic exercise: to centralize symptoms and improve ROM, strength, muscular endurance, and activity tolerance required for successful completion of functional activities.    - ambulation for distance in 6 minute in clinic with [facility] and supervision: 700 feet    - R step up to 6 inch step with BUE support, 2x10 forwards; second set of 10 with 1 UE support.   - seated LAQ 3x10 each side: R 15# AW, R 10# AW.    - standing side stepping with black theraband around knees, 3x15 feet each side with supervision for safety.    - seated R AAROM flexion/extension rolling red soccer ball with foot, 2x20.     step over hurdle 2x10 second set with bilateral LEs stepping over hurdle. BUE support  Feet together on foam 2x30sec holds Feet one step forward on foam (R foot forward) 2x30seconds, supervision provided.    Pt required multimodal cuing for proper technique and to facilitate improved neuromuscular control, strength, range of motion, and functional ability resulting in improved performance and form.      Pt response/clinical impression: The patient demonstrated great motivation to participate with therapy. Often limited during session due to L knee pain or R gastroc pain, exercises modified accordingly. The patient would benefit from continued therapy to progress towards goals and improve QOL.   HOME EXERCISE PROGRAM   Access Code: [email or web address]   URL: [email or web address]   Date: 10/24/2020   Prepared by: [name]       Exercises   Sit to Stand with Armchair - 1 x daily - 3 sets - 5-10 reps   Standing Gastroc Stretch at Counter - 1 x daily - 2 reps - 30 seconds hold   Standing Soleus Stretch at Counter - 1 x daily - 2 reps - 30 seconds hold       PT Education - 11/10/20 1434    Education Details  exercise purpose/form    Person(s) Educated Patient    Methods Explanation;Demonstration;Tactile cues;Verbal cues    Comprehension Verbalized understanding;Returned demonstration;Verbal cues required            PT Short Term Goals - 09/14/20 1245      PT SHORT TERM GOAL #1   Title Be independent with initial home exercise program for self-management of symptoms.    Baseline initial HEP provided at IE (09/14/2020);    Time 3    Period Weeks    Status New    Target Date 10/05/20             PT Long Term Goals - 11/01/20 1847      PT LONG TERM GOAL #1   Title Be independent with a long-term home exercise program  for self-management of symptoms.    Baseline initial HEP provided at IE (09/14/2020); currently participating in appropriate HEP (11/01/2020);    Time 12    Period Weeks    Status Partially Met   TARGET DATE FOR ALL LONG TERM GOALS: 12/07/2020   Target Date --      PT LONG TERM GOAL #2   Title Patient will complete 5 Time Sit To Stand Test in equal or less than 12 seconds from 18.5 inch plinth with no UE support to improve ability to move around at home and at work while carrying items.    Baseline 13 seconds from 18.5 inch plinth to RW with B UE support on plinth (09/14/2020);  9.44 seconds from 18.5 inch plinth with no UE use. Did offload L LE some (11/01/2020);    Time 12    Period Weeks    Status Achieved      PT LONG TERM GOAL #3   Title Demonstrate improved FOTO score to equal or greater than 41 by visit #15 to demonstrate improvement in overall condition and self-reported functional ability.    Baseline 32 (09/14/2020); 41 at visit 10(11/01/20);    Time 12    Period Weeks    Status Achieved      PT LONG TERM GOAL #4   Title Patient will demonstrate R knee PROM 0 - 120 to improve her ability to navigate over obstacles in her community without difficulty.    Baseline 0-103 (09/14/2020); 0-107 (11/01/2020);    Time 12    Period Weeks    Status New      PT LONG  TERM GOAL #5   Title Patient will ambulate equal or greater than 1000 feet using LRAD on the 6 Minute Walk Test to demonstrate improved community mobility.    Baseline to be tested session 2 as appropriate (09/14/2020); 09/14/2020 738f with RW (09/14/2020): 760 feet with SPC in L UE and supervision (11/01/2020);    Time 12    Period Weeks    Status Partially Met      PT LONG TERM GOAL #6   Title Complete community, work and/or recreational activities without limitation due to current condition.    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 Often limited during session due to L knee pain or R gastroc pain, exercises modified accordingly. The patient would benefit from continued therapy to progress towards goals and improve QOL.    Personal Factors and Comorbidities Age;Comorbidity 3+;Past/Current Experience;Fitness;Time since onset of injury/illness/exacerbation;Transportation    Comorbidities Anxiety, Arthritis, Bipolar disorder, Depression, Fibromyalgia, Migraines,Had an abdominal hysterectomy, c-section, B hammer-toe repair, gastric bypass (needs revision), hernia repair, hip-surgery (labral repair), B knee surgery to regenerate meniscus, B oophorectomy, B shoulder surgeries for rotator cuff repair in 2012 and 2016, tonsillectomy, and  tubal ligation, obesity.    Examination-Activity Limitations  Lift;Stairs;Continence;Bend;Locomotion Level;Bathing;Bed Mobility;Squat;Stand;Caring for Others;Carry;Self Feeding;Dressing;Sleep;Transfers    Examination-Participation Restrictions Yard Work;Cleaning;Laundry;Community Activity;Driving;Occupation;Interpersonal Relationship;Meal Prep;Shop    Stability/Clinical Decision Making Evolving/Moderate complexity    Rehab Potential Good    PT Frequency 2x / week    PT Duration 12 weeks    PT Treatment/Interventions ADLs/Self Care Home Management;Moist Heat;Electrical Stimulation;Therapeutic activities;Functional mobility training;Stair training;Gait training;Therapeutic exercise;Balance training;Neuromuscular re-education;Patient/family education;Manual techniques;Dry needling;Taping;Spinal Manipulations;Joint Manipulations;Cryotherapy;Passive range of motion;Scar mobilization;Energy conservation;Manual lymph drainage    PT Next Visit Plan progressive functional LE strengthening and ROM    PT Home Exercise Plan Medbridge Access Code: [email or web address]    Consulted and Agree with Plan of Care Patient           Patient will benefit from skilled therapeutic intervention in order to improve the following deficits and impairments:  Abnormal gait,Decreased endurance,Difficulty walking,Obesity,Improper body mechanics,Pain,Postural dysfunction,Decreased strength,Decreased coordination,Decreased activity tolerance,Decreased skin integrity,Decreased scar mobility,Decreased mobility,Prosthetic Dependency,Impaired perceived functional ability,Hypomobility,Decreased range of motion,Decreased balance,Increased edema,Impaired flexibility  Visit Diagnosis: Chronic pain of right knee  Other idiopathic scoliosis, thoracolumbar region  Stiffness of right knee, not elsewhere classified  Abnormal posture  Muscle weakness (generalized)  Other muscle spasm  Other lack of coordination  Difficulty in walking, not elsewhere classified  History of falling     Problem  List Patient Active Problem List   Diagnosis Date Noted  . Status post total knee replacement, right 08/22/2020  . Primary osteoarthritis of right knee 08/21/2020  . Leg edema 06/22/2019  . Essential hypertension 06/22/2019  . Fatigue 06/22/2019    Lieutenant Diego PT, DPT 2:38 PM,11/10/20   [facility] [address] [place], [address] Phone: [phone]   Fax:  [phone]  Name: Kimberly Herring MRN: [id number] Date of Birth: [birth year]

## 2020-11-11 ENCOUNTER — Encounter: Payer: Self-pay | Admitting: Orthopaedic Surgery

## 2020-11-14 ENCOUNTER — Telehealth: Payer: Self-pay | Admitting: Orthopaedic Surgery

## 2020-11-14 ENCOUNTER — Encounter: Payer: BC Managed Care – PPO | Admitting: Physical Therapy

## 2020-11-14 NOTE — Telephone Encounter (Signed)
Pt called stating she needed to clarify the info that needs to be on her FMLA paperwork; she states Dr.Xu needs to read her previous mychart messages as well as her PT notes from 08/2020-09/2020; as well as anything regarding the R side of her leg. She would like a CB between 8-10 am. Pt is a bit agitated as she states sending messages through [place] is not working well.

## 2020-11-14 NOTE — Telephone Encounter (Signed)
Holding for [name] ?

## 2020-11-15 ENCOUNTER — Other Ambulatory Visit: Payer: Self-pay

## 2020-11-15 ENCOUNTER — Ambulatory Visit: Payer: BC Managed Care – PPO

## 2020-11-15 DIAGNOSIS — M25661 Stiffness of right knee, not elsewhere classified: Secondary | ICD-10-CM

## 2020-11-15 DIAGNOSIS — M25561 Pain in right knee: Secondary | ICD-10-CM | POA: Diagnosis not present

## 2020-11-15 DIAGNOSIS — G8929 Other chronic pain: Secondary | ICD-10-CM

## 2020-11-15 NOTE — Therapy (Signed)
[place] [facility] [address] [place], [address] Phone: [phone]   Fax:  [phone]  Physical Therapy Treatment  Patient Details  Name: Kimberly Herring MRN: [id number] Date of Birth: 1966-05-13 Referring Provider (PT): Naiping M. Erlinda Hong, MD   Encounter Date: 11/15/2020   PT End of Session - 11/15/20 1243    Visit Number 14    Number of Visits 24    Date for PT Re-Evaluation 12/07/20    Authorization Time Period 06/16/20 through 08/25/20    PT Start Time 1050    PT Stop Time 1115    PT Time Calculation (min) 25 min    Activity Tolerance Patient tolerated treatment well;Patient limited by pain    Behavior During Therapy [facility] for tasks assessed/performed           Past Medical History:  Diagnosis Date  . Anemia   . Anxiety   . Arthritis   . Asthma   . Bipolar 2 disorder ([place])   . Complication of anesthesia    hard to put to sleep  . Depression   . Family history of adverse reaction to anesthesia    Mother is hard to wake up  . Fibromyalgia   . Hypertension   . Hypoglycemia following gastrointestinal surgery    post-bariatric surgery hypoglycemia (followed at [facility])  . Lung nodules    lung nodules ~1999; diagnosed wtih "Legionnaires", reported follow-up imaging showed nodules had resolved but some "scar tissue" present  . Migraine aura, persistent   . 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 (Hematologist Dr. Sterling Big, [facility] at [facility])  . Pneumonia   . Undifferentiated connective tissue disease [facility])    [facility]    Past Surgical History:  Procedure Laterality Date  . ABDOMINAL HYSTERECTOMY    . CESAREAN SECTION    . ESOPHAGOGASTRODUODENOSCOPY    . FOOT SURGERY Bilateral   . GASTRIC BYPASS    . HERNIA REPAIR    . HIP SURGERY    . KNEE SURGERY Bilateral   . LAPAROSCOPIC OOPHERECTOMY    . SHOULDER SURGERY Bilateral   . TONSILLECTOMY    .  TOTAL KNEE ARTHROPLASTY Right 08/22/2020   Procedure: RIGHT TOTAL KNEE ARTHROPLASTY;  Surgeon: Leandrew Koyanagi, MD;  Location: [place];  Service: Orthopedics;  Laterality: Right;  . TUBAL LIGATION      There were no vitals filed for this visit.   Subjective Assessment - 11/15/20 1238    Subjective Pt reports her leg is sore, she was on her feet for a long amount of time on Sunday.    Pertinent History Patient is a 55 y.o. female who presents to outpatient physical therapy with a referral for medical diagnosis s/p R TKA. 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   Relevant past medical history and comorbidities include platelet storage pool disease (doesn't clot quickly enough, carries a medication to take in case she needs it), fibromyalgia, asthma (has rescue inhaler), lung nodules (being followed), connective tissue disease, bipolar 2 with hallucinations, depression (sees mental health therapist weekly), arthritis, anemia, previous pelvic floor PT, hernia repair, hip surgery,  bilateral knee surgery, bilateral shoulder surgery, hypertension, leg edema, fatigue. .  Patient denies hx of cancer, stroke, seizures, lung problem (except asthma and lung nodule being watched), major cardiac events (except clotting disorder), diabetes, unexplained weight loss, changes in bowel or bladder problems, new onset stumbling or dropping things apart from described below.    Limitations Lifting;Standing;Walking;House hold activities    Patient Stated Goals to get better    Currently in Pain? Yes    Pain Score 3             Treatment today: Abbreviated session today as pt arrived 20 min  late.   Therapeutic exercise: to centralize symptoms and improve ROM, strength, muscular endurance, and activity tolerance required for successful completion of functional activities.    - seated LAQ 3x10 each side: R 15# AW, R 10# AW.    - standing side stepping with black theraband around knees, 3x15 feet each side with supervision for safety.    - seated R AAROM flexion/extension rolling red soccer ball with foot, 2x20.           PT Education - 11/15/20 1243    Education Details exercise technique/form    Person(s) Educated Patient    Methods Explanation;Demonstration    Comprehension Verbalized understanding;Returned demonstration            PT Short Term Goals - 09/14/20 1245      PT SHORT TERM GOAL #1   Title Be independent with initial home exercise program for self-management of symptoms.    Baseline initial HEP provided at IE (09/14/2020);    Time 3    Period Weeks    Status New    Target Date 10/05/20             PT Long Term Goals - 11/01/20 1847      PT LONG TERM GOAL #1   Title Be independent with a long-term home exercise program for self-management of symptoms.    Baseline initial HEP provided at IE (09/14/2020); currently participating in appropriate HEP (11/01/2020);    Time 12    Period Weeks    Status Partially Met   TARGET DATE FOR ALL LONG TERM GOALS: 12/07/2020   Target Date --      PT LONG TERM GOAL #2   Title Patient will complete 5 Time Sit To Stand Test in equal or less than 12 seconds from 18.5 inch plinth with no UE support to improve ability to move around at home and at work while carrying items.    Baseline 13 seconds from 18.5 inch plinth to RW with B UE support on plinth (09/14/2020);  9.44 seconds from 18.5 inch plinth with no UE use. Did offload L LE some (11/01/2020);    Time 12    Period Weeks    Status Achieved      PT LONG TERM GOAL #3   Title Demonstrate improved FOTO score to equal or greater than 41 by visit #15 to  demonstrate improvement in overall condition and self-reported functional ability.    Baseline 32 (09/14/2020); 41 at visit 10(11/01/20);    Time 12    Period Weeks    Status Achieved      PT LONG TERM GOAL #4   Title Patient will demonstrate R knee PROM 0 - 120 to improve her ability to navigate over obstacles in her community without difficulty.    Baseline 0-103 (09/14/2020); 0-107 (11/01/2020);    Time 12    Period  Weeks    Status New      PT LONG TERM GOAL #5   Title Patient will ambulate equal or greater than 1000 feet using LRAD on the 6 Minute Walk Test to demonstrate improved community mobility.    Baseline to be tested session 2 as appropriate (09/14/2020); 09/14/2020 749f with RW (09/14/2020): 760 feet with SPC in L UE and supervision (11/01/2020);    Time 12    Period Weeks    Status Partially Met      PT LONG TERM GOAL #6   Title Complete community, work and/or recreational activities without limitation due to current condition.    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  She was able to perform therapeutic exercises today without c/o increased knee pain at end of session.    Personal Factors and Comorbidities Age;Comorbidity 3+;Past/Current Experience;Fitness;Time since onset of injury/illness/exacerbation;Transportation    Comorbidities Anxiety, Arthritis, Bipolar  disorder, Depression, Fibromyalgia, Migraines,Had an abdominal hysterectomy, c-section, B hammer-toe repair, gastric bypass (needs revision), hernia repair, hip-surgery (labral repair), B knee surgery to regenerate meniscus, B oophorectomy, B shoulder surgeries for rotator cuff repair in 2012 and 2016, tonsillectomy, and  tubal ligation, obesity.    Examination-Activity Limitations Lift;Stairs;Continence;Bend;Locomotion Level;Bathing;Bed Mobility;Squat;Stand;Caring for Others;Carry;Self Feeding;Dressing;Sleep;Transfers    Examination-Participation Restrictions Yard Work;Cleaning;Laundry;Community Activity;Driving;Occupation;Interpersonal Relationship;Meal Prep;Shop    Stability/Clinical Decision Making Evolving/Moderate complexity    Rehab Potential Good    PT Frequency 2x / week    PT Duration 12 weeks    PT Treatment/Interventions ADLs/Self Care Home Management;Moist Heat;Electrical Stimulation;Therapeutic activities;Functional mobility training;Stair training;Gait training;Therapeutic exercise;Balance training;Neuromuscular re-education;Patient/family education;Manual techniques;Dry needling;Taping;Spinal Manipulations;Joint Manipulations;Cryotherapy;Passive range of motion;Scar mobilization;Energy conservation;Manual lymph drainage    PT Next Visit Plan progressive functional LE strengthening and ROM    PT Home Exercise Plan Medbridge Access Code: [email or web address]   Consulted and Agree with Plan of Care Patient           Patient will benefit from skilled therapeutic intervention in order to improve the following deficits and impairments:  Abnormal gait,Decreased endurance,Difficulty walking,Obesity,Improper body mechanics,Pain,Postural dysfunction,Decreased strength,Decreased coordination,Decreased activity tolerance,Decreased skin integrity,Decreased scar mobility,Decreased mobility,Prosthetic Dependency,Impaired perceived functional ability,Hypomobility,Decreased range of motion,Decreased  balance,Increased edema,Impaired flexibility  Visit Diagnosis: Chronic pain of right knee  Stiffness of right knee, not elsewhere classified     Problem List Patient Active Problem List   Diagnosis Date Noted  . Status post total knee replacement, right 08/22/2020  . Primary osteoarthritis of right knee 08/21/2020  . Leg edema 06/22/2019  . Essential hypertension 06/22/2019  . Fatigue 06/22/2019    RPincus Badder2/22/2022, 12:48 PM RMerdis Delay PT, DPT Physical Therapist - [facility] [address] [place] [id number]: [phone]  Fax:  [phone] Name: Kimberly  Davene Herring MRN: [id number] Date of Birth: [birth year]

## 2020-11-15 NOTE — Telephone Encounter (Signed)
Left voice mail

## 2020-11-15 NOTE — Telephone Encounter (Signed)
Please call patient. She only wants to speak to you. See previous message.

## 2020-11-16 ENCOUNTER — Ambulatory Visit: Payer: Self-pay

## 2020-11-16 ENCOUNTER — Encounter: Payer: BC Managed Care – PPO | Admitting: Physical Therapy

## 2020-11-16 ENCOUNTER — Ambulatory Visit (INDEPENDENT_AMBULATORY_CARE_PROVIDER_SITE_OTHER): Payer: BC Managed Care – PPO | Admitting: Orthopaedic Surgery

## 2020-11-16 ENCOUNTER — Ambulatory Visit: Payer: BC Managed Care – PPO | Admitting: Physical Therapy

## 2020-11-16 ENCOUNTER — Encounter: Payer: Self-pay | Admitting: Orthopaedic Surgery

## 2020-11-16 ENCOUNTER — Encounter: Payer: Self-pay | Admitting: Physical Therapy

## 2020-11-16 DIAGNOSIS — M25661 Stiffness of right knee, not elsewhere classified: Secondary | ICD-10-CM

## 2020-11-16 DIAGNOSIS — M25561 Pain in right knee: Secondary | ICD-10-CM | POA: Diagnosis not present

## 2020-11-16 DIAGNOSIS — G8929 Other chronic pain: Secondary | ICD-10-CM

## 2020-11-16 DIAGNOSIS — Z96651 Presence of right artificial knee joint: Secondary | ICD-10-CM

## 2020-11-16 NOTE — Progress Notes (Signed)
Office Visit Note   Patient: Kimberly Herring           Date of Birth: 01-19-1966           MRN: [id number] Visit Date: 11/16/2020              Requested by: Kimberly Shire, MD [address],  [place] [id number] PCP: Kimberly Shire, MD   Assessment & Plan: Visit Diagnoses:  1. History of total knee replacement, right     Plan: Impression is 3 months status post right total knee replacement.  She has returned back to work.  I feel that the symptoms are to be expected with reentry into the workplace given the history.  I wrote a letter of appeal.  Dental prophylaxis reinforced.  Follow-up in 3 months with two-view x-rays of the right knee.  Total face to face encounter time was greater than 25 minutes and over half of this time was spent in counseling and/or coordination of care.  Follow-Up Instructions: Return in about 3 months (around 02/13/2021).   Orders:  No orders of the defined types were placed in this encounter.  No orders of the defined types were placed in this encounter.     Procedures: No procedures performed   Clinical Data: No additional findings.   Subjective: Chief Complaint  Patient presents with  . Right Knee - Pain    Kimberly Herring is a little over 3 months status post right total knee replacement.  She has returned back to work.  She is still complaining of some mild swelling and pain and fatigue with increased activity and standing.   Review of Systems   Objective: Vital Signs: There were no vitals taken for this visit.  Physical Exam  Ortho Exam Right knee shows a fully healed surgical scar.  Range of motion 0 to 105 degrees.  Collaterals are stable.  No signs of infection.  Mild pain with range of motion. Specialty Comments:  No specialty comments available.  Imaging: No results found.   PMFS History: Patient Active Problem List   Diagnosis Date Noted  . Status post total knee  replacement, right 08/22/2020  . Primary osteoarthritis of right knee 08/21/2020  . Leg edema 06/22/2019  . Essential hypertension 06/22/2019  . Fatigue 06/22/2019   Past Medical History:  Diagnosis Date  . Anemia   . Anxiety   . Arthritis   . Asthma   . Bipolar 2 disorder ([place])   . Complication of anesthesia    hard to put to sleep  . Depression   . Family history of adverse reaction to anesthesia    Mother is hard to wake up  . Fibromyalgia   . Hypertension   . Hypoglycemia following gastrointestinal surgery    post-bariatric surgery hypoglycemia (followed at [facility])  . Lung nodules    lung nodules ~1999; diagnosed wtih "Legionnaires", reported follow-up imaging showed nodules had resolved but some "scar tissue" present  . Migraine aura, persistent   . 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 (Hematologist Dr. Sterling Big, MD at [facility])  . Pneumonia   . Undifferentiated connective tissue disease [facility])    [facility]    History reviewed. No pertinent family history.  Past Surgical History:  Procedure Laterality Date  . ABDOMINAL HYSTERECTOMY    . CESAREAN SECTION    . ESOPHAGOGASTRODUODENOSCOPY    . FOOT SURGERY Bilateral   .  GASTRIC BYPASS    . HERNIA REPAIR    . HIP SURGERY    . KNEE SURGERY Bilateral   . LAPAROSCOPIC OOPHERECTOMY    . SHOULDER SURGERY Bilateral   . TONSILLECTOMY    . TOTAL KNEE ARTHROPLASTY Right 08/22/2020   Procedure: RIGHT TOTAL KNEE ARTHROPLASTY;  Surgeon: Kimberly Koyanagi, MD;  Location: [place];  Service: Orthopedics;  Laterality: Right;  . TUBAL LIGATION     Social History   Occupational History  . Not on file  Tobacco Use  . Smoking status: Never Smoker  . Smokeless tobacco: Never Used  Vaping Use  . Vaping Use: Never used  Substance and Sexual Activity  . Alcohol use: Yes    Comment: socially  . Drug use: No  . Sexual activity: Not on file

## 2020-11-16 NOTE — Therapy (Signed)
[facility] [address] [place], [address] Phone: [phone]   Fax:  [phone]  Physical Therapy Treatment  Patient Details  Name: Kimberly Herring MRN: [id number] Date of Birth: 1966/07/24 Referring Provider (PT): Naiping M. Erlinda Hong, MD   Encounter Date: 11/16/2020   PT End of Session - 11/16/20 0917    Visit Number 15    Number of Visits 24    Date for PT Re-Evaluation 12/07/20    Authorization Type [employer] reporting period from 11/01/2020    Authorization Time Period 06/16/20 through 08/25/20    Authorization - Visit Number 4    Authorization - Number of Visits 10    Progress Note Due on Visit 20    PT Start Time 0815    PT Stop Time 0900    PT Time Calculation (min) 45 min    Activity Tolerance Patient tolerated treatment well    Behavior During Therapy [facility] for tasks assessed/performed           Past Medical History:  Diagnosis Date  . Anemia   . Anxiety   . Arthritis   . Asthma   . Bipolar 2 disorder ([place])   . Complication of anesthesia    hard to put to sleep  . Depression   . Family history of adverse reaction to anesthesia    Mother is hard to wake up  . Fibromyalgia   . Hypertension   . Hypoglycemia following gastrointestinal surgery    post-bariatric surgery hypoglycemia (followed at [facility])  . Lung nodules    lung nodules ~1999; diagnosed wtih "Legionnaires", reported follow-up imaging showed nodules had resolved but some "scar tissue" present  . Migraine aura, persistent   . 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 (Hematologist Dr. Sterling Big, MD at [facility])  . Pneumonia   . Undifferentiated connective tissue disease [facility])    [facility]    Past Surgical History:  Procedure Laterality Date  . ABDOMINAL HYSTERECTOMY    . CESAREAN SECTION    . ESOPHAGOGASTRODUODENOSCOPY    . FOOT SURGERY Bilateral   . GASTRIC  BYPASS    . HERNIA REPAIR    . HIP SURGERY    . KNEE SURGERY Bilateral   . LAPAROSCOPIC OOPHERECTOMY    . SHOULDER SURGERY Bilateral   . TONSILLECTOMY    . TOTAL KNEE ARTHROPLASTY Right 08/22/2020   Procedure: RIGHT TOTAL KNEE ARTHROPLASTY;  Surgeon: Leandrew Koyanagi, MD;  Location: [place];  Service: Orthopedics;  Laterality: Right;  . TUBAL LIGATION      There were no vitals filed for this visit.       TREATMENT:       Therapeutic exercise: to centralize symptoms and improve ROM, strength, muscular endurance, and activity tolerance required for successful completion of functional activities.    - Nustep seat 9 UE 8 67mns L5 for gentle strengthening of BLE  - ambulation for distance in 6 minute [facility]: 700 feet    - OMEGA knee flex 25# 2x 10/8; 35# x6 with cuing for "LLE assist" and eccentric control  PT reviewed the following HEP with patient with patient able to demonstrate a set of the following with min cuing for correction needed. PT educated patient on parameters of therex (how/when to inc/decrease intensity, frequency, rep/set range, stretch hold time, and purpose of therex) with verbalized understanding.  Mini  Squat with Counter Support - 1 x daily - 7 x weekly - 3 sets - 10 reps Seated Hamstring Curl with Anchored Resistance - 1 x daily - 7 x weekly - 3 sets - 10 reps Seated Knee Flexion Extension AROM - 3 x daily - 7 x weekly - 3-5 reps - 15sec hold Standing Gastroc Stretch at Counter - 1 x daily - 2 reps - 30-60 seconds hold Standing Soleus Stretch at Counter - 1 x daily - 2 reps - 30-60 seconds hold   Manual STM with trigger point release to R lateral gastroc/soleus  following: Dry Needling: (4) 87m .25 needles placed along the R lateral gastroc/soleus/fibularis longus/post tib to decrease increased muscular spasms and trigger points with the patient positioned in prone. Patient was educated on risks and benefits of therapy and  verbally consents to PT.  Prone contract/relax for increased flex 10sec contract 30sec relax x3 Seated tib/fib inferior distraction with passive flexion x12   Flex P/AROM following: 117/107                         PT Education - 11/16/20 0918    Education Details HEP, TDN, therex technique    Person(s) Educated Patient    Methods Explanation;Demonstration;Verbal cues    Comprehension Verbalized understanding;Returned demonstration;Verbal cues required            PT Short Term Goals - 09/14/20 1245      PT SHORT TERM GOAL #1   Title Be independent with initial home exercise program for self-management of symptoms.    Baseline initial HEP provided at IE (09/14/2020);    Time 3    Period Weeks    Status New    Target Date 10/05/20             PT Long Term Goals - 11/01/20 1847      PT LONG TERM GOAL #1   Title Be independent with a long-term home exercise program for self-management of symptoms.    Baseline initial HEP provided at IE (09/14/2020); currently participating in appropriate HEP (11/01/2020);    Time 12    Period Weeks    Status Partially Met   TARGET DATE FOR ALL LONG TERM GOALS: 12/07/2020   Target Date --      PT LONG TERM GOAL #2   Title Patient will complete 5 Time Sit To Stand Test in equal or less than 12 seconds from 18.5 inch plinth with no UE support to improve ability to move around at home and at work while carrying items.    Baseline 13 seconds from 18.5 inch plinth to RW with B UE support on plinth (09/14/2020);  9.44 seconds from 18.5 inch plinth with no UE use. Did offload L LE some (11/01/2020);    Time 12    Period Weeks    Status Achieved      PT LONG TERM GOAL #3   Title Demonstrate improved FOTO score to equal or greater than 41 by visit #15 to demonstrate improvement in overall condition and self-reported functional ability.    Baseline 32 (09/14/2020); 41 at visit 10(11/01/20);    Time 12    Period Weeks    Status  Achieved      PT LONG TERM GOAL #4   Title Patient will demonstrate R knee PROM 0 - 120 to improve her ability to navigate over obstacles in her community without difficulty.    Baseline 0-103 (09/14/2020); 0-107 (  11/01/2020);    Time 12    Period Weeks    Status New      PT LONG TERM GOAL #5   Title Patient will ambulate equal or greater than 1000 feet using LRAD on the 6 Minute Walk Test to demonstrate improved community mobility.    Baseline to be tested session 2 as appropriate (09/14/2020); 09/14/2020 755f with RW (09/14/2020): 760 feet with SPC in L UE and supervision (11/01/2020);    Time 12    Period Weeks    Status Partially Met      PT LONG TERM GOAL #6   Title Complete community, work and/or recreational activities without limitation due to current condition.    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 On assessment, patient with palpable trigger points with concordant pain sign to area, appropriate for TDN. Patient with localized twitch response, with decreased guarding allowing for increased mobility following, which patient is pleased with. PT continued therex progression for increased flexion strengthening in increased range with success. PT reviewed HEP with some modifications made with patient able to demonstrate good understanding of HEP recommendations. Pt returns to surgeon today,  PT will await further intervention recommendations to continue POC. PT will continue progression as able.    Personal Factors and Comorbidities Age;Comorbidity 3+;Past/Current Experience;Fitness;Time since onset of injury/illness/exacerbation;Transportation    Comorbidities Anxiety, Arthritis, Bipolar disorder, Depression, Fibromyalgia, Migraines,Had an abdominal hysterectomy, c-section, B hammer-toe repair, gastric bypass (needs revision), hernia repair, hip-surgery (labral repair), B knee surgery to regenerate meniscus, B oophorectomy, B shoulder surgeries for rotator cuff repair in 2012 and 2016, tonsillectomy, and  tubal ligation, obesity.    Examination-Activity Limitations Lift;Stairs;Continence;Bend;Locomotion Level;Bathing;Bed Mobility;Squat;Stand;Caring for Others;Carry;Self Feeding;Dressing;Sleep;Transfers    Examination-Participation Restrictions Yard Work;Cleaning;Laundry;Community Activity;Driving;Occupation;Interpersonal Relationship;Meal Prep;Shop    Stability/Clinical Decision Making Evolving/Moderate complexity    Clinical Decision Making Moderate    Rehab Potential Good    PT Frequency 2x / week    PT Duration 12 weeks    PT Treatment/Interventions ADLs/Self Care Home Management;Moist Heat;Electrical Stimulation;Therapeutic activities;Functional mobility training;Stair training;Gait training;Therapeutic exercise;Balance training;Neuromuscular re-education;Patient/family education;Manual techniques;Dry needling;Taping;Spinal Manipulations;Joint Manipulations;Cryotherapy;Passive range of motion;Scar mobilization;Energy conservation;Manual lymph drainage    PT Next Visit Plan progressive functional LE strengthening and ROM    PT Home Exercise Plan Medbridge Access Code: [email or web address]   Consulted and Agree with Plan of Care Patient           Patient will benefit from skilled therapeutic intervention in order to improve the following deficits and impairments:  Abnormal gait,Decreased  endurance,Difficulty walking,Obesity,Improper body mechanics,Pain,Postural dysfunction,Decreased strength,Decreased coordination,Decreased activity tolerance,Decreased skin integrity,Decreased scar mobility,Decreased mobility,Prosthetic Dependency,Impaired perceived functional ability,Hypomobility,Decreased range of motion,Decreased balance,Increased edema,Impaired flexibility  Visit Diagnosis: Chronic pain of right knee  Stiffness of right knee, not elsewhere classified     Problem List Patient Active Problem List   Diagnosis Date Noted  .  Status post total knee replacement, right 08/22/2020  . Primary osteoarthritis of right knee 08/21/2020  . Leg edema 06/22/2019  . Essential hypertension 06/22/2019  . Fatigue 06/22/2019   Durwin Reges DPT Durwin Reges 11/16/2020, 9:18 AM  [facility] [address], [place], [address] Phone: [phone]   Fax:  [phone]  Name: Kimberly Herring MRN: [id number] Date of Birth: 09/05/1966

## 2020-11-17 ENCOUNTER — Encounter: Payer: BC Managed Care – PPO | Admitting: Physical Therapy

## 2020-11-18 ENCOUNTER — Encounter: Payer: BC Managed Care – PPO | Admitting: Physical Therapy

## 2020-11-18 ENCOUNTER — Telehealth: Payer: Self-pay | Admitting: *Deleted

## 2020-11-18 NOTE — Telephone Encounter (Signed)
Pearl with [facility] called stating order has been on workq since Feb 11 and see that it has been worked out but pt has been returning call to get scheduled for VASCULAR US, Do you still want to go the [place] since it has been 2 weeks and patient has been in since the order was placed? Please advise

## 2020-11-18 NOTE — Telephone Encounter (Signed)
No it's fine.  She's doing ok.

## 2020-11-21 ENCOUNTER — Encounter: Payer: BC Managed Care – PPO | Admitting: Physical Therapy

## 2020-11-21 NOTE — Telephone Encounter (Signed)
Order has been cancelled.  

## 2020-11-22 ENCOUNTER — Encounter: Payer: Self-pay | Admitting: Physical Therapy

## 2020-11-22 ENCOUNTER — Ambulatory Visit: Payer: BC Managed Care – PPO | Attending: Obstetrics and Gynecology | Admitting: Physical Therapy

## 2020-11-22 ENCOUNTER — Other Ambulatory Visit: Payer: Self-pay

## 2020-11-22 DIAGNOSIS — M25661 Stiffness of right knee, not elsewhere classified: Secondary | ICD-10-CM | POA: Insufficient documentation

## 2020-11-22 DIAGNOSIS — M4125 Other idiopathic scoliosis, thoracolumbar region: Secondary | ICD-10-CM | POA: Diagnosis present

## 2020-11-22 DIAGNOSIS — R293 Abnormal posture: Secondary | ICD-10-CM | POA: Insufficient documentation

## 2020-11-22 DIAGNOSIS — M25561 Pain in right knee: Secondary | ICD-10-CM | POA: Insufficient documentation

## 2020-11-22 DIAGNOSIS — G8929 Other chronic pain: Secondary | ICD-10-CM | POA: Insufficient documentation

## 2020-11-22 NOTE — Therapy (Signed)
[facility] [address], [place], [address] Phone: [phone]   Fax:  [phone]  Physical Therapy Treatment  Patient Details  Name: Kimberly Herring MRN: [id number] Date of Birth: 08/06/1966 Referring Provider (PT): Naiping M. Erlinda Hong, MD   Encounter Date: 11/22/2020   PT End of Session - 11/22/20 1036    Visit Number 16    Number of Visits 24    Date for PT Re-Evaluation 12/07/20    Authorization Type [employer] reporting period from 11/01/2020    Authorization Time Period 06/16/20 through 08/25/20    Authorization - Visit Number 5    Authorization - Number of Visits 10    Progress Note Due on Visit 20    PT Start Time 1030    PT Stop Time 1115    PT Time Calculation (min) 45 min    Activity Tolerance Patient tolerated treatment well    Behavior During Therapy [facility] for tasks assessed/performed           Past Medical History:  Diagnosis Date  . Anemia   . Anxiety   . Arthritis   . Asthma   . Bipolar 2 disorder ([place])   . Complication of anesthesia    hard to put to sleep  . Depression   . Family history of adverse reaction to anesthesia    Mother is hard to wake up  . Fibromyalgia   . Hypertension   . Hypoglycemia following gastrointestinal surgery    post-bariatric surgery hypoglycemia (followed at [facility])  . Lung nodules    lung nodules ~1999; diagnosed wtih "Legionnaires", reported follow-up imaging showed nodules had resolved but some "scar tissue" present  . Migraine aura, persistent   . 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 (Hematologist Dr. Sterling Big, MD at [facility])  . Pneumonia   . Undifferentiated connective tissue disease [facility])    [facility]    Past Surgical History:  Procedure Laterality Date  . ABDOMINAL HYSTERECTOMY    . CESAREAN SECTION    . ESOPHAGOGASTRODUODENOSCOPY    . FOOT SURGERY Bilateral   . GASTRIC  BYPASS    . HERNIA REPAIR    . HIP SURGERY    . KNEE SURGERY Bilateral   . LAPAROSCOPIC OOPHERECTOMY    . SHOULDER SURGERY Bilateral   . TONSILLECTOMY    . TOTAL KNEE ARTHROPLASTY Right 08/22/2020   Procedure: RIGHT TOTAL KNEE ARTHROPLASTY;  Surgeon: Leandrew Koyanagi, MD;  Location: [place];  Service: Orthopedics;  Laterality: Right;  . TUBAL LIGATION      There were no vitals filed for this visit.   Subjective Assessment - 11/22/20 1033    Subjective Reports that she felt much better following TDN, but that some pain returned over the past 2 days, but reports 3-4/10 pain reduced from 9/10 which she is happy with. Returned to MD next week, reporting he wanted to see a little more motion. Is having L knee pain today, is planning to have the L knee replaced this summer    Pertinent History Patient is a 55 y.o. female who presents to outpatient physical therapy with a referral for medical diagnosis s/p R TKA. 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   Relevant past medical history and comorbidities include platelet storage pool disease (doesn't clot quickly enough, carries a medication to take in case she needs it), fibromyalgia, asthma (has rescue inhaler), lung nodules (being followed), connective tissue disease, bipolar 2 with hallucinations, depression (sees mental health therapist weekly), arthritis, anemia, previous pelvic floor PT, hernia repair, hip surgery, bilateral knee surgery, bilateral shoulder surgery, hypertension, leg edema, fatigue. .  Patient denies hx of cancer, stroke, seizures, lung problem (except asthma and lung nodule being watched), major cardiac events (except  clotting disorder), diabetes, unexplained weight loss, changes in bowel or bladder problems, new onset stumbling or dropping things apart from described below.    Limitations Lifting;Standing;Walking;House hold activities    Patient Stated Goals to get better    Pain Onset More than a month ago           Therapeutic exercise: to centralize symptoms and improve ROM, strength, muscular endurance, and activity tolerance required for successful completion of functional activities.   - Nustep seat 9 UE 8 49mns L5 for gentle strengthening of BLE  - TRX squat 3x 10 with cuing for proper technique with encouragement for increased knee flex with good carry over  RLE lunge x10 but unable to complete with proper technique d/t L knee pain  RLE step up onto 8in step 2x 10 with difficulty with eccentric lower L heel to ground  OMEGA leg press 45# x5; 55# 2x 5 with min cuing for eccentric control with good carry over - OMEGA knee flex RLE ONLY 20# x8; 25# 2x 8 with min cuing for full available flex with good carry over     Manual STM with trigger point release to R lateral gastroc/soleus  following: Dry Needling: (4) 672m.25 needles placed along the R lateral gastroc/soleus/fibularis longus/post tib to decrease increased muscular spasms and trigger points with the patient positioned in prone. Patient was educated on risks and benefits of therapy and verbally consents to PT.  Seated tib/fib inferior distraction with passive flexion x12   Flex P/AROM following: 124/112            PT Education - 11/22/20 1035    Education Details TDN, therex technique/form    Person(s) Educated Patient    Methods Explanation;Demonstration;Verbal cues    Comprehension Verbalized understanding;Returned demonstration;Verbal cues required            PT Short Term Goals - 09/14/20 1245      PT SHORT TERM GOAL #1   Title Be independent with initial home exercise program for self-management of  symptoms.    Baseline initial HEP provided at IE (09/14/2020);    Time 3    Period Weeks    Status New    Target Date 10/05/20             PT Long Term Goals - 11/01/20 1847      PT LONG TERM GOAL #1   Title Be independent with a long-term home exercise program for self-management of symptoms.    Baseline initial HEP provided at [facility] (09/14/2020); currently participating in appropriate HEP (11/01/2020);    Time 12    Period Weeks    Status Partially Met   TARGET DATE FOR ALL LONG TERM GOALS: 12/07/2020   Target Date --      PT LONG TERM GOAL #2   Title Patient will complete 5 Time  Sit To Stand Test in equal or less than 12 seconds from 18.5 inch plinth with no UE support to improve ability to move around at home and at work while carrying items.    Baseline 13 seconds from 18.5 inch plinth to RW with B UE support on plinth (09/14/2020);  9.44 seconds from 18.5 inch plinth with no UE use. Did offload L LE some (11/01/2020);    Time 12    Period Weeks    Status Achieved      PT LONG TERM GOAL #3   Title Demonstrate improved FOTO score to equal or greater than 41 by visit #15 to demonstrate improvement in overall condition and self-reported functional ability.    Baseline 32 (09/14/2020); 41 at visit 10(11/01/20);    Time 12    Period Weeks    Status Achieved      PT LONG TERM GOAL #4   Title Patient will demonstrate R knee PROM 0 - 120 to improve her ability to navigate over obstacles in her community without difficulty.    Baseline 0-103 (09/14/2020); 0-107 (11/01/2020);    Time 12    Period Weeks    Status New      PT LONG TERM GOAL #5   Title Patient will ambulate equal or greater than 1000 feet using LRAD on the 6 Minute Walk Test to demonstrate improved community mobility.    Baseline to be tested session 2 as appropriate (09/14/2020); 09/14/2020 759f with RW (09/14/2020): 760 feet with SPC in L UE and supervision (11/01/2020);    Time 12    Period Weeks    Status Partially Met       PT LONG TERM GOAL #6   Title Complete community, work and/or recreational activities without limitation due to current condition.    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 Patient is able to comply with all cuing for proper technique of therex with good motivation throughout session. PT is able to progress strengthening interventions for RLE in lieu of pain, which is an improvement from last session. Patient continues to be limited by L knee pain, which she is seeking surgery for as well. PT will continue progression as able.    Personal Factors and Comorbidities Age;Comorbidity 3+;Past/Current Experience;Fitness;Time since onset of injury/illness/exacerbation;Transportation    Comorbidities Anxiety, Arthritis, Bipolar disorder, Depression, Fibromyalgia, Migraines,Had an abdominal hysterectomy, c-section, B hammer-toe repair, gastric bypass (needs revision), hernia repair, hip-surgery (labral repair), B knee surgery to regenerate meniscus, B oophorectomy, B shoulder surgeries for rotator cuff repair in 2012 and 2016, tonsillectomy, and  tubal ligation, obesity.    Examination-Activity Limitations Lift;Stairs;Continence;Bend;Locomotion Level;Bathing;Bed Mobility;Squat;Stand;Caring for Others;Carry;Self Feeding;Dressing;Sleep;Transfers     Examination-Participation Restrictions Yard Work;Cleaning;Laundry;Community Activity;Driving;Occupation;Interpersonal Relationship;Meal Prep;Shop    Stability/Clinical Decision Making Evolving/Moderate complexity    Clinical Decision Making  Moderate    Rehab Potential Good    PT Frequency 2x / week    PT Duration 12 weeks    PT Treatment/Interventions ADLs/Self Care Home Management;Moist Heat;Electrical Stimulation;Therapeutic activities;Functional mobility training;Stair training;Gait training;Therapeutic exercise;Balance training;Neuromuscular re-education;Patient/family education;Manual techniques;Dry needling;Taping;Spinal Manipulations;Joint Manipulations;Cryotherapy;Passive range of motion;Scar mobilization;Energy conservation;Manual lymph drainage    PT Next Visit Plan progressive functional LE strengthening and ROM    PT Home Exercise Plan Medbridge Access Code: [email or web address]    Consulted and Agree with Plan of Care Patient           Patient will benefit from skilled therapeutic intervention in order to improve the following deficits and impairments:  Abnormal gait,Decreased endurance,Difficulty walking,Obesity,Improper body mechanics,Pain,Postural dysfunction,Decreased strength,Decreased coordination,Decreased activity tolerance,Decreased skin integrity,Decreased scar mobility,Decreased mobility,Prosthetic Dependency,Impaired perceived functional ability,Hypomobility,Decreased range of motion,Decreased balance,Increased edema,Impaired flexibility  Visit Diagnosis: Chronic pain of right knee  Stiffness of right knee, not elsewhere classified     Problem List Patient Active Problem List   Diagnosis Date Noted  . Status post total knee replacement, right 08/22/2020  . Primary osteoarthritis of right knee 08/21/2020  . Leg edema 06/22/2019  . Essential hypertension 06/22/2019  . Fatigue 06/22/2019   Durwin Reges DPT Durwin Reges 11/22/2020, 11:41 AM  [facility] [address] [place], [address] Phone: [phone]   Fax:  [phone]  Name: Christian Treadway MRN: [id number] Date of Birth: March 18, 1966

## 2020-11-25 ENCOUNTER — Ambulatory Visit: Payer: BC Managed Care – PPO | Admitting: Physical Therapy

## 2020-11-25 ENCOUNTER — Encounter: Payer: Self-pay | Admitting: Physical Therapy

## 2020-11-25 ENCOUNTER — Other Ambulatory Visit: Payer: Self-pay

## 2020-11-25 DIAGNOSIS — M25561 Pain in right knee: Secondary | ICD-10-CM | POA: Diagnosis not present

## 2020-11-25 DIAGNOSIS — M25661 Stiffness of right knee, not elsewhere classified: Secondary | ICD-10-CM

## 2020-11-25 DIAGNOSIS — G8929 Other chronic pain: Secondary | ICD-10-CM

## 2020-11-25 DIAGNOSIS — M4125 Other idiopathic scoliosis, thoracolumbar region: Secondary | ICD-10-CM

## 2020-11-25 DIAGNOSIS — R293 Abnormal posture: Secondary | ICD-10-CM

## 2020-11-25 NOTE — Therapy (Signed)
[place] [facility] [address] [place], [address] Phone: [phone]   Fax:  [phone]  Physical Therapy Treatment  Patient Details  Name: Kimberly Herring MRN: [id number] Date of Birth: December 11, 1965 Referring Provider (PT): Naiping M. Erlinda Hong, MD   Encounter Date: 11/25/2020   PT End of Session - 11/25/20 1003    Visit Number 17    Number of Visits 24    Date for PT Re-Evaluation 12/07/20    Authorization Type [employer] reporting period from 11/01/2020    Authorization Time Period 06/16/20 through 08/25/20    Authorization - Visit Number 6    Authorization - Number of Visits 10    Progress Note Due on Visit 20    PT Start Time 0900    PT Stop Time 0941    PT Time Calculation (min) 41 min    Activity Tolerance Patient tolerated treatment well    Behavior During Therapy [facility] for tasks assessed/performed           Past Medical History:  Diagnosis Date  . Anemia   . Anxiety   . Arthritis   . Asthma   . Bipolar 2 disorder ([place])   . Complication of anesthesia    hard to put to sleep  . Depression   . Family history of adverse reaction to anesthesia    Mother is hard to wake up  . Fibromyalgia   . Hypertension   . Hypoglycemia following gastrointestinal surgery    post-bariatric surgery hypoglycemia (followed at [facility])  . Lung nodules    lung nodules ~1999; diagnosed wtih "Legionnaires", reported follow-up imaging showed nodules had resolved but some "scar tissue" present  . Migraine aura, persistent   . 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 (Hematologist Dr. Sterling Big, [facility] at [facility])  . Pneumonia   . Undifferentiated connective tissue disease [facility])    [facility]    Past Surgical History:  Procedure Laterality Date  . ABDOMINAL HYSTERECTOMY    . CESAREAN SECTION    . ESOPHAGOGASTRODUODENOSCOPY    . FOOT SURGERY Bilateral   . GASTRIC  BYPASS    . HERNIA REPAIR    . HIP SURGERY    . KNEE SURGERY Bilateral   . LAPAROSCOPIC OOPHERECTOMY    . SHOULDER SURGERY Bilateral   . TONSILLECTOMY    . TOTAL KNEE ARTHROPLASTY Right 08/22/2020   Procedure: RIGHT TOTAL KNEE ARTHROPLASTY;  Surgeon: Leandrew Koyanagi, MD;  Location: [place];  Service: Orthopedics;  Laterality: Right;  . TUBAL LIGATION      There were no vitals filed for this visit.   Subjective Assessment - 11/25/20 0846    Subjective Patient reports she got out of bed too quick and slipped and fell Wednesday, was unable to complete HEP d/t this. She walked without her cane into a restaurant yesterday which she is happy with. R knee pain this am 3/10. L knee is still bothering her a lot.    Pertinent History Patient is a 55 y.o. female who presents to outpatient physical therapy with a referral for medical diagnosis s/p R TKA. 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   Relevant past medical history and comorbidities include platelet storage pool disease (doesn't clot quickly enough, carries a medication to take in case she needs it), fibromyalgia, asthma (has rescue inhaler), lung nodules (being followed), connective tissue disease, bipolar 2 with hallucinations, depression (sees mental health therapist weekly), arthritis, anemia, previous pelvic floor PT, hernia repair, hip surgery, bilateral knee surgery, bilateral shoulder surgery, hypertension, leg edema, fatigue. .  Patient denies hx of cancer, stroke, seizures, lung problem (except asthma and lung nodule being watched), major cardiac events (except clotting disorder), diabetes, unexplained weight loss, changes in  bowel or bladder problems, new onset stumbling or dropping things apart from described below.    Limitations Lifting;Standing;Walking;House hold activities    Patient Stated Goals to get better    Pain Onset More than a month ago               Ther-Ex   - Nustep seat 9 UE 8 32mns L5 for gentle strengthening of BLE  - TRX squat 3x 10 with light tap to 12in step with cuing for increase RLE push, active knee flex 105d  -RLE step up onto 8in step x10; 12in  with difficulty with eccentric lower L heel to ground  OMEGA leg press 55# 3x 6/8; 65# x5 with min cuing for eccentric control with good carry over  -OMEGA knee flex RLE ONLY 25# 2x 10 with min cuing for full available flex with good carry over     Manual STM withtrigger point releaseto R lateral gastroc/soleus  following:Dry Needling: (3) 61m.25 needles placed along the Rlateral gastroc/soleus/fibularis longus/post tibto decrease increased muscular spasms and trigger points with the patient positioned in prone. Patient was educated on risks and benefits of therapy and verbally consents to PT. Seated tib/fib inferior distraction with passive flexion x12   Flex P/AROM following: 132/127             PT Education - 11/25/20 1003    Education Details TDN, therex technique/form    Person(s) Educated Patient    Methods Explanation;Demonstration;Verbal cues    Comprehension Verbalized understanding;Returned demonstration;Verbal cues required            PT Short Term Goals - 09/14/20 1245      PT SHORT TERM GOAL #1   Title Be independent with initial home exercise program for self-management of symptoms.    Baseline initial HEP provided at [facility] (09/14/2020);    Time 3    Period Weeks    Status New    Target Date 10/05/20             PT Long Term Goals - 11/01/20 1847      PT LONG TERM GOAL #1   Title Be independent with a long-term home exercise program for self-management of symptoms.     Baseline initial HEP provided at [facility] (09/14/2020); currently participating in appropriate HEP (11/01/2020);    Time 12    Period Weeks    Status Partially Met   TARGET DATE FOR ALL LONG TERM GOALS: 12/07/2020   Target Date --      PT LONG TERM GOAL #2   Title Patient will complete 5 Time Sit To Stand Test in equal or less than 12 seconds from 18.5 inch plinth with no UE support to improve ability to move around at home and at work while carrying items.    Baseline 13 seconds from 18.5 inch plinth to RW with B  UE support on plinth (09/14/2020);  9.44 seconds from 18.5 inch plinth with no UE use. Did offload L LE some (11/01/2020);    Time 12    Period Weeks    Status Achieved      PT LONG TERM GOAL #3   Title Demonstrate improved FOTO score to equal or greater than 41 by visit #15 to demonstrate improvement in overall condition and self-reported functional ability.    Baseline 32 (09/14/2020); 41 at visit 10(11/01/20);    Time 12    Period Weeks    Status Achieved      PT LONG TERM GOAL #4   Title Patient will demonstrate R knee PROM 0 - 120 to improve her ability to navigate over obstacles in her community without difficulty.    Baseline 0-103 (09/14/2020); 0-107 (11/01/2020);    Time 12    Period Weeks    Status New      PT LONG TERM GOAL #5   Title Patient will ambulate equal or greater than 1000 feet using LRAD on the 6 Minute Walk Test to demonstrate improved community mobility.    Baseline to be tested session 2 as appropriate (09/14/2020); 09/14/2020 769f with RW (09/14/2020): 760 feet with SPC in L UE and supervision (11/01/2020);    Time 12    Period Weeks    Status Partially Met      PT LONG TERM GOAL #6   Title Complete community, work and/or recreational activities without limitation due to current condition.    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 Patient is demonstrating increased success with closed chain knee ROM with step ups from higher step, and deep squat. Patient is able to comply with all cuing for proper technique of therex with progressions, with good motivation and no increased pain throughout session. PT wil continue progression as able.    Personal Factors and Comorbidities Age;Comorbidity 3+;Past/Current Experience;Fitness;Time since onset of injury/illness/exacerbation;Transportation    Comorbidities Anxiety, Arthritis, Bipolar disorder, Depression, Fibromyalgia, Migraines,Had an abdominal hysterectomy, c-section, B hammer-toe repair, gastric bypass (needs revision), hernia repair, hip-surgery (labral repair), B knee surgery to regenerate meniscus, B oophorectomy, B shoulder surgeries for rotator cuff repair in 2012 and 2016, tonsillectomy, and  tubal ligation, obesity.    Examination-Activity Limitations Lift;Stairs;Continence;Bend;Locomotion Level;Bathing;Bed Mobility;Squat;Stand;Caring for Others;Carry;Self Feeding;Dressing;Sleep;Transfers    Examination-Participation Restrictions Yard Work;Cleaning;Laundry;Community Activity;Driving;Occupation;Interpersonal Relationship;Meal Prep;Shop    Stability/Clinical Decision Making Evolving/Moderate complexity    Clinical Decision Making Moderate    Rehab Potential Good    PT Frequency 2x / week    PT Duration 12 weeks    PT Treatment/Interventions ADLs/Self Care Home Management;Moist  Heat;Electrical Stimulation;Therapeutic activities;Functional mobility training;Stair training;Gait training;Therapeutic exercise;Balance training;Neuromuscular re-education;Patient/family education;Manual techniques;Dry needling;Taping;Spinal Manipulations;Joint Manipulations;Cryotherapy;Passive range of motion;Scar mobilization;Energy conservation;Manual lymph drainage    PT Next Visit Plan progressive functional  LE strengthening and ROM    PT Home Exercise Plan Medbridge Access Code: [facility]           Patient will benefit from skilled therapeutic intervention in order to improve the following deficits and impairments:  Abnormal gait,Decreased endurance,Difficulty walking,Obesity,Improper body mechanics,Pain,Postural dysfunction,Decreased strength,Decreased coordination,Decreased activity tolerance,Decreased skin integrity,Decreased scar mobility,Decreased mobility,Prosthetic Dependency,Impaired perceived functional ability,Hypomobility,Decreased range of motion,Decreased balance,Increased edema,Impaired flexibility  Visit Diagnosis: Chronic pain of right knee  Stiffness of right knee, not elsewhere classified  Other idiopathic scoliosis, thoracolumbar region  Abnormal posture     Problem List Patient Active Problem List   Diagnosis Date Noted  . Status post total knee replacement, right 08/22/2020  . Primary osteoarthritis of right knee 08/21/2020  . Leg edema 06/22/2019  . Essential hypertension 06/22/2019  . Fatigue 06/22/2019   Durwin Reges DPT  Durwin Reges 11/25/2020, 10:19 AM  [place] [facility] [address] [place], [address] Phone: [phone]   Fax:  [phone]  Name: Kimberly Herring MRN: [id number] Date of Birth: 11-Jun-1966

## 2020-11-28 ENCOUNTER — Ambulatory Visit: Payer: BC Managed Care – PPO | Admitting: Physical Therapy

## 2020-11-28 ENCOUNTER — Encounter: Payer: Self-pay | Admitting: Physical Therapy

## 2020-11-28 ENCOUNTER — Other Ambulatory Visit: Payer: Self-pay

## 2020-11-28 DIAGNOSIS — M25561 Pain in right knee: Secondary | ICD-10-CM | POA: Diagnosis not present

## 2020-11-28 DIAGNOSIS — G8929 Other chronic pain: Secondary | ICD-10-CM

## 2020-11-28 DIAGNOSIS — M25661 Stiffness of right knee, not elsewhere classified: Secondary | ICD-10-CM

## 2020-11-28 NOTE — Therapy (Signed)
[place] [facility] [address], [place], [address] Phone: [phone]   Fax:  [phone]  Physical Therapy Treatment  Patient Details  Name: Kimberly Herring MRN: [id number] Date of Birth: 1966-01-04 Referring Provider (PT): Naiping M. Erlinda Hong, MD   Encounter Date: 11/28/2020   PT End of Session - 11/28/20 1354    Visit Number 18    Number of Visits 24    Date for PT Re-Evaluation 12/07/20    Authorization Type [employer] reporting period from 11/01/2020    Authorization Time Period 06/16/20 through 08/25/20    Authorization - Visit Number 7    Authorization - Number of Visits 10    Progress Note Due on Visit 20    PT Start Time 0145    PT Stop Time 0230    PT Time Calculation (min) 45 min    Activity Tolerance Patient tolerated treatment well    Behavior During Therapy [facility] for tasks assessed/performed           Past Medical History:  Diagnosis Date  . Anemia   . Anxiety   . Arthritis   . Asthma   . Bipolar 2 disorder ([place])   . Complication of anesthesia    hard to put to sleep  . Depression   . Family history of adverse reaction to anesthesia    Mother is hard to wake up  . Fibromyalgia   . Hypertension   . Hypoglycemia following gastrointestinal surgery    post-bariatric surgery hypoglycemia (followed at [facility])  . Lung nodules    lung nodules ~1999; diagnosed wtih "Legionnaires", reported follow-up imaging showed nodules had resolved but some "scar tissue" present  . Migraine aura, persistent   . 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 (Hematologist Dr. Sterling Big, MD at [facility])  . Pneumonia   . Undifferentiated connective tissue disease [facility])    [facility]    Past Surgical History:  Procedure Laterality Date  . ABDOMINAL HYSTERECTOMY    . CESAREAN SECTION    . ESOPHAGOGASTRODUODENOSCOPY    . FOOT SURGERY Bilateral   . GASTRIC  BYPASS    . HERNIA REPAIR    . HIP SURGERY    . KNEE SURGERY Bilateral   . LAPAROSCOPIC OOPHERECTOMY    . SHOULDER SURGERY Bilateral   . TONSILLECTOMY    . TOTAL KNEE ARTHROPLASTY Right 08/22/2020   Procedure: RIGHT TOTAL KNEE ARTHROPLASTY;  Surgeon: Leandrew Koyanagi, MD;  Location: [place];  Service: Orthopedics;  Laterality: Right;  . TUBAL LIGATION      There were no vitals filed for this visit.   Subjective Assessment - 11/28/20 1350    Subjective Patient reports she had a hard weekend, was very busy working her job and helping her mom. She reports her knee has maintained her good ROM following last session. Her R knee is doing well, continued pain in the L knee    Pertinent History Patient is a 55 y.o. female who presents to outpatient physical therapy with a referral for medical diagnosis s/p R TKA. 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  Relevant past medical history and comorbidities include platelet storage pool disease (doesn't clot quickly enough, carries a medication to take in case she needs it), fibromyalgia, asthma (has rescue inhaler), lung nodules (being followed), connective tissue disease, bipolar 2 with hallucinations, depression (sees mental health therapist weekly), arthritis, anemia, previous pelvic floor PT, hernia repair, hip surgery, bilateral knee surgery, bilateral shoulder surgery, hypertension, leg edema, fatigue. .  Patient denies hx of cancer, stroke, seizures, lung problem (except asthma and lung nodule being watched), major cardiac events (except clotting disorder), diabetes, unexplained weight loss, changes in bowel or bladder problems, new onset  stumbling or dropping things apart from described below.    Limitations Lifting;Standing;Walking;House hold activities    Patient Stated Goals to get better              Ther-Ex   - Nustep seat 9 UE 8 83mns L5 for gentle strengthening of BLE  -TRX squat 3x 10 with light tap to 12in step; L foot on balance stone with cuing for increase RLE push, active knee flex 105d  -RLE step up onto 8in step x10; 12in  with difficulty with eccentric lower L heel to ground  OMEGA leg press 65# 3 x6 with min cuing for eccentric control with good carry over  -OMEGA knee flexRLE ONLY 25# x10; 35# x5 with min cuing for full available flex with good carry over  Sidestepping 270fx2 L and R with min cuing for increased foot clearance with good carry over     Manual Seated tib/fib inferior distraction with passive flexion x12   Flex P/AROM following:132/127                         PT Education - 11/28/20 1353    Education Details therex form/technique    Person(s) Educated Patient    Methods Explanation;Demonstration;Verbal cues    Comprehension Verbalized understanding;Returned demonstration;Verbal cues required            PT Short Term Goals - 09/14/20 1245      PT SHORT TERM GOAL #1   Title Be independent with initial home exercise program for self-management of symptoms.    Baseline initial HEP provided at IE (09/14/2020);    Time 3    Period Weeks    Status New    Target Date 10/05/20             PT Long Term Goals - 11/01/20 1847      PT LONG TERM GOAL #1   Title Be independent with a long-term home exercise program for self-management of symptoms.    Baseline initial HEP provided at IE (09/14/2020); currently participating in appropriate HEP (11/01/2020);    Time 12    Period Weeks    Status Partially Met   TARGET DATE FOR ALL LONG TERM GOALS: 12/07/2020   Target Date --      PT LONG TERM GOAL #2   Title Patient will complete 5  Time Sit To Stand Test in equal or less than 12 seconds from 18.5 inch plinth with no UE support to improve ability to move around at home and at work while carrying items.    Baseline 13 seconds from 18.5 inch plinth to RW with B UE support on plinth (09/14/2020);  9.44 seconds from 18.5 inch plinth with no UE use. Did offload L LE some (11/01/2020);    Time 12    Period Weeks    Status Achieved  PT LONG TERM GOAL #3   Title Demonstrate improved FOTO score to equal or greater than 41 by visit #15 to demonstrate improvement in overall condition and self-reported functional ability.    Baseline 32 (09/14/2020); 41 at visit 10(11/01/20);    Time 12    Period Weeks    Status Achieved      PT LONG TERM GOAL #4   Title Patient will demonstrate R knee PROM 0 - 120 to improve her ability to navigate over obstacles in her community without difficulty.    Baseline 0-103 (09/14/2020); 0-107 (11/01/2020);    Time 12    Period Weeks    Status New      PT LONG TERM GOAL #5   Title Patient will ambulate equal or greater than 1000 feet using LRAD on the 6 Minute Walk Test to demonstrate improved community mobility.    Baseline to be tested session 2 as appropriate (09/14/2020); 09/14/2020 724f with RW (09/14/2020): 760 feet with SPC in L UE and supervision (11/01/2020);    Time 12    Period Weeks    Status Partially Met      PT LONG TERM GOAL #6   Title Complete community, work and/or recreational activities without limitation due to current condition.    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 Patient has demonstrated good ability to maintain knee flex with continued flex >130d. Patient is able to demonstrate good carry over of all cuing for proper technique of therex with good motivation throughout session without increased pain. PT will continue progression as able.    Personal Factors and Comorbidities Age;Comorbidity 3+;Past/Current Experience;Fitness;Time since onset of injury/illness/exacerbation;Transportation    Comorbidities Anxiety, Arthritis, Bipolar disorder, Depression, Fibromyalgia, Migraines,Had an abdominal hysterectomy, c-section, B hammer-toe repair, gastric bypass (needs revision), hernia repair, hip-surgery (labral repair), B knee surgery to regenerate meniscus, B oophorectomy, B shoulder surgeries for rotator cuff repair in 2012 and 2016, tonsillectomy, and  tubal ligation, obesity.    Examination-Activity Limitations Lift;Stairs;Continence;Bend;Locomotion Level;Bathing;Bed Mobility;Squat;Stand;Caring for Others;Carry;Self Feeding;Dressing;Sleep;Transfers    Examination-Participation Restrictions Yard Work;Cleaning;Laundry;Community Activity;Driving;Occupation;Interpersonal Relationship;Meal Prep;Shop    Stability/Clinical Decision Making Evolving/Moderate complexity    Clinical Decision Making Moderate    Rehab Potential Good    PT Frequency 2x / week    PT Duration 12 weeks    PT Treatment/Interventions ADLs/Self Care Home Management;Moist Heat;Electrical Stimulation;Therapeutic activities;Functional mobility training;Stair training;Gait training;Therapeutic exercise;Balance training;Neuromuscular re-education;Patient/family education;Manual techniques;Dry needling;Taping;Spinal Manipulations;Joint Manipulations;Cryotherapy;Passive range of  motion;Scar mobilization;Energy conservation;Manual lymph drainage    PT Next Visit Plan progressive functional LE strengthening and ROM    PT Home Exercise Plan Medbridge Access Code: [email or web address]   Consulted and Agree with Plan of Care Patient           Patient will benefit from skilled therapeutic intervention in order to improve the following  deficits and impairments:  Abnormal gait,Decreased endurance,Difficulty walking,Obesity,Improper body mechanics,Pain,Postural dysfunction,Decreased strength,Decreased coordination,Decreased activity tolerance,Decreased skin integrity,Decreased scar mobility,Decreased mobility,Prosthetic Dependency,Impaired perceived functional ability,Hypomobility,Decreased range of motion,Decreased balance,Increased edema,Impaired flexibility  Visit Diagnosis: Chronic pain of right knee  Stiffness of right knee, not elsewhere classified     Problem List Patient Active Problem List   Diagnosis Date Noted  . Status post total knee replacement, right 08/22/2020  . Primary osteoarthritis of right knee 08/21/2020  . Leg edema 06/22/2019  . Essential hypertension 06/22/2019  . Fatigue 06/22/2019   Durwin Reges DPT Durwin Reges 11/28/2020, 3:56 PM  [place] [facility] [address], [place], [address] Phone: [phone]   Fax:  [phone]  Name: Kimberly Herring MRN: [id number] Date of Birth: 1966-05-28

## 2020-12-02 ENCOUNTER — Other Ambulatory Visit: Payer: Self-pay

## 2020-12-02 ENCOUNTER — Ambulatory Visit: Payer: BC Managed Care – PPO | Admitting: Physical Therapy

## 2020-12-02 ENCOUNTER — Encounter: Payer: Self-pay | Admitting: Physical Therapy

## 2020-12-02 DIAGNOSIS — M25561 Pain in right knee: Secondary | ICD-10-CM

## 2020-12-02 DIAGNOSIS — G8929 Other chronic pain: Secondary | ICD-10-CM

## 2020-12-02 DIAGNOSIS — M25661 Stiffness of right knee, not elsewhere classified: Secondary | ICD-10-CM

## 2020-12-02 NOTE — Therapy (Signed)
[place] [facility] [address], [place], [address] Phone: [phone]   Fax:  [phone]  Physical Therapy Treatment/Discharge Summary  Patient Details  Name: Kimberly Herring MRN: [id number] Date of Birth: 07-30-1966 Referring Provider (PT): Naiping M. Erlinda Hong, MD   Encounter Date: 12/02/2020   PT End of Session - 12/02/20 0901    Visit Number 19    Number of Visits 24    Date for PT Re-Evaluation 12/07/20    Authorization Type [employer] reporting period from 11/01/2020    Authorization Time Period 06/16/20 through 08/25/20    Authorization - Visit Number 8    Authorization - Number of Visits 10    Progress Note Due on Visit 20    PT Start Time 0845    PT Stop Time 0930    PT Time Calculation (min) 45 min    Activity Tolerance Patient tolerated treatment well    Behavior During Therapy [facility] for tasks assessed/performed           Past Medical History:  Diagnosis Date  . Anemia   . Anxiety   . Arthritis   . Asthma   . Bipolar 2 disorder ([place])   . Complication of anesthesia    hard to put to sleep  . Depression   . Family history of adverse reaction to anesthesia    Mother is hard to wake up  . Fibromyalgia   . Hypertension   . Hypoglycemia following gastrointestinal surgery    post-bariatric surgery hypoglycemia (followed at [facility])  . Lung nodules    lung nodules ~1999; diagnosed wtih "Legionnaires", reported follow-up imaging showed nodules had resolved but some "scar tissue" present  . Migraine aura, persistent   . 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 (Hematologist Dr. Sterling Big, MD at [facility])  . Pneumonia   . Undifferentiated connective tissue disease [facility])    [facility]    Past Surgical History:  Procedure Laterality Date  . ABDOMINAL HYSTERECTOMY    . CESAREAN SECTION    . ESOPHAGOGASTRODUODENOSCOPY    . FOOT SURGERY  Bilateral   . GASTRIC BYPASS    . HERNIA REPAIR    . HIP SURGERY    . KNEE SURGERY Bilateral   . LAPAROSCOPIC OOPHERECTOMY    . SHOULDER SURGERY Bilateral   . TONSILLECTOMY    . TOTAL KNEE ARTHROPLASTY Right 08/22/2020   Procedure: RIGHT TOTAL KNEE ARTHROPLASTY;  Surgeon: Leandrew Koyanagi, MD;  Location: [place];  Service: Orthopedics;  Laterality: Right;  . TUBAL LIGATION      There were no vitals filed for this visit.   Subjective Assessment - 12/02/20 0859    Subjective Reports her R knee is doing very well. Is having an injection scheduled for L knee, as this is becoming more limiting than R.    Pertinent History Patient is a 55 y.o. female who presents to outpatient physical therapy with a referral for medical diagnosis s/p R TKA. 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   Relevant past medical history and comorbidities include platelet storage pool disease (doesn't clot quickly enough,  carries a medication to take in case she needs it), fibromyalgia, asthma (has rescue inhaler), lung nodules (being followed), connective tissue disease, bipolar 2 with hallucinations, depression (sees mental health therapist weekly), arthritis, anemia, previous pelvic floor PT, hernia repair, hip surgery, bilateral knee surgery, bilateral shoulder surgery, hypertension, leg edema, fatigue. .  Patient denies hx of cancer, stroke, seizures, lung problem (except asthma and lung nodule being watched), major cardiac events (except clotting disorder), diabetes, unexplained weight loss, changes in bowel or bladder problems, new onset stumbling or dropping things apart from described below.    Limitations  Lifting;Standing;Walking;House hold activities    Patient Stated Goals to get better    Pain Onset More than a month ago             Ther-Ex  - Nustep seat 9 UE 8 14mns L5 for gentle strengthening of BLE  PT reviewed the following HEP with patient with patient able to demonstrate a set of the following with min cuing for correction needed. PT educated patient on parameters of therex (how/when to inc/decrease intensity, frequency, rep/set range, stretch hold time, and purpose of therex) with verbalized understanding.  Access Code: [email or web address] Hip Abduction with Resistance at Ankles and Counter Support - 1 x daily - 2-3 x weekly - 3 sets - 10 reps Single-Leg Quarter Squat - 1 x daily - 2-3 x weekly - 3 sets - 10 reps Seated Hamstring Curl with Anchored Resistance - 1 x daily - 2-3 x weekly - 3 sets - 10 reps Seated Knee Extension with Resistance - 1 x daily - 2-3 x weekly - 3 sets - 10 reps Side Stepping with Resistance at Ankles - 1 x daily - 2-3 x weekly - 3 sets - 10 reps Standing Single Leg Stance with Counter Support - 1 x daily - 7 x weekly - 30sec hold Standing Gastroc Stretch - 1-2 x daily - 7 x weekly - 30sec hold                PT Education - 12/02/20 0901    Education Details therex form/technique    Person(s) Educated Patient    Methods Explanation;Demonstration;Verbal cues    Comprehension Verbalized understanding;Returned demonstration;Verbal cues required            PT Short Term Goals - 09/14/20 1245      PT SHORT TERM GOAL #1   Title Be independent with initial home exercise program for self-management of symptoms.    Baseline initial HEP provided at IE (09/14/2020);    Time 3    Period Weeks    Status New    Target Date 10/05/20             PT Long Term Goals - 12/02/20 0854      PT LONG TERM GOAL #1   Title Be independent with a long-term home exercise program for self-management of symptoms.    Baseline initial HEP  provided at IE (09/14/2020); currently participating in appropriate HEP (11/01/2020);    Time 12    Period Weeks    Status Achieved      PT LONG TERM GOAL #2   Title Patient will complete 5 Time Sit To Stand Test in equal or less than 12 seconds from 18.5 inch plinth with no UE support to improve ability to move around at home and at work while carrying items.    Baseline 13 seconds from 18.5 inch plinth to RW with B UE support on plinth (  09/14/2020);  9.44 seconds from 18.5 inch plinth with no UE use. Did offload L LE some (11/01/2020);    Time 12    Period Weeks    Status Achieved      PT LONG TERM GOAL #3   Title Demonstrate improved FOTO score to equal or greater than 41 by visit #15 to demonstrate improvement in overall condition and self-reported functional ability.    Baseline 32 (09/14/2020); 41 at visit 10(11/01/20); 12/03/19 60    Time 12    Period Weeks    Status Achieved      PT LONG TERM GOAL #4   Title Patient will demonstrate R knee PROM 0 - 120 to improve her ability to navigate over obstacles in her community without difficulty.    Baseline 0-103 (09/14/2020); 0-107 (11/01/2020); 12/02/20 0-132d    Time 12    Period Weeks    Status Achieved      PT LONG TERM GOAL #5   Title Patient will ambulate equal or greater than 1000 feet using LRAD on the 6 Minute Walk Test to demonstrate improved community mobility.    Baseline to be tested session 2 as appropriate (09/14/2020); 09/14/2020 738f with RW (09/14/2020): 760 feet with SPC in L UE and supervision (11/01/2020); 10519fwith SPC in RUE (12/03/19)    Time 12    Period Weeks    Status Achieved      PT LONG TERM GOAL #6   Title Complete community, work and/or recreational activities without limitation due to current condition.    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 Patient has demonstrated normalized ROM, increased walking distance (>30032fain since last reassessment), and increased preceived function through FOTO assessment. PT reviewed HEP for maintenance of gains made through PT with demonstrated understanding. PT advised patient to utilize cane in RUE to offset L knee pain in lieu of pending TKA. Pt given clinic contact info should further questions/concerns arise. Pt to d/c PT.    Personal Factors and Comorbidities Age;Comorbidity 3+;Past/Current Experience;Fitness;Time since onset of injury/illness/exacerbation;Transportation    Comorbidities Anxiety, Arthritis, Bipolar disorder, Depression, Fibromyalgia, Migraines,Had an abdominal hysterectomy, c-section, B hammer-toe repair, gastric bypass (needs revision), hernia repair, hip-surgery (labral repair), B knee surgery to regenerate meniscus, B oophorectomy, B shoulder surgeries for rotator cuff repair in 2012 and 2016, tonsillectomy, and  tubal ligation, obesity.    Examination-Activity Limitations Lift;Stairs;Continence;Bend;Locomotion Level;Bathing;Bed Mobility;Squat;Stand;Caring for Others;Carry;Self Feeding;Dressing;Sleep;Transfers    Examination-Participation Restrictions Yard Work;Cleaning;Laundry;Community Activity;Driving;Occupation;Interpersonal Relationship;Meal Prep;Shop    Stability/Clinical Decision Making Evolving/Moderate complexity    Clinical Decision Making Moderate    Rehab  Potential Good    PT Frequency 2x / week    PT Duration 12 weeks    PT Treatment/Interventions ADLs/Self Care Home Management;Moist  Heat;Electrical Stimulation;Therapeutic activities;Functional mobility training;Stair training;Gait training;Therapeutic exercise;Balance training;Neuromuscular re-education;Patient/family education;Manual techniques;Dry needling;Taping;Spinal Manipulations;Joint Manipulations;Cryotherapy;Passive range of motion;Scar mobilization;Energy conservation;Manual lymph drainage    PT Next Visit Plan progressive functional LE strengthening and ROM    PT Home Exercise Plan Medbridge Access Code: [email or web address]    Consulted and Agree with Plan of Care Patient           Patient will benefit from skilled therapeutic intervention in order to improve the following deficits and impairments:  Abnormal gait,Decreased endurance,Difficulty walking,Obesity,Improper body mechanics,Pain,Postural dysfunction,Decreased strength,Decreased coordination,Decreased activity tolerance,Decreased skin integrity,Decreased scar mobility,Decreased mobility,Prosthetic Dependency,Impaired perceived functional ability,Hypomobility,Decreased range of motion,Decreased balance,Increased edema,Impaired flexibility  Visit Diagnosis: Chronic pain of right knee  Stiffness of right knee, not elsewhere classified     Problem List Patient Active Problem List   Diagnosis Date Noted  . Status post total knee replacement, right 08/22/2020  . Primary osteoarthritis of right knee 08/21/2020  . Leg edema 06/22/2019  . Essential hypertension 06/22/2019  . Fatigue 06/22/2019   Durwin Reges DPT Durwin Reges 12/02/2020, 9:30 AM  [place] [facility] [address] [place], [address] Phone: [phone]   Fax:  [phone]  Name: Kimberly Herring MRN: [id number] Date of Birth: 08-26-1966

## 2020-12-05 ENCOUNTER — Ambulatory Visit: Payer: BC Managed Care – PPO | Admitting: Physical Therapy

## 2020-12-08 ENCOUNTER — Other Ambulatory Visit: Payer: Self-pay

## 2020-12-08 ENCOUNTER — Ambulatory Visit (HOSPITAL_COMMUNITY)
Admission: RE | Admit: 2020-12-08 | Discharge: 2020-12-08 | Disposition: A | Discharge status: Home / Self Care | Payer: BC Managed Care – PPO | Source: Ambulatory Visit | Attending: Orthopaedic Surgery | Admitting: Orthopaedic Surgery

## 2020-12-08 DIAGNOSIS — Z96651 Presence of right artificial knee joint: Secondary | ICD-10-CM | POA: Diagnosis not present

## 2020-12-09 ENCOUNTER — Encounter: Payer: BC Managed Care – PPO | Admitting: Physical Therapy

## 2020-12-12 ENCOUNTER — Encounter: Payer: BC Managed Care – PPO | Admitting: Physical Therapy

## 2020-12-14 IMAGING — CR DG CHEST 2V
2 series · 2 of 2 positions shown · non-contrast
Comparison: 03/10/2019

CLINICAL DATA: Preoperative

EXAM:
CHEST - 2 VIEW

[w chest pa]
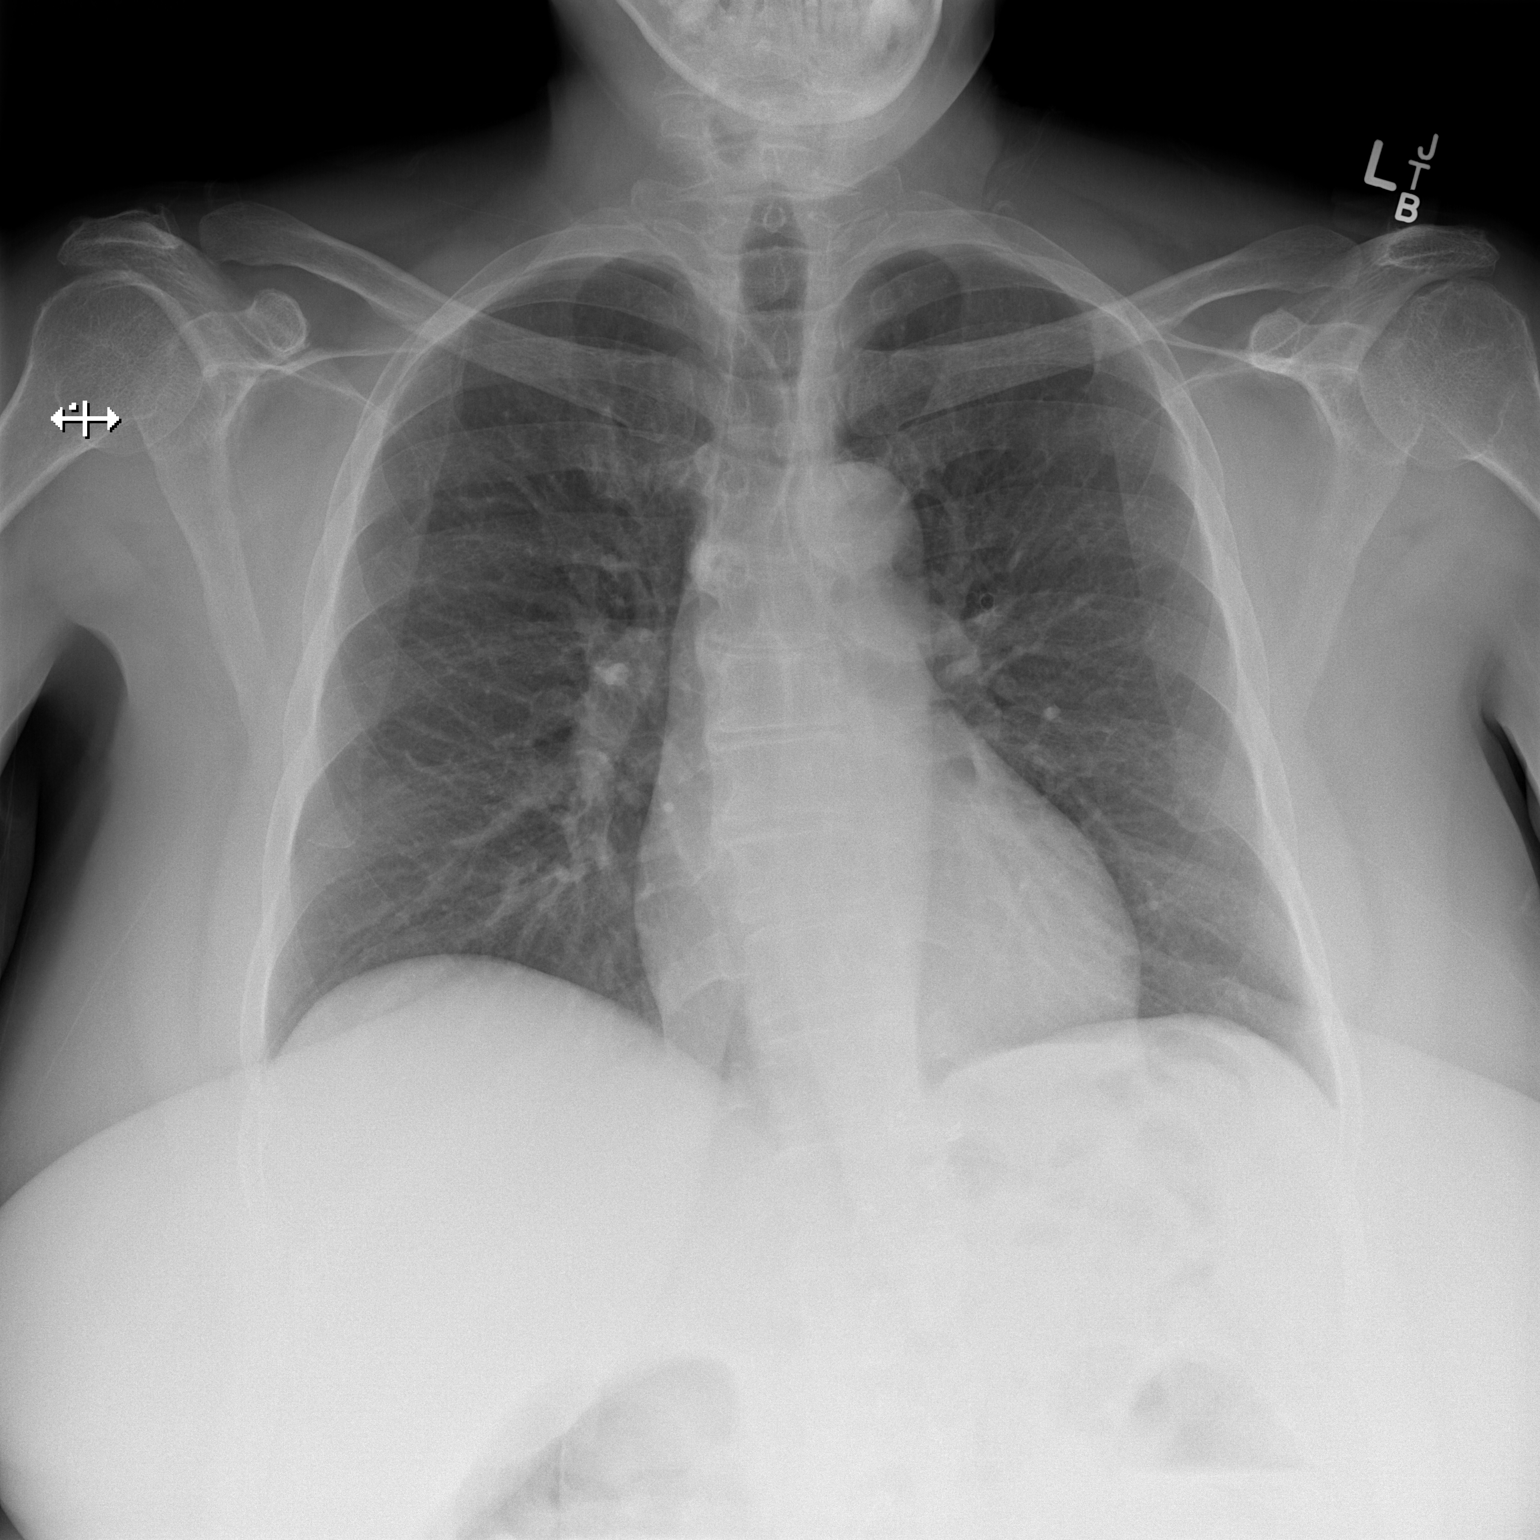

[w chest lat]
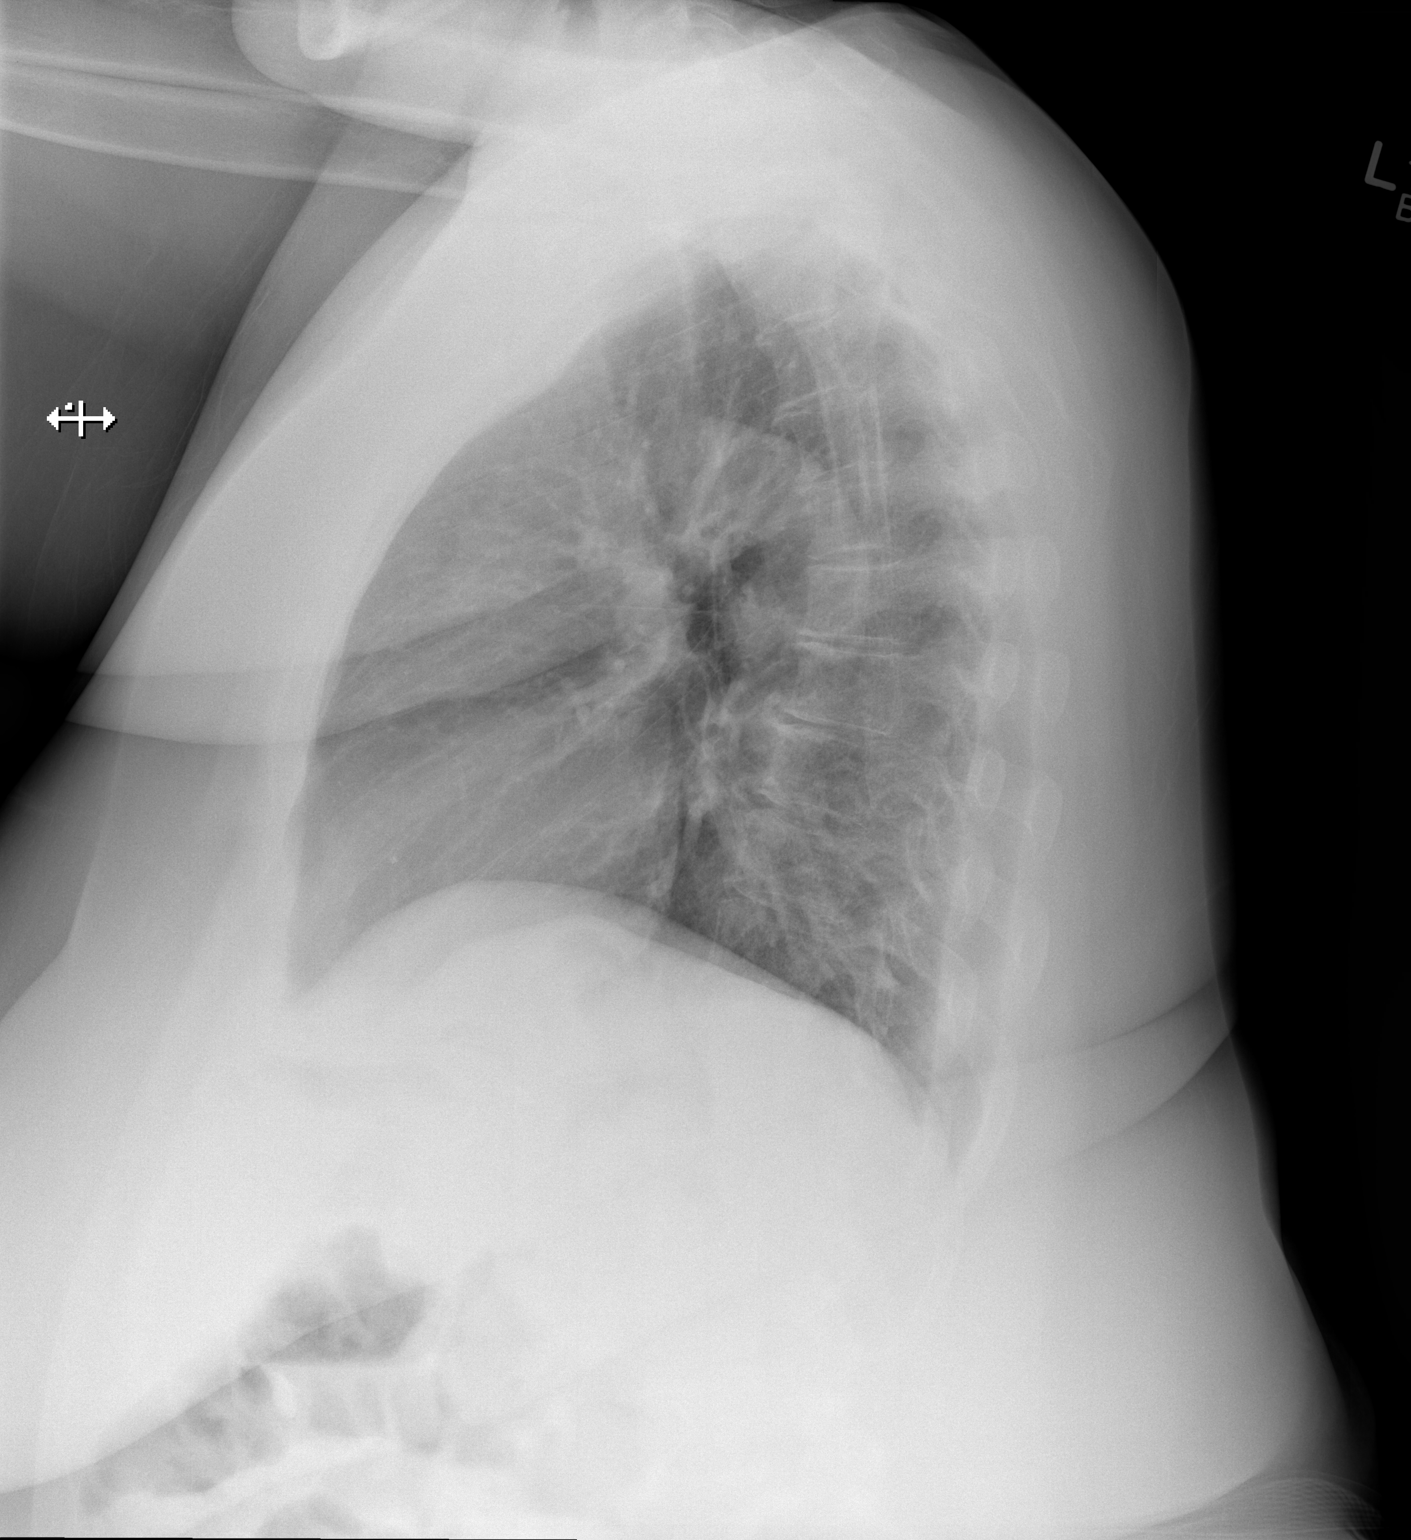

[2 of 2 positions shown; findings below may reference images not displayed]

FINDINGS: The heart size and mediastinal contours are within normal limits.
Both lungs are clear. The visualized skeletal structures are
unremarkable.
IMPRESSION: No acute abnormality of the lungs.

## 2020-12-16 ENCOUNTER — Encounter: Payer: BC Managed Care – PPO | Admitting: Physical Therapy

## 2020-12-16 ENCOUNTER — Telehealth: Payer: Self-pay | Admitting: Orthopaedic Surgery

## 2020-12-16 NOTE — Telephone Encounter (Signed)
Received $25.00 cash and medical records release form/Forwarding to [employer] today 

## 2020-12-19 ENCOUNTER — Encounter: Payer: BC Managed Care – PPO | Admitting: Physical Therapy

## 2020-12-23 ENCOUNTER — Encounter: Payer: BC Managed Care – PPO | Admitting: Physical Therapy

## 2020-12-25 IMAGING — DX DG KNEE 1-2V PORT*R*
1 series · 4 of 4 positions shown · non-contrast
Comparison: Radiographs 07/27/2020

CLINICAL DATA: Post right total knee replacement

EXAM:
PORTABLE RIGHT KNEE - 1-2 VIEW

[Series 1: knee · 0.14mm/px · 4 of 4 slices shown]
[im 1/4]
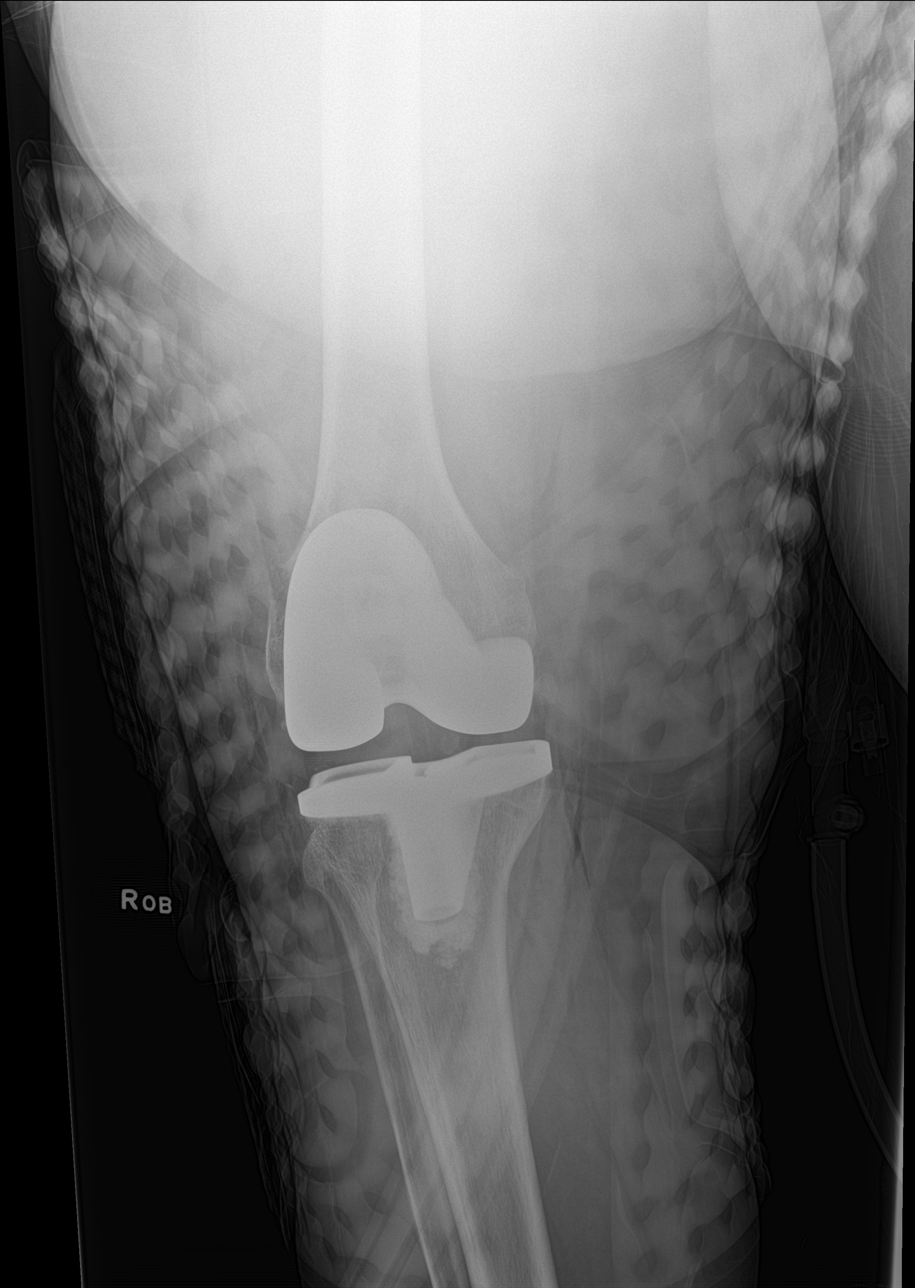
[im 2/4]
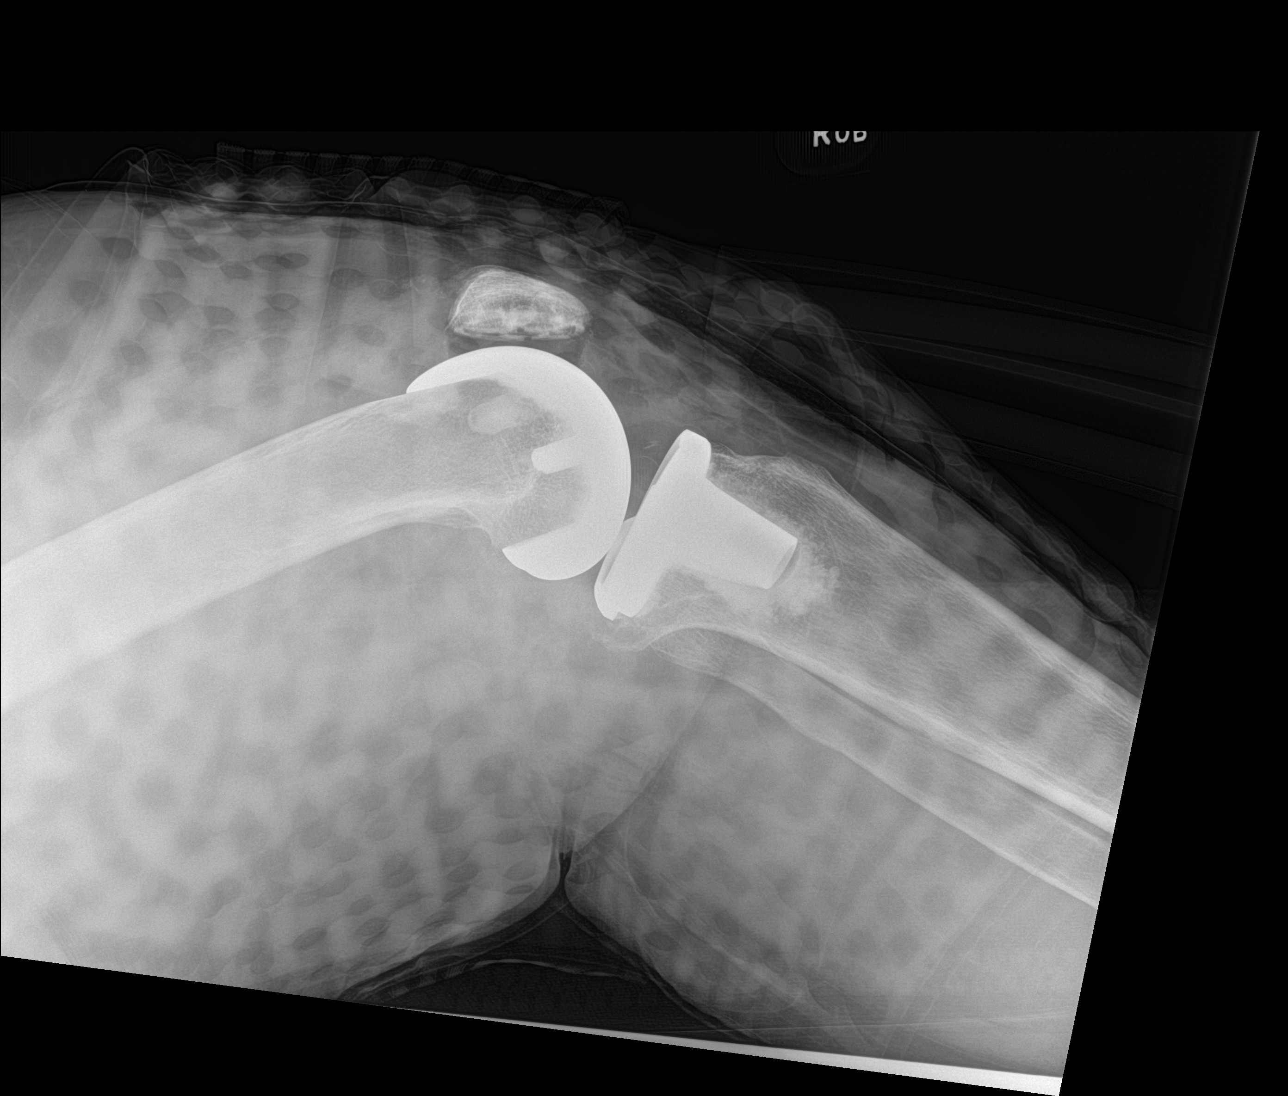
[im 3/4]
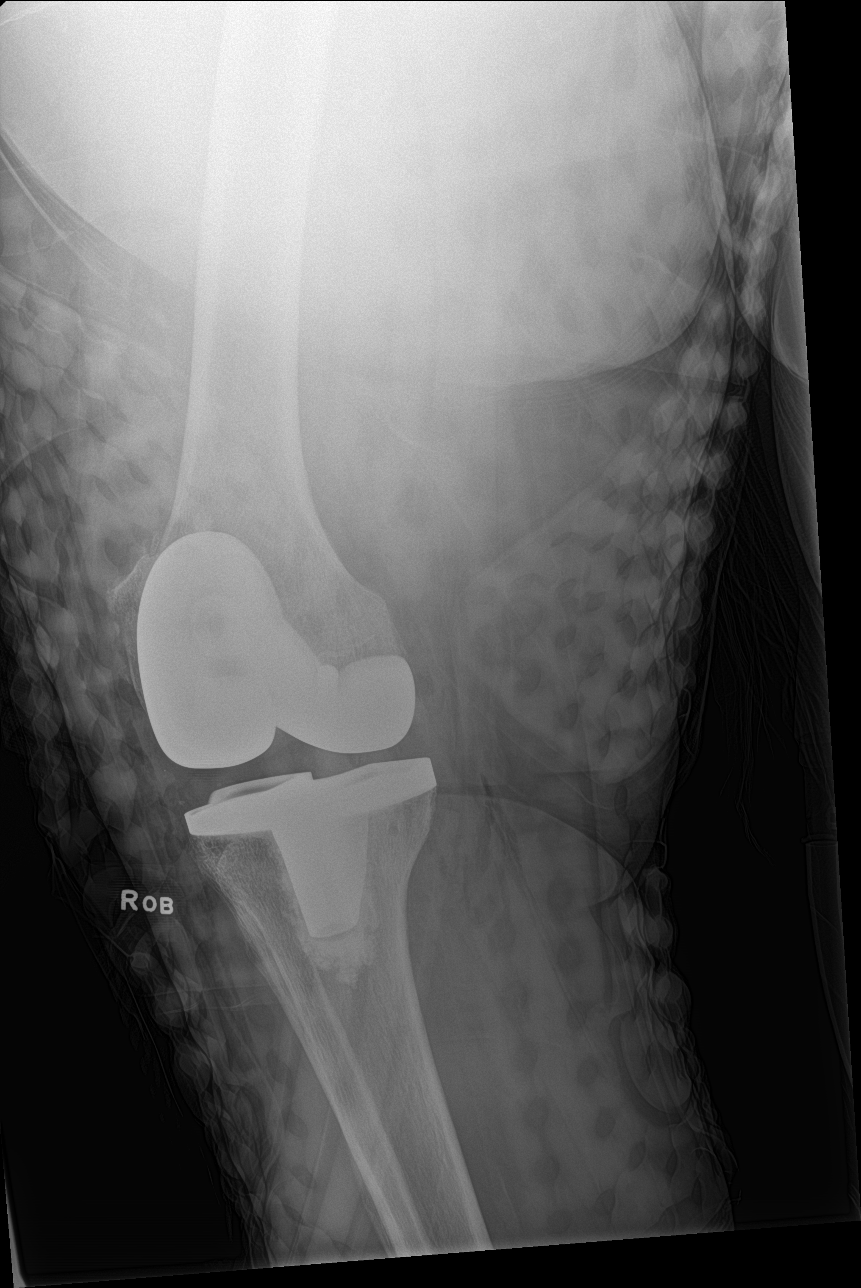
[im 4/4]
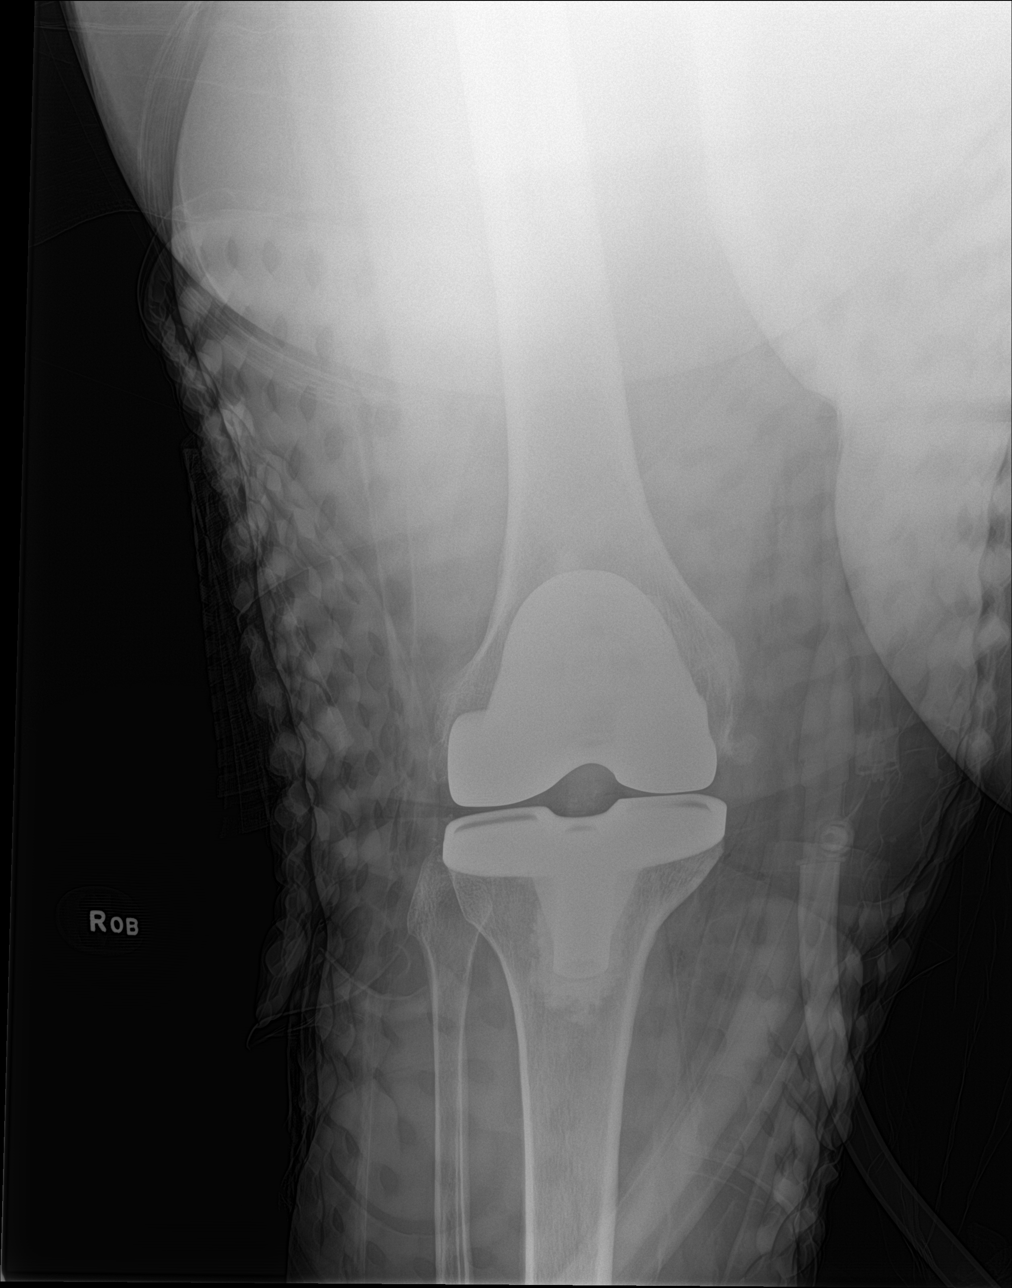

[4 of 4 positions shown; findings below may reference images not displayed]

FINDINGS: Overlying dressings may obscure fine bone and soft-tissue detail.
Patient is post right total knee arthroplasty with posterior
patellar resurfacing. Hardware is in expected alignment without
acute complication. Expected postsurgical soft tissue changes
including soft tissue gas are present. Few punctate joint bodies
along the anterior joint line, can be seen in the postoperative
state.
IMPRESSION: Status post right total knee arthroplasty without acute
complication.

## 2020-12-28 ENCOUNTER — Ambulatory Visit: Payer: Self-pay

## 2020-12-28 ENCOUNTER — Encounter: Payer: Self-pay | Admitting: Orthopaedic Surgery

## 2020-12-28 ENCOUNTER — Other Ambulatory Visit: Payer: Self-pay

## 2020-12-28 ENCOUNTER — Ambulatory Visit (INDEPENDENT_AMBULATORY_CARE_PROVIDER_SITE_OTHER): Payer: BC Managed Care – PPO | Admitting: Orthopaedic Surgery

## 2020-12-28 DIAGNOSIS — M1712 Unilateral primary osteoarthritis, left knee: Secondary | ICD-10-CM

## 2020-12-28 MED ORDER — BUPIVACAINE HCL 0.25 % IJ SOLN
2.0000 mL | INTRAMUSCULAR | Status: AC | PRN
  Administered 2020-12-28: 2 mL via INTRA_ARTICULAR

## 2020-12-28 MED ORDER — METHYLPREDNISOLONE ACETATE 40 MG/ML IJ SUSP
40.0000 mg | INTRAMUSCULAR | Status: AC | PRN
  Administered 2020-12-28: 40 mg via INTRA_ARTICULAR

## 2020-12-28 MED ORDER — LIDOCAINE HCL 1 % IJ SOLN
2.0000 mL | INTRAMUSCULAR | Status: AC | PRN
  Administered 2020-12-28: 2 mL

## 2020-12-28 NOTE — Progress Notes (Signed)
Office Visit Note   Patient: Kimberly Herring           Date of Birth: 08/02/1966           MRN: [id number] Visit Date: 12/28/2020              Requested by: Verdie Shire, MD [address],  [place] [id number] PCP: Verdie Shire, MD   Assessment & Plan: Visit Diagnoses:  1. Primary osteoarthritis of left knee     Plan: Impression is left knee advanced degenerative joint disease.  We have discussed treatment options to include cortisone injection for which she would like to proceed.  We have also discussed total knee replacement in the future.  She will follow up with [place] at her regularly scheduled appointment for her right total knee replacement.  Follow-Up Instructions: Return if symptoms worsen or fail to improve, for left knee.  f/u at already scheduled appt for right tkr.   Orders:  Orders Placed This Encounter  Procedures  . Large Joint Inj: L knee  . XR KNEE 3 VIEW LEFT   No orders of the defined types were placed in this encounter.     Procedures: Large Joint Inj: L knee on 12/28/2020 10:42 AM Indications: pain Details: 22 G needle, anterolateral approach Medications: 2 mL lidocaine 1 %; 2 mL bupivacaine 0.25 %; 40 mg methylPREDNISolone acetate 40 MG/ML      Clinical Data: No additional findings.   Subjective: Chief Complaint  Patient presents with  . Left Knee - Pain    HPI patient is a pleasant 55 year old female who comes in today with left knee pain for the past decade or so.  She has been dealing with this for a while and has progressively worsened.  The majority of her pain is to the medial aspect.  She describes this as a constant tooth ache with occasional pain at night.  Going up and down hills seems to aggravate her symptoms.  She denies any mechanical symptoms.  She has been taking Tylenol daily and is on a pain patch for which she gets from pain management.  She has had a previous cortisone injection  to the left knee years ago which she remembers helping.  Review of Systems as detailed in HPI.  All others reviewed and are negative.   Objective: Vital Signs: There were no vitals taken for this visit.  Physical Exam well-developed well-nourished female no acute distress.  Alert oriented x3.  Ortho Exam left knee exam shows a small effusion.  Range of motion 0 to 100 degrees.  Medial joint line tenderness.  Marked patellofemoral crepitus.  Ligaments are stable.  She is neurovascular intact distally.  Specialty Comments:  No specialty comments available.  Imaging: XR KNEE 3 VIEW LEFT  Result Date: 12/28/2020 X-rays demonstrate advanced degenerative changes medial and patellofemoral compartments with tricompartmental osteophytes    PMFS History: Patient Active Problem List   Diagnosis Date Noted  . Status post total knee replacement, right 08/22/2020  . Primary osteoarthritis of right knee 08/21/2020  . Leg edema 06/22/2019  . Essential hypertension 06/22/2019  . Fatigue 06/22/2019   Past Medical History:  Diagnosis Date  . Anemia   . Anxiety   . Arthritis   . Asthma   . Bipolar 2 disorder ([place])   . Complication of anesthesia    hard to put to sleep  . Depression   . Family history of adverse reaction to anesthesia  Mother is hard to wake up  . Fibromyalgia   . Hypertension   . Hypoglycemia following gastrointestinal surgery    post-bariatric surgery hypoglycemia (followed at [facility])  . Lung nodules    lung nodules ~1999; diagnosed wtih "Legionnaires", reported follow-up imaging showed nodules had resolved but some "scar tissue" present  . Migraine aura, persistent   . Platelet storage pool disease [facility]    with history of abnormal post-operative bleeding. (Hematologist Dr. Sterling Big, [facility] at [facility])  . Pneumonia   . Undifferentiated connective tissue disease ([place])    [facility]    No family history on file.  Past Surgical History:  Procedure Laterality  Date  . ABDOMINAL HYSTERECTOMY    . CESAREAN SECTION    . ESOPHAGOGASTRODUODENOSCOPY    . FOOT SURGERY Bilateral   . GASTRIC BYPASS    . HERNIA REPAIR    . HIP SURGERY    . KNEE SURGERY Bilateral   . LAPAROSCOPIC OOPHERECTOMY    . SHOULDER SURGERY Bilateral   . TONSILLECTOMY    . TOTAL KNEE ARTHROPLASTY Right 08/22/2020   Procedure: RIGHT TOTAL KNEE ARTHROPLASTY;  Surgeon: Leandrew Koyanagi, MD;  Location: [place];  Service: Orthopedics;  Laterality: Right;  . TUBAL LIGATION     Social History   Occupational History  . Not on file  Tobacco Use  . Smoking status: Never Smoker  . Smokeless tobacco: Never Used  Vaping Use  . Vaping Use: Never used  Substance and Sexual Activity  . Alcohol use: Yes    Comment: socially  . Drug use: No  . Sexual activity: Not on file

## 2021-01-12 ENCOUNTER — Encounter: Payer: Self-pay | Admitting: Orthopaedic Surgery

## 2021-01-13 NOTE — Telephone Encounter (Signed)
Probably best to have her come in for reevaluation.  Difficult to say what is going on over my chart.

## 2021-01-13 NOTE — Telephone Encounter (Signed)
Do you know anything about this ultrasound she is referring to

## 2021-01-13 NOTE — Telephone Encounter (Signed)
Looks like it was negative.  Can you let her know please.  Thanks.

## 2021-02-14 ENCOUNTER — Ambulatory Visit (INDEPENDENT_AMBULATORY_CARE_PROVIDER_SITE_OTHER): Payer: BC Managed Care – PPO | Admitting: Orthopaedic Surgery

## 2021-02-14 ENCOUNTER — Ambulatory Visit: Payer: Self-pay

## 2021-02-14 ENCOUNTER — Telehealth: Payer: Self-pay

## 2021-02-14 DIAGNOSIS — Z96651 Presence of right artificial knee joint: Secondary | ICD-10-CM | POA: Diagnosis not present

## 2021-02-14 DIAGNOSIS — M1712 Unilateral primary osteoarthritis, left knee: Secondary | ICD-10-CM

## 2021-02-14 NOTE — Telephone Encounter (Signed)
Please precert for left knee visco. This is Dr.Xu's patient. Thanks!  

## 2021-02-14 NOTE — Progress Notes (Signed)
Office Visit Note   Patient: Kimberly Herring           Date of Birth: 06-26-1966           MRN: [id number] Visit Date: 02/14/2021              Requested by: Verdie Shire, MD [address],  [place] [id number] PCP: Verdie Shire, MD   Assessment & Plan: Visit Diagnoses:  1. History of total knee replacement, right   2. Unilateral primary osteoarthritis, left knee     Plan: Impression is 23-month status post right total knee replacement with probable subsequent gastroc strain.  #2, advanced degenerative joint disease left knee.  In regards to her right knee, she is doing well.  Continue with range of motion activities.  In regards to the probable calf strain, this should improve with time.  In regards to the left knee, she would like to get approval for viscosupplementation injection.  Follow-up with Kimberly Herring once approved.  This patient is diagnosed with osteoarthritis of the knee(s).    Radiographs show evidence of joint space narrowing, osteophytes, subchondral sclerosis and/or subchondral cysts.  This patient has knee pain which interferes with functional and activities of daily living.    This patient has experienced inadequate response, adverse effects and/or intolerance with conservative treatments such as acetaminophen, NSAIDS, topical creams, physical therapy or regular exercise, knee bracing and/or weight loss.   This patient has experienced inadequate response or has a contraindication to intra articular steroid injections for at least 3 months.   This patient is not scheduled to have a total knee replacement within 6 months of starting treatment with viscosupplementation.   Follow-Up Instructions: Return in about 6 months (around 08/17/2021) for 6 months for right knee f/u.  once approved for left knee visco inj..   Orders:  Orders Placed This Encounter  Procedures  . XR Knee 1-2 Views Right   No orders of the defined types  were placed in this encounter.     Procedures: No procedures performed   Clinical Data: No additional findings.   Subjective: Chief Complaint  Patient presents with  . Right Knee - Follow-up    HPI patient is a 55 year old female who comes in today 6 months out right total knee replacement.  She has been doing well in regards to the right knee.  She still notes pain to the right lateral calf with certain motions of her foot and ankle.  This began 2 weeks postop following a fall where her right foot slipped backwards.  Right lower extremity ultrasound ordered which was negative for DVT.  Her pain has minimally improved.  Other issue she brings up today is her left knee.  History of advanced degenerative joint disease.  She was seen by Kimberly Herring on 12/28/2020 for this where left knee cortisone injection was performed.  She notes minimal relief following the injection.  She has had a viscosupplementation injection in the past without significant relief.  Review of Systems as detailed in HPI.  All others reviewed and are negative.   Objective: Vital Signs: There were no vitals taken for this visit.  Physical Exam well-developed and well-nourished female in no acute distress.  Alert and oriented x3.  Ortho Exam her right knee exam shows a fully healed surgical scar.  Range of motion 0 to 110 degrees.  Stable valgus varus stress.  She does have mild tenderness to the right lateral calf and at  the gastroc insertion.  No pain with dorsiflexion of the ankle.  She is neurovascular intact distally.  Specialty Comments:  No specialty comments available.  Imaging: XR Knee 1-2 Views Right  Result Date: 02/14/2021 Well-seated prosthesis without complication    PMFS History: Patient Active Problem List   Diagnosis Date Noted  . Status post total knee replacement, right 08/22/2020  . Primary osteoarthritis of right knee 08/21/2020  . Leg edema 06/22/2019  . Essential hypertension 06/22/2019  .  Fatigue 06/22/2019   Past Medical History:  Diagnosis Date  . Anemia   . Anxiety   . Arthritis   . Asthma   . Bipolar 2 disorder ([place])   . Complication of anesthesia    hard to put to sleep  . Depression   . Family history of adverse reaction to anesthesia    Mother is hard to wake up  . Fibromyalgia   . Hypertension   . Hypoglycemia following gastrointestinal surgery    post-bariatric surgery hypoglycemia (followed at [facility])  . Lung nodules    lung nodules ~1999; diagnosed wtih "Legionnaires", reported follow-up imaging showed nodules had resolved but some "scar tissue" present  . Migraine aura, persistent   . 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 (Hematologist Dr. Sterling Big, MD at [facility])  . Pneumonia   . Undifferentiated connective tissue disease ([place])    [facility]    No family history on file.  Past Surgical History:  Procedure Laterality Date  . ABDOMINAL HYSTERECTOMY    . CESAREAN SECTION    . ESOPHAGOGASTRODUODENOSCOPY    . FOOT SURGERY Bilateral   . GASTRIC BYPASS    . HERNIA REPAIR    . HIP SURGERY    . KNEE SURGERY Bilateral   . LAPAROSCOPIC OOPHERECTOMY    . SHOULDER SURGERY Bilateral   . TONSILLECTOMY    . TOTAL KNEE ARTHROPLASTY Right 08/22/2020   Procedure: RIGHT TOTAL KNEE ARTHROPLASTY;  Surgeon: Leandrew Koyanagi, MD;  Location: [place];  Service: Orthopedics;  Laterality: Right;  . TUBAL LIGATION     Social History   Occupational History  . Not on file  Tobacco Use  . Smoking status: Never Smoker  . Smokeless tobacco: Never Used  Vaping Use  . Vaping Use: Never used  Substance and Sexual Activity  . Alcohol use: Yes    Comment: socially  . Drug use: No  . Sexual activity: Not on file

## 2021-02-17 NOTE — Telephone Encounter (Signed)
Noted  

## 2021-03-09 ENCOUNTER — Telehealth: Payer: Self-pay

## 2021-03-09 NOTE — Telephone Encounter (Signed)
Tried submitting for SynviscOne,but portal is down.  Will resubmit.

## 2021-03-17 ENCOUNTER — Telehealth: Payer: Self-pay

## 2021-03-17 NOTE — Telephone Encounter (Signed)
VOB has been submitted for SynviscOne, left knee. Pending BV. 

## 2021-03-20 ENCOUNTER — Telehealth: Payer: BC Managed Care – PPO | Admitting: Orthopaedic Surgery

## 2021-03-20 NOTE — Telephone Encounter (Signed)
Called and left a Vm for patient to return my call concerning gel injections.

## 2021-03-20 NOTE — Telephone Encounter (Signed)
Patient called needing a call back concerning the approval for the gel injection. Patient said she has been waiting since her appointment in May to get the injection. The number to contact  patient is [phone]

## 2021-03-23 ENCOUNTER — Ambulatory Visit: Payer: BC Managed Care – PPO | Admitting: Orthopaedic Surgery

## 2021-03-23 ENCOUNTER — Encounter: Payer: Self-pay | Admitting: Orthopaedic Surgery

## 2021-03-23 ENCOUNTER — Telehealth: Payer: Self-pay

## 2021-03-23 DIAGNOSIS — M1712 Unilateral primary osteoarthritis, left knee: Secondary | ICD-10-CM | POA: Diagnosis not present

## 2021-03-23 MED ORDER — BUPIVACAINE HCL 0.5 % IJ SOLN
2.0000 mL | INTRAMUSCULAR | Status: AC | PRN
  Administered 2021-03-23: 2 mL via INTRA_ARTICULAR

## 2021-03-23 MED ORDER — METHYLPREDNISOLONE ACETATE 40 MG/ML IJ SUSP
40.0000 mg | INTRAMUSCULAR | Status: AC | PRN
  Administered 2021-03-23: 40 mg via INTRA_ARTICULAR

## 2021-03-23 MED ORDER — LIDOCAINE HCL 1 % IJ SOLN
2.0000 mL | INTRAMUSCULAR | Status: AC | PRN
Start: 2021-03-23 — End: 2021-03-23
  Administered 2021-03-23: 2 mL

## 2021-03-23 NOTE — Telephone Encounter (Signed)
Patient called stating that she is in a lot of pain with her left knee.  Stated that she received a cortisone injection this morning.  Would like a response through mychart and a phone call?  Cb# [phone].  Please advise.  Thank you.

## 2021-03-23 NOTE — Telephone Encounter (Signed)
15% of people who get a cortisone shot can get a post injection flare up which will resolve in 24 hrs or less.  She should take it easy and use ice and take tylenol.

## 2021-03-23 NOTE — Progress Notes (Signed)
Office Visit Note   Patient: Kimberly Herring           Date of Birth: [birth year]           MRN: [id number] Visit Date: 03/23/2021              Requested by: Verdie Shire, MD [address],  [place] [id number] PCP: Verdie Shire, MD   Assessment & Plan: Visit Diagnoses:  1. Primary osteoarthritis of left knee     Plan: Left knee was injected with cortisone today.  She tolerated this well.  We will see her back as needed.  Follow-Up Instructions: No follow-ups on file.   Orders:  No orders of the defined types were placed in this encounter.  No orders of the defined types were placed in this encounter.     Procedures: Large Joint Inj: L knee on 03/23/2021 9:32 AM Details: 22 G needle Medications: 2 mL bupivacaine 0.5 %; 2 mL lidocaine 1 %; 40 mg methylPREDNISolone acetate 40 MG/ML Outcome: tolerated well, no immediate complications Patient was prepped and draped in the usual sterile fashion.      Clinical Data: No additional findings.   Subjective: Chief Complaint  Patient presents with   Right Knee - Pain    Kimberly Herring comes in today for left knee DJD.  Requesting cortisone injection.  Gel injection is still pending approval.   Review of Systems   Objective: Vital Signs: There were no vitals taken for this visit.  Physical Exam  Ortho Exam Left knee exam shows patellofemoral crepitus and pain with range of motion.  No joint effusion. Specialty Comments:  No specialty comments available.  Imaging: No results found.   PMFS History: Patient Active Problem List   Diagnosis Date Noted   Status post total knee replacement, right 08/22/2020   Primary osteoarthritis of right knee 08/21/2020   Leg edema 06/22/2019   Essential hypertension 06/22/2019   Fatigue 06/22/2019   Past Medical History:  Diagnosis Date   Anemia    Anxiety    Arthritis    Asthma    Bipolar 2 disorder ([place])    Complication of  anesthesia    hard to put to sleep   Depression    Family history of adverse reaction to anesthesia    Mother is hard to wake up   Fibromyalgia    Hypertension    Hypoglycemia following gastrointestinal surgery    post-bariatric surgery hypoglycemia (followed at [facility])   Lung nodules    lung nodules ~1999; diagnosed wtih "Legionnaires", reported follow-up imaging showed nodules had resolved but some "scar tissue" present   Migraine aura, persistent    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 (Hematologist Dr. 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 No pertinent family history.  Past Surgical History:  Procedure Laterality Date   ABDOMINAL HYSTERECTOMY     CESAREAN SECTION     ESOPHAGOGASTRODUODENOSCOPY     FOOT SURGERY Bilateral    GASTRIC BYPASS     HERNIA REPAIR     HIP SURGERY     KNEE SURGERY Bilateral    LAPAROSCOPIC OOPHERECTOMY     SHOULDER SURGERY Bilateral    TONSILLECTOMY     TOTAL KNEE ARTHROPLASTY Right 08/22/2020   Procedure: RIGHT TOTAL KNEE ARTHROPLASTY;  Surgeon: Frankey Shown  M, MD;  Location: [place];  Service: Orthopedics;  Laterality: Right;   TUBAL LIGATION     Social History   Occupational History   Not on file  Tobacco Use   Smoking status: Never   Smokeless tobacco: Never  Vaping Use   Vaping Use: Never used  Substance and Sexual Activity   Alcohol use: Yes    Comment: socially   Drug use: No   Sexual activity: Not on file

## 2021-03-24 ENCOUNTER — Telehealth: Payer: Self-pay

## 2021-03-24 NOTE — Telephone Encounter (Signed)
My chart message sent to pt. Advising

## 2021-03-24 NOTE — Telephone Encounter (Signed)
Talked with patient and advised her that [facility] does not cover any gel injections.  Advised patient about TriVisc, which is a series that cost $250.00 OOP.  Patient stated that she will give [place] a call back once she is ready to proceed with trying TriVisc.

## 2021-03-29 NOTE — Telephone Encounter (Signed)
I can send in tramadol or Tylenol with codeine if she would like to try either of those.

## 2021-03-30 ENCOUNTER — Other Ambulatory Visit: Payer: Self-pay | Admitting: Physician Assistant

## 2021-03-30 MED ORDER — TRAMADOL HCL 50 MG PO TABS: 50.0000 mg | ORAL_TABLET | Freq: Three times a day (TID) | ORAL | 0 refills | Status: AC | PRN

## 2021-03-30 NOTE — Telephone Encounter (Signed)
Just sent

## 2021-05-14 ENCOUNTER — Encounter (HOSPITAL_COMMUNITY): Payer: Self-pay | Admitting: Emergency Medicine

## 2021-05-14 ENCOUNTER — Emergency Department (HOSPITAL_COMMUNITY): Payer: BC Managed Care – PPO

## 2021-05-14 ENCOUNTER — Other Ambulatory Visit: Payer: Self-pay

## 2021-05-14 ENCOUNTER — Emergency Department (HOSPITAL_COMMUNITY)
Admission: EM | Admit: 2021-05-14 | Discharge: 2021-05-14 | Disposition: A | Discharge status: Home / Self Care | Payer: BC Managed Care – PPO | Attending: Emergency Medicine | Admitting: Emergency Medicine

## 2021-05-14 DIAGNOSIS — J45909 Unspecified asthma, uncomplicated: Secondary | ICD-10-CM | POA: Insufficient documentation

## 2021-05-14 DIAGNOSIS — M25561 Pain in right knee: Secondary | ICD-10-CM

## 2021-05-14 DIAGNOSIS — Z96651 Presence of right artificial knee joint: Secondary | ICD-10-CM | POA: Diagnosis not present

## 2021-05-14 DIAGNOSIS — I10 Essential (primary) hypertension: Secondary | ICD-10-CM | POA: Insufficient documentation

## 2021-05-14 DIAGNOSIS — Z79899 Other long term (current) drug therapy: Secondary | ICD-10-CM | POA: Diagnosis not present

## 2021-05-14 MED ORDER — KETOROLAC TROMETHAMINE 30 MG/ML IJ SOLN: 30.0000 mg | Freq: Once | INTRAMUSCULAR | Status: DC

## 2021-05-14 MED ORDER — FENTANYL CITRATE PF 50 MCG/ML IJ SOSY
100.0000 ug | PREFILLED_SYRINGE | Freq: Once | INTRAMUSCULAR | Status: AC
  Administered 2021-05-14: 100 ug via INTRAMUSCULAR
  Filled 2021-05-14: qty 2

## 2021-05-14 MED ORDER — LIDOCAINE 5 % EX PTCH: 1.0000 | MEDICATED_PATCH | CUTANEOUS | 0 refills | Status: AC

## 2021-05-14 NOTE — ED Notes (Signed)
Patient given discharge instructions, all questions answered. Patient in possession of all belongings, directed to the discharge area  

## 2021-05-14 NOTE — ED Triage Notes (Signed)
Pt reports right knee pain that started 5 days ago. PT stating that she felt something pop in her knee. Pt reports she is here for pain management. She is applying buprenorphine patch daily.

## 2021-05-14 NOTE — ED Provider Notes (Signed)
[facility] EMERGENCY DEPARTMENT Provider Note   CSN: [id number] Arrival date & time: 05/14/21  1446     History Chief Complaint  Patient presents with   Knee Pain    Kimberly Herring is a 55 y.o. female.   Knee Pain Associated symptoms: no fatigue and no fever    Patient presents with right knee pain x4 days.  Happened when she got out of bed in the morning, pain has been consistent since then.  Not significantly worsening or improving, she is try Tylenol and buprenorphine patches with minimal relief.  Status post arthroplasty 2 years ago, followed by Dr. Tomie China with orthopedics.  No new fevers, no new accidents or trauma to the knee.  No significant leg swelling, no fever.  No history of gout.   She ambulates with assistance of a cane, states this is routine for her.  Past Medical History:  Diagnosis Date   Anemia    Anxiety    Arthritis    Asthma    Bipolar 2 disorder ([place])    Complication of anesthesia    hard to put to sleep   Depression    Family history of adverse reaction to anesthesia    Mother is hard to wake up   Fibromyalgia    Hypertension    Hypoglycemia following gastrointestinal surgery    post-bariatric surgery hypoglycemia (followed at [facility])   Lung nodules    lung nodules ~1999; diagnosed wtih "Legionnaires", reported follow-up imaging showed nodules had resolved but some "scar tissue" present   Migraine aura, persistent    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 (Hematologist Dr. 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[facility])    [facility] Rheumatology    Patient Active Problem List   Diagnosis Date Noted   Status post total knee replacement, right 08/22/2020   Primary osteoarthritis of right knee 08/21/2020   Leg edema 06/22/2019   Essential hypertension 06/22/2019   Fatigue 06/22/2019    Past Surgical History:  Procedure Laterality Date    ABDOMINAL HYSTERECTOMY     CESAREAN SECTION     ESOPHAGOGASTRODUODENOSCOPY     FOOT SURGERY Bilateral    GASTRIC BYPASS     HERNIA REPAIR     HIP SURGERY     KNEE SURGERY Bilateral    LAPAROSCOPIC OOPHERECTOMY     SHOULDER SURGERY Bilateral    TONSILLECTOMY     TOTAL KNEE ARTHROPLASTY Right 08/22/2020   Procedure: RIGHT TOTAL KNEE ARTHROPLASTY;  Surgeon: Leandrew Koyanagi, MD;  Location: [place];  Service: Orthopedics;  Laterality: Right;   TUBAL LIGATION       OB History   No obstetric history on file.     No family history on file.  Social History   Tobacco Use   Smoking status: Never   Smokeless tobacco: Never  Vaping Use   Vaping Use: Never used  Substance Use Topics   Alcohol use: Yes    Comment: socially   Drug use: No    Home Medications Prior to Admission medications   Medication Sig Start Date End Date Taking? Authorizing Provider  acarbose (PRECOSE) 25 MG tablet Take 50 mg by mouth 3 (three) times daily with meals. 06/07/20   [provider]  albuterol (PROVENTIL HFA;VENTOLIN HFA) 108 (90 BASE) MCG/ACT inhaler Inhale 2 puffs into the lungs every 6 (six) hours as needed for wheezing or  shortness of breath.    [provider]  Asenapine Maleate (SAPHRIS) 10 MG SUBL Place 10 mg under the tongue at bedtime.    [provider]  buprenorphine Haze Rushing) 15 MCG/HR Place 1 patch onto the skin every Sunday.     [provider]  Calcium Carb-Cholecalciferol (CALCIUM 500 + D PO) Take 1 tablet by mouth daily.    [provider]  cephALEXin (KEFLEX) 500 MG capsule Take 1 capsule (500 mg total) by mouth every 6 (six) hours. 08/24/20   Aundra Dubin, PA-C  cetirizine (ZYRTEC) 10 MG tablet Take 10 mg by mouth at bedtime.     [provider]  desipramine (NORPRAMIN) 50 MG tablet Take 50 mg by mouth at bedtime.     [provider]  diclofenac Sodium (VOLTAREN) 1 % GEL Apply 1 application topically 2 (two) times daily  as needed (pain).    [provider]  dicyclomine (BENTYL) 10 MG capsule Take 10 mg by mouth 3 (three) times daily.     [provider]  docusate sodium (COLACE) 100 MG capsule Take 1 capsule (100 mg total) by mouth daily as needed. 08/23/20 08/23/21  Aundra Dubin, PA-C  DULoxetine (CYMBALTA) 60 MG capsule Take 120 mg by mouth at bedtime.     [provider]  fluticasone (FLONASE) 50 MCG/ACT nasal spray Place 1 spray into both nostrils 2 (two) times daily. Patient taking differently: Place 2 sprays into both nostrils 2 (two) times daily.  04/04/15   Betancourt, Aura Fey, NP  fluticasone-salmeterol (ADVAIR HFA) 115-21 MCG/ACT inhaler Inhale 2 puffs into the lungs 2 (two) times daily as needed (shortness of breath).     [provider]  folic acid (FOLVITE) 1 MG tablet Take 1 mg by mouth 2 (two) times daily.     [provider]  gabapentin (NEURONTIN) 800 MG tablet Take 800 mg by mouth 3 (three) times daily.    [provider]  Galcanezumab-gnlm (EMGALITY) 120 MG/ML SOSY Inject 120 mg into the skin every 28 (twenty-eight) days.    [provider]  Glucagon 3 MG/DOSE POWD Place 1 spray into the nose as needed (hypoglycemia).    [provider]  HYDROcodone-acetaminophen (NORCO) 7.5-325 MG tablet Take 1-2 tablets by mouth every 6 (six) hours as needed for moderate pain. To be taken after surgery 08/23/20   Aundra Dubin, PA-C  hydroxychloroquine (PLAQUENIL) 200 MG tablet Take 200 mg by mouth daily.     [provider]  hydrOXYzine (ATARAX/VISTARIL) 25 MG tablet Take 25 mg by mouth 3 (three) times daily as needed (migraines/headaches).     [provider]  lamoTRIgine (LAMICTAL) 200 MG tablet Take 200 mg by mouth 2 (two) times daily. 06/01/20   [provider]  latanoprost (XALATAN) 0.005 % ophthalmic solution Place 1 drop into both eyes at bedtime.     [provider]  linaclotide (LINZESS) 290  MCG CAPS capsule Take 290 mcg by mouth daily as needed (constipation).     [provider]  Methotrexate 25 MG/ML SOSY Inject 15 mg into the skin every Sunday.    [provider]  Multiple Vitamin (MULTIVITAMIN) tablet Take 1 tablet by mouth daily.    [provider]  omeprazole (PRILOSEC) 40 MG capsule Take 40 mg by mouth 2 (two) times daily. 07/16/20   [provider]  ondansetron (ZOFRAN) 4 MG tablet Take 1 tablet (4 mg total) by mouth every 8 (eight) hours as needed  for nausea or vomiting. To be taken after surgery 08/23/20   Aundra Dubin, PA-C  ondansetron (ZOFRAN-ODT) 4 MG disintegrating tablet Take 4 mg by mouth every 8 (eight) hours as needed for nausea or vomiting.    [provider]  Probiotic Product (PROBIOTIC PO) Take 1 capsule by mouth daily.    [provider]  tiZANidine (ZANAFLEX) 4 MG tablet Take 12 mg by mouth at bedtime.    [provider]  topiramate (TOPAMAX) 25 MG tablet Take 25-100 mg by mouth See admin instructions. Take 25 mg in the morning and 100 mg at night    [provider]  traMADol (ULTRAM) 50 MG tablet Take 1 tablet (50 mg total) by mouth 3 (three) times daily as needed. 03/30/21   Aundra Dubin, PA-C  tranexamic acid (LYSTEDA) 650 MG TABS tablet Take 1,300 mg by mouth See admin instructions. Take 1300 mg 3 times daily start the day before procedure, taking for a total of 5 days then stop    [provider]  verapamil (CALAN-SR) 120 MG CR tablet Take 120 mg by mouth 2 (two) times daily.    [provider]    Allergies    Bactrim [sulfamethoxazole-trimethoprim], Metronidazole, Ambien [zolpidem], Depakene [valproic acid], Lyrica [pregabalin], Nickel, Nsaids, Percocet [oxycodone-acetaminophen], Compazine [prochlorperazine], and Contrave [naltrexone-bupropion hcl er]  Review of Systems   Review of Systems  Constitutional:  Negative for fatigue and fever.  Musculoskeletal:   Positive for arthralgias. Negative for gait problem, joint swelling and myalgias.   Physical Exam Updated Vital Signs BP 133/87   Pulse 81   Temp 98.1 F (36.7 C) (Oral)   Resp 17   SpO2 100%   Physical Exam Vitals and nursing note reviewed. Exam conducted with a chaperone present.  Constitutional:      General: She is not in acute distress.    Appearance: Normal appearance. She is obese.  HENT:     Head: Normocephalic and atraumatic.  Eyes:     General: No scleral icterus.    Extraocular Movements: Extraocular movements intact.     Pupils: Pupils are equal, round, and reactive to light.  Cardiovascular:     Pulses: Normal pulses.     Comments: DP and PT 2+ bilaterally. Musculoskeletal:        General: Tenderness present. No swelling. Normal range of motion.     Comments: Some pain elicited with flexion and extension of the knee, no effusions or skin coloration changes.  Patient is able to ambulate.   Skin:    Coloration: Skin is not jaundiced.  Neurological:     General: No focal deficit present.     Mental Status: She is alert and oriented to person, place, and time. Mental status is at baseline.     Coordination: Coordination normal.    ED Results / Procedures / Treatments   Labs (all labs ordered are listed, but only abnormal results are displayed) Labs Reviewed - No data to display  EKG None  Radiology DG Knee Complete 4 Views Right  Result Date: 05/14/2021 CLINICAL DATA:  Right knee pain. EXAM: RIGHT KNEE - COMPLETE 4+ VIEW COMPARISON:  02/14/2021 FINDINGS: Right knee arthroplasty is seen in expected position. No evidence of fracture or dislocation. No other osseous abnormality identified. No evidence of knee joint effusion. IMPRESSION: Right knee arthroplasty. No acute findings. Electronically Signed   By: Marlaine Hind M.D.   On: 05/14/2021 15:50    Procedures Procedures   Medications Ordered  in ED Medications  fentaNYL (SUBLIMAZE) injection 100 mcg (has  no administration in time range)    ED Course  I have reviewed the triage vital signs and the nursing notes.  Pertinent labs & imaging results that were available during my care of the patient were reviewed by me and considered in my medical decision making (see chart for details).    MDM Rules/Calculators/A&P                           Patient vitals are stable, no fever.  Radiograph was ordered, no acute findings such as fracture or dislocation of the patella.  I doubt septic joint given she is not particularly tender to touch.  She is able to ambulate additionally.  Suspect suspect this is a flareup of musculoskeletal pain, will give her a shot of pain medicine for relief and have her follow-up with orthopedic surgery.  She is unfortunately unable and unwilling to take anti-inflammatory medicine due to history of platelet dysfunction, I have advised her to continue with Tylenol and use lidocaine patches as needed for pain.  Return precautions given, the patient is appropriate discharge at this time.  No concern about her not being able to ambulate, no signs of septic joint.  Final Clinical Impression(s) / ED Diagnoses Final diagnoses:  None    Rx / DC Orders ED Discharge Orders     None        Sherrill Raring, Hershal Coria 05/14/21 1621    Milton Ferguson, MD 05/15/21 1538

## 2021-05-14 NOTE — ED Notes (Signed)
Patient has a history if right knee pain that she is followed by orthopedics for, states she hasn't had to use a cane for 6 months but she's back to using it now.

## 2021-05-14 NOTE — Discharge Instructions (Addendum)
Continue taking Tylenol as needed for the pain.  You may also use the Lidoderm patches as prescribed if they work better than the buprenorphine patches.  I also would advise following up with the orthopedic physician who did your knee surgery in the next week if pain does not improve.  You may continue stretching the knee as that might improve healing.

## 2021-08-22 ENCOUNTER — Ambulatory Visit (INDEPENDENT_AMBULATORY_CARE_PROVIDER_SITE_OTHER): Payer: BC Managed Care – PPO | Admitting: Orthopaedic Surgery

## 2021-08-22 ENCOUNTER — Ambulatory Visit: Payer: Self-pay

## 2021-08-22 ENCOUNTER — Other Ambulatory Visit: Payer: Self-pay

## 2021-08-22 VITALS — Ht 62.5 in | Wt 234.0 lb

## 2021-08-22 DIAGNOSIS — Z96651 Presence of right artificial knee joint: Secondary | ICD-10-CM | POA: Diagnosis not present

## 2021-08-22 DIAGNOSIS — M1712 Unilateral primary osteoarthritis, left knee: Secondary | ICD-10-CM | POA: Diagnosis not present

## 2021-08-22 NOTE — Progress Notes (Signed)
Office Visit Note   Patient: Kimberly Herring           Date of Birth: 12/09/1965           MRN: [id number] Visit Date: 08/22/2021              Requested by: Verdie Shire, MD [address],  [place] [address] PCP: Verdie Shire, MD   Assessment & Plan: Visit Diagnoses:  1. History of total knee replacement, right   2. Unilateral primary osteoarthritis, left knee     Plan: Impression is 1 year status post right total knee replacement and left knee degenerative joint disease.  In regards to the right knee, she is doing well.  I think her pain is likely from favoring this knee due to the left knee pain.  Dental prophylaxis reinforced.  Follow-up in 1 year for repeat evaluation and x-rays.  In regards to the left knee, we have discussed to repeat cortisone/viscosupplementation injections versus total knee arthroplasty.  She is not interested in injections at this point would like to proceed with definitive treatment of total knee arthroplasty.  We have discussed risks, benefits and possible complications and increased risk of complication given her BMI of over 40.  She would still like to proceed.  She does have a bleeding disorder so we will need to coordinate preop platelet transfusion with Dr. Gaylyn Cheers at [facility].  Follow-Up Instructions: Return in about 1 year (around 08/22/2022).   Orders:  Orders Placed This Encounter  Procedures   XR KNEE 3 VIEW LEFT   XR Knee 1-2 Views Right    No orders of the defined types were placed in this encounter.     Procedures: No procedures performed   Clinical Data: No additional findings.   Subjective: Chief Complaint  Patient presents with   Left Knee - Pain    HPI patient is a pleasant 55 year old female who comes in today with recurrent left knee pain.  History of advanced degenerative joint disease.  She has seen [place] in the past for cortisone as well as viscosupplementation injections.  The  most relief she gets is from cortisone injections which typically last about 6 weeks.  Her pain is recently returned and has started to worsen.  Pain is worse with activity.  She is taking Tylenol without significant relief.  At this point, she is ready to proceed with total knee arthroplasty.  She is also 1 year out right total knee replacement doing well in regards to her right knee but has noticed increased pain which she thinks may be from favoring this due to left knee pain.  Review of Systems as detailed in HPI.  All others reviewed and are negative.   Objective: Vital Signs: Ht 5' 2.5" (1.588 m)   Wt 234 lb (106.1 kg)   BMI 42.12 kg/m   Physical Exam well-developed well-nourished female no acute distress.  Alert and oriented x3.  Ortho Exam left knee exam shows range of motion from 0 to 100 degrees.  Medial joint line tenderness.  Ligaments are stable.  She is neurovascular tact distally.  Right knee exam shows range of motion from 0 to 100 degrees.  Stable vascular stress.  She is neurovascular tact distally.  Specialty Comments:  No specialty comments available.  Imaging: XR Knee 1-2 Views Right  Result Date: 08/22/2021 Well-seated prosthesis without complication  XR KNEE 3 VIEW LEFT  Result Date: 08/22/2021 X-rays demonstrate moderate degenerative changes  medial and patellofemoral compartments.    PMFS History: Patient Active Problem List   Diagnosis Date Noted   Status post total knee replacement, right 08/22/2020   Primary osteoarthritis of right knee 08/21/2020   Leg edema 06/22/2019   Essential hypertension 06/22/2019   Fatigue 06/22/2019   Past Medical History:  Diagnosis Date   Anemia    Anxiety    Arthritis    Asthma    Bipolar 2 disorder ([place])    Complication of anesthesia    hard to put to sleep   Depression    Family history of adverse reaction to anesthesia    Mother is hard to wake up   Fibromyalgia    Hypertension    Hypoglycemia following  gastrointestinal surgery    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 (Hematologist Dr. 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  Past Surgical History:  Procedure Laterality Date   ABDOMINAL HYSTERECTOMY     CESAREAN SECTION     ESOPHAGOGASTRODUODENOSCOPY     FOOT SURGERY Bilateral    GASTRIC BYPASS     HERNIA REPAIR     HIP SURGERY     KNEE SURGERY Bilateral    LAPAROSCOPIC OOPHERECTOMY     SHOULDER SURGERY Bilateral    TONSILLECTOMY     TOTAL KNEE ARTHROPLASTY Right 08/22/2020   Procedure: RIGHT TOTAL KNEE ARTHROPLASTY;  Surgeon: Leandrew Koyanagi, MD;  Location: [place];  Service: Orthopedics;  Laterality: Right;   TUBAL LIGATION     Social History   Occupational History   Not on file  Tobacco Use   Smoking status: Never   Smokeless tobacco: Never  Vaping Use   Vaping Use: Never used  Substance and Sexual Activity   Alcohol use: Yes    Comment: socially   Drug use: No   Sexual activity: Not on file

## 2024-01-24 ENCOUNTER — Telehealth: Payer: Self-pay | Admitting: Orthopaedic Surgery

## 2024-01-24 NOTE — Telephone Encounter (Signed)
 Patient called and wanted to know when he did the knee surgery did she have nickle in it. She is getting her other knee done in [place]  and the doctor ask was nickle in her knee. CB#[phone]

## 2024-01-26 NOTE — Telephone Encounter (Signed)
 I used a nickel free zimmer persona.

## 2024-01-27 NOTE — Telephone Encounter (Signed)
 Called and left detailed voicemail with information.

## 2024-07-27 ENCOUNTER — Encounter: Payer: Self-pay | Admitting: Radiology
